# Patient Record
Sex: Male | Born: 1955
Health system: Southern US, Community
[De-identification: ages and names within clinical notes are randomized; demographics above are authoritative.]

## PROBLEM LIST (undated history)

## (undated) DIAGNOSIS — F411 Generalized anxiety disorder: Secondary | ICD-10-CM

## (undated) DIAGNOSIS — E785 Hyperlipidemia, unspecified: Secondary | ICD-10-CM

## (undated) DIAGNOSIS — F17200 Nicotine dependence, unspecified, uncomplicated: Secondary | ICD-10-CM

## (undated) DIAGNOSIS — K219 Gastro-esophageal reflux disease without esophagitis: Secondary | ICD-10-CM

## (undated) DIAGNOSIS — M549 Dorsalgia, unspecified: Secondary | ICD-10-CM

## (undated) DIAGNOSIS — L4050 Arthropathic psoriasis, unspecified: Secondary | ICD-10-CM

## (undated) DIAGNOSIS — D369 Benign neoplasm, unspecified site: Secondary | ICD-10-CM

## (undated) DIAGNOSIS — R739 Hyperglycemia, unspecified: Secondary | ICD-10-CM

## (undated) DIAGNOSIS — Z86718 Personal history of other venous thrombosis and embolism: Secondary | ICD-10-CM

## (undated) DIAGNOSIS — I509 Heart failure, unspecified: Secondary | ICD-10-CM

## (undated) DIAGNOSIS — Z7289 Other problems related to lifestyle: Secondary | ICD-10-CM

## (undated) DIAGNOSIS — J309 Allergic rhinitis, unspecified: Secondary | ICD-10-CM

## (undated) DIAGNOSIS — N2889 Other specified disorders of kidney and ureter: Secondary | ICD-10-CM

## (undated) DIAGNOSIS — Z86010 Personal history of colonic polyps: Secondary | ICD-10-CM

## (undated) DIAGNOSIS — M5137 Other intervertebral disc degeneration, lumbosacral region: Secondary | ICD-10-CM

## (undated) DIAGNOSIS — R21 Rash and other nonspecific skin eruption: Secondary | ICD-10-CM

## (undated) DIAGNOSIS — I10 Essential (primary) hypertension: Secondary | ICD-10-CM

## (undated) DIAGNOSIS — L408 Other psoriasis: Secondary | ICD-10-CM

## (undated) DIAGNOSIS — Z85528 Personal history of other malignant neoplasm of kidney: Secondary | ICD-10-CM

## (undated) HISTORY — DX: Gastro-esophageal reflux disease without esophagitis: K21.9

## (undated) HISTORY — DX: Allergic rhinitis, unspecified: J30.9

## (undated) HISTORY — PX: OTHER SURGICAL HISTORY: SHX169

## (undated) HISTORY — DX: Generalized anxiety disorder: F41.1

## (undated) HISTORY — PX: WISDOM TOOTH EXTRACTION: SHX21

## (undated) HISTORY — DX: Dorsalgia, unspecified: M54.9

## (undated) HISTORY — DX: Rash and other nonspecific skin eruption: R21

## (undated) HISTORY — DX: Hyperlipidemia, unspecified: E78.5

## (undated) HISTORY — DX: Hyperglycemia, unspecified: R73.9

## (undated) HISTORY — DX: Benign neoplasm, unspecified site: D36.9

## (undated) HISTORY — DX: Essential (primary) hypertension: I10

## (undated) HISTORY — DX: Personal history of other venous thrombosis and embolism: Z86.718

## (undated) HISTORY — DX: Other intervertebral disc degeneration, lumbosacral region: M51.37

## (undated) HISTORY — DX: Personal history of colonic polyps: Z86.010

## (undated) HISTORY — DX: Other problems related to lifestyle: Z72.89

## (undated) HISTORY — DX: Other psoriasis: L40.8

## (undated) HISTORY — DX: Nicotine dependence, unspecified, uncomplicated: F17.200

---

## 2005-03-30 ENCOUNTER — Ambulatory Visit: Payer: Self-pay | Admitting: Internal Medicine

## 2005-03-30 ENCOUNTER — Ambulatory Visit (HOSPITAL_COMMUNITY): Admission: RE | Admit: 2005-03-30 | Discharge: 2005-03-30 | Payer: Self-pay | Admitting: Internal Medicine

## 2005-04-12 ENCOUNTER — Ambulatory Visit: Payer: Self-pay

## 2005-04-15 ENCOUNTER — Ambulatory Visit: Payer: Self-pay | Admitting: Internal Medicine

## 2005-06-09 ENCOUNTER — Emergency Department (HOSPITAL_COMMUNITY): Admission: EM | Admit: 2005-06-09 | Discharge: 2005-06-09 | Payer: Self-pay | Admitting: Emergency Medicine

## 2007-06-21 ENCOUNTER — Ambulatory Visit: Payer: Self-pay | Admitting: Internal Medicine

## 2007-06-21 LAB — CONVERTED CEMR LAB
ALT: 37 units/L (ref 0–53)
AST: 30 units/L (ref 0–37)
Albumin: 4 g/dL (ref 3.5–5.2)
Alkaline Phosphatase: 97 units/L (ref 39–117)
BUN: 15 mg/dL (ref 6–23)
Basophils Absolute: 0.1 10*3/uL (ref 0.0–0.1)
Basophils Relative: 1.1 % — ABNORMAL HIGH (ref 0.0–1.0)
Bilirubin Urine: NEGATIVE
Bilirubin, Direct: 0.1 mg/dL (ref 0.0–0.3)
CO2: 29 meq/L (ref 19–32)
Calcium: 9.9 mg/dL (ref 8.4–10.5)
Chloride: 107 meq/L (ref 96–112)
Cholesterol: 188 mg/dL (ref 0–200)
Creatinine, Ser: 0.8 mg/dL (ref 0.4–1.5)
Eosinophils Absolute: 0.1 10*3/uL (ref 0.0–0.6)
Eosinophils Relative: 1.6 % (ref 0.0–5.0)
GFR calc Af Amer: 132 mL/min
GFR calc non Af Amer: 109 mL/min
Glucose, Bld: 100 mg/dL — ABNORMAL HIGH (ref 70–99)
HCT: 42.8 % (ref 39.0–52.0)
HDL: 57.9 mg/dL (ref >39.0)
Hemoglobin, Urine: NEGATIVE
Hemoglobin: 15.3 g/dL (ref 13.0–17.0)
Ketones, ur: NEGATIVE mg/dL
LDL Cholesterol: 115 mg/dL — ABNORMAL HIGH (ref 0–99)
Leukocytes, UA: NEGATIVE
Lymphocytes Relative: 29.3 % (ref 12.0–46.0)
MCHC: 35.8 g/dL (ref 30.0–36.0)
MCV: 92.4 fL (ref 78.0–100.0)
Monocytes Absolute: 0.5 10*3/uL (ref 0.2–0.7)
Monocytes Relative: 8.7 % (ref 3.0–11.0)
Neutro Abs: 3.8 10*3/uL (ref 1.4–7.7)
Neutrophils Relative %: 59.3 % (ref 43.0–77.0)
Nitrite: NEGATIVE
PSA: 0.57 ng/mL
PSA: 0.57 ng/mL (ref 0.10–4.00)
Platelets: 235 10*3/uL (ref 150–400)
Potassium: 5.1 meq/L (ref 3.5–5.1)
RBC: 4.63 M/uL (ref 4.22–5.81)
RDW: 11.6 % (ref 11.5–14.6)
Sodium: 141 meq/L (ref 135–145)
Specific Gravity, Urine: >=1.03 (ref 1.000–1.03)
TSH: 1.76 microintl units/mL (ref 0.35–5.50)
Total Bilirubin: 0.7 mg/dL (ref 0.3–1.2)
Total CHOL/HDL Ratio: 3.2
Total Protein, Urine: NEGATIVE mg/dL
Total Protein: 6.7 g/dL (ref 6.0–8.3)
Triglycerides: 74 mg/dL (ref 0–149)
Urine Glucose: NEGATIVE mg/dL
Urobilinogen, UA: 0.2 (ref 0.0–1.0)
VLDL: 15 mg/dL (ref 0–40)
WBC: 6.3 10*3/uL (ref 4.5–10.5)
pH: 5.5 (ref 5.0–8.0)

## 2007-08-29 ENCOUNTER — Ambulatory Visit: Payer: Self-pay | Admitting: Internal Medicine

## 2007-09-10 ENCOUNTER — Encounter: Payer: Self-pay | Admitting: Internal Medicine

## 2007-09-10 DIAGNOSIS — Z86718 Personal history of other venous thrombosis and embolism: Secondary | ICD-10-CM

## 2007-09-10 DIAGNOSIS — F411 Generalized anxiety disorder: Secondary | ICD-10-CM

## 2007-09-10 DIAGNOSIS — J309 Allergic rhinitis, unspecified: Secondary | ICD-10-CM

## 2007-09-10 DIAGNOSIS — G4700 Insomnia, unspecified: Secondary | ICD-10-CM | POA: Insufficient documentation

## 2007-09-10 HISTORY — DX: Allergic rhinitis, unspecified: J30.9

## 2007-09-10 HISTORY — DX: Personal history of other venous thrombosis and embolism: Z86.718

## 2007-09-10 HISTORY — DX: Generalized anxiety disorder: F41.1

## 2008-01-18 ENCOUNTER — Ambulatory Visit: Payer: Self-pay | Admitting: Internal Medicine

## 2008-01-28 ENCOUNTER — Encounter: Payer: Self-pay | Admitting: Internal Medicine

## 2008-01-29 ENCOUNTER — Ambulatory Visit: Payer: Self-pay | Admitting: Internal Medicine

## 2008-01-29 ENCOUNTER — Encounter: Payer: Self-pay | Admitting: Internal Medicine

## 2008-01-29 DIAGNOSIS — Z860100 Personal history of colon polyps, unspecified: Secondary | ICD-10-CM | POA: Insufficient documentation

## 2008-01-29 HISTORY — DX: Personal history of colonic polyps: Z86.010

## 2008-01-29 HISTORY — DX: Personal history of colon polyps, unspecified: Z86.0100

## 2008-11-22 HISTORY — PX: ROTATOR CUFF REPAIR: SHX139

## 2009-04-23 ENCOUNTER — Ambulatory Visit: Payer: Self-pay | Admitting: Internal Medicine

## 2009-04-23 DIAGNOSIS — R21 Rash and other nonspecific skin eruption: Secondary | ICD-10-CM

## 2009-04-23 HISTORY — DX: Rash and other nonspecific skin eruption: R21

## 2009-04-24 LAB — CONVERTED CEMR LAB
ALT: 26 units/L (ref 0–53)
AST: 24 units/L (ref 0–37)
Albumin: 4 g/dL (ref 3.5–5.2)
Alkaline Phosphatase: 88 units/L (ref 39–117)
BUN: 16 mg/dL (ref 6–23)
Basophils Absolute: 0 10*3/uL (ref 0.0–0.1)
Basophils Relative: 0.7 % (ref 0.0–3.0)
Bilirubin Urine: NEGATIVE
Bilirubin, Direct: 0.2 mg/dL (ref 0.0–0.3)
CO2: 29 meq/L (ref 19–32)
Calcium: 9.2 mg/dL (ref 8.4–10.5)
Chloride: 111 meq/L (ref 96–112)
Cholesterol: 223 mg/dL — ABNORMAL HIGH (ref 0–200)
Creatinine, Ser: 0.7 mg/dL (ref 0.4–1.5)
Direct LDL: 130.5 mg/dL
Eosinophils Absolute: 0.2 10*3/uL (ref 0.0–0.7)
Eosinophils Relative: 2.6 % (ref 0.0–5.0)
GFR calc non Af Amer: 125.9 mL/min (ref >60)
Glucose, Bld: 96 mg/dL (ref 70–99)
HCT: 41.5 % (ref 39.0–52.0)
HDL: 52.5 mg/dL (ref >39.00)
Hemoglobin, Urine: NEGATIVE
Hemoglobin: 14.7 g/dL (ref 13.0–17.0)
Leukocytes, UA: NEGATIVE
Lymphocytes Relative: 41.4 % (ref 12.0–46.0)
Lymphs Abs: 2.8 10*3/uL (ref 0.7–4.0)
MCHC: 35.4 g/dL (ref 30.0–36.0)
MCV: 94.3 fL (ref 78.0–100.0)
Monocytes Absolute: 0.6 10*3/uL (ref 0.1–1.0)
Monocytes Relative: 8.4 % (ref 3.0–12.0)
Neutro Abs: 3.2 10*3/uL (ref 1.4–7.7)
Neutrophils Relative %: 46.9 % (ref 43.0–77.0)
Nitrite: NEGATIVE
PSA: 0.49 ng/mL (ref 0.10–4.00)
Platelets: 202 10*3/uL (ref 150.0–400.0)
Potassium: 4.1 meq/L (ref 3.5–5.1)
RBC: 4.4 M/uL (ref 4.22–5.81)
RDW: 11.8 % (ref 11.5–14.6)
Sodium: 145 meq/L (ref 135–145)
Specific Gravity, Urine: >=1.03 (ref 1.000–1.030)
TSH: 2.6 microintl units/mL (ref 0.35–5.50)
Total Bilirubin: 0.7 mg/dL (ref 0.3–1.2)
Total CHOL/HDL Ratio: 4
Total Protein, Urine: NEGATIVE mg/dL
Total Protein: 6.8 g/dL (ref 6.0–8.3)
Triglycerides: 215 mg/dL — ABNORMAL HIGH (ref 0.0–149.0)
Urine Glucose: NEGATIVE mg/dL
Urobilinogen, UA: 0.2 (ref 0.0–1.0)
VLDL: 43 mg/dL — ABNORMAL HIGH (ref 0.0–40.0)
WBC: 6.8 10*3/uL (ref 4.5–10.5)
pH: 6 (ref 5.0–8.0)

## 2010-03-12 ENCOUNTER — Telehealth: Payer: Self-pay | Admitting: Internal Medicine

## 2010-05-01 ENCOUNTER — Ambulatory Visit: Payer: Self-pay | Admitting: Internal Medicine

## 2010-12-22 NOTE — Progress Notes (Signed)
Summary: Med REfill  Phone Note Refill Request  on March 12, 2010 2:54 PM  Refills Requested: Medication #1:  ALPRAZOLAM 1 MG TABS 1 by mouth once daily as needed   Dosage confirmed as above?Dosage Confirmed   Notes: [facility] FAx#[phone] Initial call taken by: Scharlene Gloss,  March 12, 2010 2:55 PM  Follow-up for Phone Call        done hardcopy to [facility] side B - dahlia  Follow-up by: Corwin Levins MD,  March 12, 2010 3:27 PM  Additional Follow-up for Phone Call Additional follow up Details #1::        RX faxed to pharmacy Additional Follow-up by: Margaret Pyle, CMA,  March 12, 2010 3:33 PM    New/Updated Medications: ALPRAZOLAM 1 MG TABS (ALPRAZOLAM) 1 by mouth once daily as needed - needs return office visit for further refills please Prescriptions: ALPRAZOLAM 1 MG TABS (ALPRAZOLAM) 1 by mouth once daily as needed - needs return office visit for further refills please  #30 x 1   Entered and Authorized by:   Corwin Levins MD   Signed by:   Corwin Levins MD on 03/12/2010   Method used:   Print then Give to Patient   RxID:   [id number]

## 2010-12-22 NOTE — Op Note (Signed)
Summary: COLONOSCOPY    Colonoscopy  Procedure date:  01/29/2008  Findings:      1) THREE (3) RECTAL POLYPS REMOVED 2) INTERNAL HEMORRHOIDS 3) OTHERWISE NORMAL WITH VERY GOOD PREP  Comments:      Further recommendations pending biopsy results.    Colonoscopy  Procedure date:  01/29/2008  Comments:      Path = adenomatous polyps Repeat colonoscopy in 5 years.   Procedures Next Due Date:    Colonoscopy: 01/2013

## 2010-12-22 NOTE — Procedures (Signed)
Summary: Colonoscopy Report/[facility]  Colonoscopy Report/[facility]   Imported By: Esmeralda Links D'jimraou 02/02/2008 13:32:11  _____________________________________________________________________  External Attachment:    Type:   Image     Comment:   External Document

## 2010-12-31 ENCOUNTER — Encounter: Payer: Self-pay | Admitting: Internal Medicine

## 2010-12-31 ENCOUNTER — Ambulatory Visit (INDEPENDENT_AMBULATORY_CARE_PROVIDER_SITE_OTHER): Payer: BC Managed Care – PPO | Admitting: Internal Medicine

## 2010-12-31 DIAGNOSIS — M549 Dorsalgia, unspecified: Secondary | ICD-10-CM

## 2010-12-31 DIAGNOSIS — L408 Other psoriasis: Secondary | ICD-10-CM

## 2010-12-31 DIAGNOSIS — M5137 Other intervertebral disc degeneration, lumbosacral region: Secondary | ICD-10-CM | POA: Insufficient documentation

## 2010-12-31 DIAGNOSIS — E785 Hyperlipidemia, unspecified: Secondary | ICD-10-CM | POA: Insufficient documentation

## 2010-12-31 DIAGNOSIS — F17200 Nicotine dependence, unspecified, uncomplicated: Secondary | ICD-10-CM

## 2010-12-31 DIAGNOSIS — L409 Psoriasis, unspecified: Secondary | ICD-10-CM | POA: Insufficient documentation

## 2010-12-31 DIAGNOSIS — M51379 Other intervertebral disc degeneration, lumbosacral region without mention of lumbar back pain or lower extremity pain: Secondary | ICD-10-CM

## 2010-12-31 DIAGNOSIS — Z0000 Encounter for general adult medical examination without abnormal findings: Secondary | ICD-10-CM

## 2010-12-31 HISTORY — DX: Other intervertebral disc degeneration, lumbosacral region without mention of lumbar back pain or lower extremity pain: M51.379

## 2010-12-31 HISTORY — DX: Dorsalgia, unspecified: M54.9

## 2010-12-31 HISTORY — DX: Other psoriasis: L40.8

## 2010-12-31 HISTORY — DX: Other intervertebral disc degeneration, lumbosacral region: M51.37

## 2010-12-31 HISTORY — DX: Hyperlipidemia, unspecified: E78.5

## 2010-12-31 HISTORY — DX: Nicotine dependence, unspecified, uncomplicated: F17.200

## 2011-01-07 NOTE — Assessment & Plan Note (Signed)
Summary: DISCUSS STOPPING SMOKING/PSORISIS/NWS   Vital Signs:  Patient profile:   55 year old male Height:      74 inches Weight:      200 pounds BMI:     25.77 O2 Sat:      97 % on Room air Temp:     97.8 degrees F oral Pulse rate:   83 / minute BP sitting:   102 / 78  (left arm) Cuff size:   large  Vitals Entered By: Zella Ball Ewing CMA (AAMA) (December 31, 2010 2:48 PM)  O2 Flow:  Room air  CC: Discuss smoking, back pain/RE   CC:  Discuss smoking and back pain/RE.  History of Present Illness: here for wellness and f/u; Pt denies CP, worsening sob, doe, wheezing, orthopnea, pnd, worsening LE edema, palps, dizziness or syncope  Pt denies new neuro symptoms such as headache, facial or extremity weakness  Pt denies polydipsia, polyuria   Overall good compliance with meds, trying to follow low chol diet, wt stable, little excercise however  Overall good compliance with meds, and good tolerability.  No fever, wt loss, night sweats, loss of appetite or other constitutional symptoms  Denies worsening depressive symptoms, suicidal ideation, or panic, though has ongoing anxiety.   Pt states good ability with ADL's, low fall risk, home safety reviewed and adequate, no significant change in hearing or vision, trying to follow lower chol diet, and occasionally active only with regular excercise.  Does have psoriatic plaque more active to knees in the past 3 mo.  Needs med refills.  Incidetnly also with right lower back pain x 2 dyas similar to prior episodes after cutting wood, without LE pain/weak/numb, gait change, fall, injury, fever., bowel or bladder chagne.   Wants to quit smoking as well.    Preventive Screening-Counseling & Management  Alcohol-Tobacco     Smoking Status: current      Drug Use:  no.    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 1 By Mouth Once Daily As Needed - Needs Return Office Visit For Further Refills Please 2)  Prednisone 10 Mg Tabs (Prednisone) .... 4po Qd For 3days, Then 3po Qd For 3days, Then 2po Qd For 3days, Then 1po Qd For 3 Days, Then Stop  Current Medications (verified): 1)  Alprazolam 1 Mg Tabs (Alprazolam) .Marland Kitchen.. 1 By Mouth Once Daily As Needed 2)  Triamcinolone Acetonide 0.1 % Crea (Triamcinolone Acetonide) .... Use Asd Two Times A Day As Needed 3)  Aspir-Low 81 Mg Tbec (Aspirin) .Marland Kitchen.. 1po Once Daily 4)  Hydrocodone-Acetaminophen 5-325 Mg Tabs (Hydrocodone-Acetaminophen) .Marland Kitchen.. 1 By Mouth Once Daily 6 Hrs As Needed 5)  Chantix Continuing Month Pak 1 Mg Tabs (Varenicline Tartrate) .... 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 PPD (  09/10/2007)  Past Medical History: Hx of Pulmonary Embolus Anxiety Allergic rhinitis Insomnia Hyperlipidemia lumbar disc disease - [facility]  Past Surgical History: Spleen Repair Rotator cuff repair - left - jan 2010  - [facility]  Family History: Reviewed history from 04/23/2009 and no changes required. father with melanoma  Social History: Reviewed history from 04/23/2009 and no changes required. 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         all otherwise negative per pt -    Physical Exam  General:  alert and well-developed.   Head:  normocephalic and atraumatic.   Eyes:  vision grossly intact, pupils equal, and pupils round.   Ears:  R ear normal and L ear normal.   Nose:  no external deformity and no nasal discharge.   Mouth:  no gingival abnormalities and pharynx pink and moist.   Neck:  supple and no masses.   Lungs:  normal respiratory effort and normal breath sounds.   Heart:  normal rate and regular rhythm.   Abdomen:  soft, non-tender, and normal bowel sounds.   Msk:  normal ROM, no joint tenderness, and no joint swelling.   spine nontender, has mild ot mod right lumbar paraspinal tender Extremities:  no edema, no ulcers  Neurologic:  cranial nerves II-XII intact and strength normal in all extremities.   Skin:  color normal.  witih psoriatic rash to extensor surfaces - elbows/knees Psych:  not depressed appearing and moderately anxious.     Impression & Recommendations:  Problem # 1:  Preventive Health Care (ICD-V70.0) Overall doing well, age appropriate education and counseling updated, referral for preventive services and immunizations addressed, dietary counseling and smoking status adressed , most recent labs reviewed I have personally reviewed and have noted 1.The patient's medical and social history 2.Their use of alcohol, tobacco or illicit drugs 3.Their current medications and supplements 4. Functional ability including ADL's, fall risk, home safety risk,  hearing & visual impairment  5.Diet and physical activities 6.Evidence for depression or mood disorders The patients weight, height, BMI  have been recorded in the chart I have made referrals, counseling and provided education to the patient based review of the above  Orders: TLB-BMP (Basic Metabolic Panel-BMET) (80048-METABOL) TLB-CBC Platelet - w/Differential (85025-CBCD) TLB-Hepatic/Liver Function Pnl (80076-HEPATIC) TLB-Lipid Panel (80061-LIPID) TLB-PSA (Prostate Specific Antigen) (84153-PSA) TLB-TSH (Thyroid Stimulating Hormone) (84443-TSH) TLB-Udip ONLY (81003-UDIP)  Problem # 2:  BACK PAIN (ICD-724.5)  His updated medication list for this problem includes:    Aspir-low 81 Mg Tbec (Aspirin) .Marland Kitchen... 1po once daily    Hydrocodone-acetaminophen 5-325 Mg Tabs (Hydrocodone-acetaminophen) .Marland Kitchen... 1 by mouth once daily 6 hrs as needed c/w strain - for limited vicodin as needed   Problem # 3:  PSORIASIS (ICD-696.1) for triam cr, to derm if not improve  Problem # 4:  HYPERLIPIDEMIA (ICD-272.4)  Pt to continue diet efforts,  to check labs - goal LDL less than 100  Labs Reviewed: SGOT: 24 (04/23/2009)   SGPT: 26 (04/23/2009)   HDL:52.50 (04/23/2009), 57.9 (06/21/2007)  LDL:115 (06/21/2007)  Chol:223 (04/23/2009), 188 (06/21/2007)  Trig:215.0 (  04/23/2009), 74 (06/21/2007)  Problem # 5:  ANXIETY (ICD-300.00)  His updated medication list for this problem includes:    Alprazolam 1 Mg Tabs (Alprazolam) .Marland Kitchen... 1 by mouth once daily as needed stable overall by hx and exam, ok to continue meds/tx as is   Discussed medication use and relaxation techniques.   Problem # 6:  SMOKER (ICD-305.1)  His updated medication list for this problem includes:    Chantix Continuing Month Pak 1 Mg Tabs (Varenicline tartrate) ..... Use asd 1 by mouth once daily treat as above, f/u any worsening signs or symptoms   Complete Medication List: 1)  Alprazolam 1 Mg Tabs (Alprazolam) .Marland Kitchen.. 1 by mouth once daily  as needed 2)  Triamcinolone Acetonide 0.1 % Crea (Triamcinolone acetonide) .... Use asd two times a day as needed 3)  Aspir-low 81 Mg Tbec (Aspirin) .Marland Kitchen.. 1po once daily 4)  Hydrocodone-acetaminophen 5-325 Mg Tabs (Hydrocodone-acetaminophen) .Marland Kitchen.. 1 by mouth once daily 6 hrs as needed 5)  Chantix Continuing Month Pak 1 Mg Tabs (Varenicline tartrate) .... Use asd 1 by mouth once daily  Patient Instructions: 1)  Please go to the Lab in the basement for your blood and/or urine tests today 2)  Please call the number on the [facility] Card for results of your testing  3)  you had the tetanus shot today 4)  Take an Aspirin every day - 81 mg - 1 per day 5)  Please take all new medications as prescribed  6)  Continue all previous medications as before this visit  7)  Please schedule a follow-up appointment in 1 year or sooner if needed Prescriptions: CHANTIX CONTINUING MONTH PAK 1 MG TABS (VARENICLINE TARTRATE) use asd 1 by mouth once daily  #1pk x 1   Entered and Authorized by:   Corwin Levins MD   Signed by:   Corwin Levins MD on 12/31/2010   Method used:   Print then Give to Patient   RxID:   [id number] CHANTIX STARTING MONTH PAK 0.5 MG X 11 & 1 MG X 42 TABS (VARENICLINE TARTRATE) use asd 1 by mouth once daily  #1pk x 0   Entered and Authorized by:   Corwin Levins MD   Signed by:   Corwin Levins MD on 12/31/2010   Method used:   Print then Give to Patient   RxID:   [id number] HYDROCODONE-ACETAMINOPHEN 5-325 MG TABS (HYDROCODONE-ACETAMINOPHEN) 1 by mouth once daily 6 hrs as needed  #15 x 0   Entered and Authorized by:   Corwin Levins MD   Signed by:   Corwin Levins MD on 12/31/2010   Method used:   Print then Give to Patient   RxID:   [id number] TRIAMCINOLONE ACETONIDE 0.1 % CREA (TRIAMCINOLONE ACETONIDE) use asd two times a day as needed  #1large x 1   Entered and Authorized by:   Corwin Levins MD   Signed by:   Corwin Levins MD on 12/31/2010   Method used:   Print then Give to  Patient   RxID:   [id number] ALPRAZOLAM 1 MG TABS (ALPRAZOLAM) 1 by mouth once daily as needed  #30 x 5   Entered and Authorized by:   Corwin Levins MD   Signed by:   Corwin Levins MD on 12/31/2010   Method used:   Print then Give to Patient   RxID:   [id number]    Orders Added: 1)  TLB-BMP (Basic Metabolic Panel-BMET) [80048-METABOL] 2)  TLB-CBC Platelet - w/Differential [85025-CBCD] 3)  TLB-Hepatic/Liver Function Pnl [80076-HEPATIC] 4)  TLB-Lipid Panel [80061-LIPID] 5)  TLB-PSA (Prostate Specific Antigen) [84153-PSA] 6)  TLB-TSH (Thyroid Stimulating Hormone) [84443-TSH] 7)  TLB-Udip ONLY [81003-UDIP] 8)  Est. Patient 40-64 years [14782]

## 2011-11-17 ENCOUNTER — Ambulatory Visit (INDEPENDENT_AMBULATORY_CARE_PROVIDER_SITE_OTHER): Payer: BC Managed Care – PPO | Admitting: Internal Medicine

## 2011-11-17 ENCOUNTER — Encounter: Payer: Self-pay | Admitting: Internal Medicine

## 2011-11-17 VITALS — BP 102/62 | HR 104 | Temp 99.8°F | Ht 74.0 in | Wt 198.5 lb

## 2011-11-17 DIAGNOSIS — R062 Wheezing: Secondary | ICD-10-CM

## 2011-11-17 DIAGNOSIS — F1090 Alcohol use, unspecified, uncomplicated: Secondary | ICD-10-CM

## 2011-11-17 DIAGNOSIS — F411 Generalized anxiety disorder: Secondary | ICD-10-CM

## 2011-11-17 DIAGNOSIS — J209 Acute bronchitis, unspecified: Secondary | ICD-10-CM

## 2011-11-17 DIAGNOSIS — Z0001 Encounter for general adult medical examination with abnormal findings: Secondary | ICD-10-CM | POA: Insufficient documentation

## 2011-11-17 DIAGNOSIS — Z0000 Encounter for general adult medical examination without abnormal findings: Secondary | ICD-10-CM

## 2011-11-17 DIAGNOSIS — Z7289 Other problems related to lifestyle: Secondary | ICD-10-CM

## 2011-11-17 DIAGNOSIS — D369 Benign neoplasm, unspecified site: Secondary | ICD-10-CM

## 2011-11-17 HISTORY — DX: Other problems related to lifestyle: Z72.89

## 2011-11-17 HISTORY — DX: Alcohol use, unspecified, uncomplicated: F10.90

## 2011-11-17 HISTORY — DX: Benign neoplasm, unspecified site: D36.9

## 2011-11-17 MED ORDER — ALPRAZOLAM 1 MG PO TABS: 1.0000 mg | ORAL_TABLET | Freq: Every evening | ORAL | Status: DC | PRN

## 2011-11-17 MED ORDER — TRIAMCINOLONE ACETONIDE 0.1 % EX CREA: TOPICAL_CREAM | Freq: Two times a day (BID) | CUTANEOUS | Status: DC

## 2011-11-17 MED ORDER — LEVOFLOXACIN 250 MG PO TABS: 250.0000 mg | ORAL_TABLET | Freq: Every day | ORAL | Status: AC

## 2011-11-17 MED ORDER — PREDNISONE 10 MG PO TABS: 10.0000 mg | ORAL_TABLET | Freq: Every day | ORAL | Status: DC

## 2011-11-17 MED ORDER — HYDROCODONE-HOMATROPINE 5-1.5 MG/5ML PO SYRP: 5.0000 mL | ORAL_SOLUTION | Freq: Four times a day (QID) | ORAL | Status: AC | PRN

## 2011-11-17 MED ORDER — HYDROCODONE-ACETAMINOPHEN 5-325 MG PO TABS: 1.0000 | ORAL_TABLET | Freq: Every day | ORAL | Status: DC | PRN

## 2011-11-17 NOTE — Patient Instructions (Addendum)
Take all new medications as prescribed - the antibiotic, cough medicine, and prednisone (the cough med is given hardcopy. The other 2 sent to pharmacy) Continue all other medications as before - the xanax, norco and kenalog (the kenalog sent to the pharmacy) Please go to LAB in the Basement for the blood and/or urine tests to be done today Please call the phone number [phone] (the [employer] System) for results of testing in 2-3 days;  When calling, simply dial the number, and when prompted enter the MRN number above (the Medical Record Number) and the # key, then the message should start. Please moderate your alcohol use Please return in 1 year for your yearly visit, or sooner if needed, with Lab testing done 3-5 days before

## 2011-11-18 ENCOUNTER — Encounter: Payer: Self-pay | Admitting: Internal Medicine

## 2011-11-18 NOTE — Assessment & Plan Note (Signed)
Mild to mod, for predpack  course,  to f/u any worsening symptoms or concerns 

## 2011-11-18 NOTE — Assessment & Plan Note (Signed)
Chronic mild persistent, for med refill, o/w stable overall by hx and exam, and pt to continue medical treatment as before

## 2011-11-18 NOTE — Assessment & Plan Note (Signed)
Mild to mod, for antibx course,  to f/u any worsening symptoms or concerns 

## 2011-11-18 NOTE — Progress Notes (Signed)
Subjective:    Patient ID: Randy Walker, male    DOB: 03-19-1956, 55 y.o.   MRN: [id number]  HPI Here for wellness and f/u;  Overall doing ok;  Pt denies CP, worsening SOB, DOE, wheezing, orthopnea, PND, worsening LE edema, palpitations, dizziness or syncope except Here with acute onset mild to mod 2-3 days ST, HA, general weakness and malaise, with prod cough greenish sputum, and mild wheezing onset today.  Pt denies neurological change such as new Headache, facial or extremity weakness.  Pt denies polydipsia, polyuria, or low sugar symptoms. Pt states overall good compliance with treatment and medications, good tolerability, and trying to follow lower cholesterol diet.  Pt denies worsening depressive symptoms, suicidal ideation or panic. No fever, wt loss, night sweats, loss of appetite, or other constitutional symptoms.  Pt states good ability with ADL's, low fall risk, home safety reviewed and adequate, no significant changes in hearing or vision, and occasionally active with exercise. No other acute complaints Past Medical History  Diagnosis Date  . ALLERGIC RHINITIS 09/10/2007  . ANXIETY 09/10/2007  . BACK PAIN 12/31/2010  . DISC DISEASE, LUMBAR 12/31/2010  . HYPERLIPIDEMIA 12/31/2010  . PSORIASIS 12/31/2010  . PULMONARY EMBOLISM, HX OF 09/10/2007  . RASH-NONVESICULAR 04/23/2009  . SMOKER 12/31/2010  . Adenomatous polyps 11/17/2011  . Alcohol use 11/17/2011   Past Surgical History  Procedure Date  . Spleen repair   . Rotator cuff repair Jan, 2010    [facility], Left    reports that he has been smoking.  He does not have any smokeless tobacco history on file. He reports that he drinks alcohol. He reports that he does not use illicit drugs. family history is not on file. No Known Allergies No current outpatient prescriptions on file prior to visit.   Review of Systems Review of Systems  Constitutional: Negative for diaphoresis, activity change, appetite change and unexpected weight change.    HENT: Negative for hearing loss, ear pain, facial swelling, mouth sores and neck stiffness.   Eyes: Negative for pain, redness and visual disturbance.  Respiratory: Negative for shortness of breath and wheezing.   Cardiovascular: Negative for chest pain and palpitations.  Gastrointestinal: Negative for diarrhea, blood in stool, abdominal distention and rectal pain.  Genitourinary: Negative for hematuria, flank pain and decreased urine volume.  Musculoskeletal: Negative for myalgias and joint swelling.  Skin: Negative for color change and wound.  Neurological: Negative for syncope and numbness.  Hematological: Negative for adenopathy.  Psychiatric/Behavioral: Negative for hallucinations, self-injury, decreased concentration and agitation.      Objective:   Physical Exam BP 102/62  Pulse 104  Temp(Src) 99.8 F (37.7 C) (Oral)  Ht 6\' 2"  (1.88 m)  Wt 198 lb 8 oz (90.039 kg)  BMI 25.49 kg/m2  SpO2 93% Physical Exam  VS noted, mild ill Constitutional: Pt is oriented to person, place, and time. Appears well-developed and well-nourished.  HENT:  Head: Normocephalic and atraumatic.  Right Ear: External ear normal.  Left Ear: External ear normal.  Nose: Nose normal.  Mouth/Throat: Oropharynx is clear and moist. Bilat tm's mild erythema.  Sinus nontender.  Pharynx mild erythema  Eyes: Conjunctivae and EOM are normal. Pupils are equal, round, and reactive to light.  Neck: Normal range of motion. Neck supple. No JVD present. No tracheal deviation present.  Cardiovascular: Normal rate, regular rhythm, normal heart sounds and intact distal pulses.   Pulmonary/Chest: Effort normal but breath sounds mild decreased with mild bilat wheeze  Abdominal: Soft. Bowel sounds  are normal. There is no tenderness.  Musculoskeletal: Normal range of motion. Exhibits no edema.  Lymphadenopathy:  Has no cervical adenopathy.  Neurological: Pt is alert and oriented to person, place, and time. Pt has normal  reflexes. No cranial nerve deficit.  Skin: Skin is warm and dry. No rash noted.  Psychiatric:  Has  normal mood and affect. Behavior is normal. 1+ nervous    Assessment & Plan:

## 2011-11-18 NOTE — Assessment & Plan Note (Signed)
4 drinks per day, suspicious for overuse, advised to moderate use,  to f/u any worsening symptoms or concerns

## 2011-11-18 NOTE — Assessment & Plan Note (Signed)

## 2012-05-24 ENCOUNTER — Other Ambulatory Visit: Payer: Self-pay

## 2012-05-24 MED ORDER — HYDROCODONE-ACETAMINOPHEN 5-325 MG PO TABS: 1.0000 | ORAL_TABLET | Freq: Every day | ORAL | Status: DC | PRN

## 2012-05-24 MED ORDER — ALPRAZOLAM 1 MG PO TABS: 1.0000 mg | ORAL_TABLET | Freq: Every evening | ORAL | Status: DC | PRN

## 2012-05-24 NOTE — Telephone Encounter (Signed)
Faxed hardcopy to pharmacy. 

## 2012-05-24 NOTE — Telephone Encounter (Signed)
Done hardcopy to robin  

## 2012-11-27 ENCOUNTER — Other Ambulatory Visit: Payer: Self-pay

## 2012-11-27 MED ORDER — ALPRAZOLAM 1 MG PO TABS: 1.0000 mg | ORAL_TABLET | Freq: Every evening | ORAL | Status: DC | PRN

## 2012-11-27 MED ORDER — HYDROCODONE-ACETAMINOPHEN 5-325 MG PO TABS: 1.0000 | ORAL_TABLET | Freq: Every day | ORAL | Status: DC | PRN

## 2012-11-27 NOTE — Telephone Encounter (Signed)
Done hardcopy to robin  Due for ROV 

## 2012-11-28 NOTE — Telephone Encounter (Signed)
Faxed hardcopy to pharmacy. 

## 2013-01-02 ENCOUNTER — Other Ambulatory Visit: Payer: Self-pay

## 2013-01-02 NOTE — Telephone Encounter (Signed)
Pain med denied  Needs OV

## 2013-01-03 NOTE — Telephone Encounter (Signed)
Patient informed. 

## 2013-01-08 ENCOUNTER — Other Ambulatory Visit: Payer: Self-pay

## 2013-01-08 MED ORDER — HYDROCODONE-ACETAMINOPHEN 5-325 MG PO TABS: 1.0000 | ORAL_TABLET | Freq: Every day | ORAL | Status: DC | PRN

## 2013-01-08 NOTE — Telephone Encounter (Signed)
Faxed hardcopy to pharmacy and called the patient to inform to schedule appointment.

## 2013-01-08 NOTE — Telephone Encounter (Signed)
Done hardcopy to robin  Due for ROV 

## 2013-02-01 ENCOUNTER — Encounter: Payer: Self-pay | Admitting: Internal Medicine

## 2013-02-02 ENCOUNTER — Encounter: Payer: Self-pay | Admitting: Internal Medicine

## 2013-02-05 ENCOUNTER — Ambulatory Visit: Payer: BC Managed Care – PPO | Admitting: Internal Medicine

## 2013-02-12 ENCOUNTER — Other Ambulatory Visit (INDEPENDENT_AMBULATORY_CARE_PROVIDER_SITE_OTHER): Payer: 59

## 2013-02-12 ENCOUNTER — Ambulatory Visit (INDEPENDENT_AMBULATORY_CARE_PROVIDER_SITE_OTHER): Payer: 59 | Admitting: Internal Medicine

## 2013-02-12 ENCOUNTER — Encounter: Payer: Self-pay | Admitting: Internal Medicine

## 2013-02-12 VITALS — BP 130/90 | HR 95 | Temp 98.3°F | Ht 73.0 in | Wt 192.0 lb

## 2013-02-12 DIAGNOSIS — Z0000 Encounter for general adult medical examination without abnormal findings: Secondary | ICD-10-CM

## 2013-02-12 LAB — URINALYSIS, ROUTINE W REFLEX MICROSCOPIC
Bilirubin Urine: NEGATIVE
Ketones, ur: NEGATIVE
Leukocytes, UA: NEGATIVE
Nitrite: NEGATIVE
Specific Gravity, Urine: 1.025 (ref 1.000–1.030)
Total Protein, Urine: NEGATIVE
Urine Glucose: NEGATIVE
Urobilinogen, UA: 0.2 (ref 0.0–1.0)
pH: 5.5 (ref 5.0–8.0)

## 2013-02-12 LAB — LIPID PANEL
Cholesterol: 200 mg/dL (ref 0–200)
HDL: 72.5 mg/dL (ref >39.00)
LDL Cholesterol: 99 mg/dL (ref 0–99)
Total CHOL/HDL Ratio: 3
Triglycerides: 143 mg/dL (ref 0.0–149.0)
VLDL: 28.6 mg/dL (ref 0.0–40.0)

## 2013-02-12 LAB — CBC WITH DIFFERENTIAL/PLATELET
Basophils Absolute: 0 10*3/uL (ref 0.0–0.1)
Basophils Relative: 0.4 % (ref 0.0–3.0)
Eosinophils Absolute: 0 10*3/uL (ref 0.0–0.7)
Eosinophils Relative: 0.4 % (ref 0.0–5.0)
HCT: 46.6 % (ref 39.0–52.0)
Hemoglobin: 16.1 g/dL (ref 13.0–17.0)
Lymphocytes Relative: 26.2 % (ref 12.0–46.0)
Lymphs Abs: 2.1 10*3/uL (ref 0.7–4.0)
MCHC: 34.6 g/dL (ref 30.0–36.0)
MCV: 95 fl (ref 78.0–100.0)
Monocytes Absolute: 0.7 10*3/uL (ref 0.1–1.0)
Monocytes Relative: 8.2 % (ref 3.0–12.0)
Neutro Abs: 5.3 10*3/uL (ref 1.4–7.7)
Neutrophils Relative %: 64.8 % (ref 43.0–77.0)
Platelets: 243 10*3/uL (ref 150.0–400.0)
RBC: 4.9 Mil/uL (ref 4.22–5.81)
RDW: 12.9 % (ref 11.5–14.6)
WBC: 8.2 10*3/uL (ref 4.5–10.5)

## 2013-02-12 LAB — HEPATIC FUNCTION PANEL
ALT: 26 U/L (ref 0–53)
AST: 26 U/L (ref 0–37)
Albumin: 4.2 g/dL (ref 3.5–5.2)
Alkaline Phosphatase: 93 U/L (ref 39–117)
Bilirubin, Direct: 0.1 mg/dL (ref 0.0–0.3)
Total Bilirubin: 0.5 mg/dL (ref 0.3–1.2)
Total Protein: 7.1 g/dL (ref 6.0–8.3)

## 2013-02-12 LAB — BASIC METABOLIC PANEL
BUN: 16 mg/dL (ref 6–23)
CO2: 29 mEq/L (ref 19–32)
Calcium: 9.6 mg/dL (ref 8.4–10.5)
Chloride: 102 mEq/L (ref 96–112)
Creatinine, Ser: 0.8 mg/dL (ref 0.4–1.5)
GFR: 101.95 mL/min (ref >60.00)
Glucose, Bld: 105 mg/dL — ABNORMAL HIGH (ref 70–99)
Potassium: 4.5 mEq/L (ref 3.5–5.1)
Sodium: 140 mEq/L (ref 135–145)

## 2013-02-12 LAB — TSH: TSH: 1.87 u[IU]/mL (ref 0.35–5.50)

## 2013-02-12 LAB — PSA: PSA: 0.73 ng/mL (ref 0.10–4.00)

## 2013-02-12 MED ORDER — ALPRAZOLAM 1 MG PO TABS: 1.0000 mg | ORAL_TABLET | Freq: Every evening | ORAL | Status: DC | PRN

## 2013-02-12 MED ORDER — HYDROCODONE-ACETAMINOPHEN 5-325 MG PO TABS: 1.0000 | ORAL_TABLET | Freq: Every day | ORAL | Status: DC | PRN

## 2013-02-12 NOTE — Assessment & Plan Note (Signed)

## 2013-02-12 NOTE — Patient Instructions (Addendum)
Please continue all other medications as before, and refills have been done if requested. You are otherwise up to date with prevention measures today. Please go to the LAB in the Basement (turn left off the elevator) for the tests to be done today You will be contacted by phone if any changes need to be made immediately.  Otherwise, you will receive a letter about your results with an explanation, but please check with MyChart first. Thank you for enrolling in MyChart. Please follow the instructions below to securely access your online medical record. MyChart allows you to send messages to your doctor, view your test results, renew your prescriptions, schedule appointments, and more. /To Log into My Chart online, please go by [facility] or [employer] to [email or web address], or download the MyChart App from the [employer] .  Your Username is: [email or web address] (pass [phone]) Please send a practice Message on Mychart later today. Please return in 1 year for your yearly visit, or sooner if needed, with Lab testing done 3-5 days before

## 2013-02-12 NOTE — Progress Notes (Signed)
Subjective:    Patient ID: Randy Walker, male    DOB: [birth year], 57 y.o.   MRN: [id number]  HPI  Here for wellness and f/u;  Overall doing ok;  Pt denies CP, worsening SOB, DOE, wheezing, orthopnea, PND, worsening LE edema, palpitations, dizziness or syncope.  Pt denies neurological change such as new headache, facial or extremity weakness.  Pt denies polydipsia, polyuria, or low sugar symptoms. Pt states overall good compliance with treatment and medications, good tolerability, and has been trying to follow lower cholesterol diet.  Pt denies worsening depressive symptoms, suicidal ideation or panic. No fever, night sweats, wt loss, loss of appetite, or other constitutional symptoms.  Pt states good ability with ADL's, has low fall risk, home safety reviewed and adequate, no other significant changes in hearing or vision, and only occasionally active with exercise.  Pt continues to have recurring LBP without change in severity, bowel or bladder change, fever, wt loss,  worsening LE pain/numbness/weakness, gait change or falls. Denies worsening depressive symptoms, suicidal ideation, or panic. Xanax working well at night Past Medical History  Diagnosis Date  . ALLERGIC RHINITIS 09/10/2007  . ANXIETY 09/10/2007  . BACK PAIN 12/31/2010  . DISC DISEASE, LUMBAR 12/31/2010  . HYPERLIPIDEMIA 12/31/2010  . PSORIASIS 12/31/2010  . PULMONARY EMBOLISM, HX OF 09/10/2007  . RASH-NONVESICULAR 04/23/2009  . SMOKER 12/31/2010  . Adenomatous polyps 11/17/2011  . Alcohol use 11/17/2011  . 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 Spleen repair    . Rotator cuff repair  Jan, 2010    [facility], Left    reports that he has been smoking.  He does not have any smokeless tobacco history on file. He reports that  drinks alcohol. He reports that he does not use illicit drugs. family history is not on file. No Known Allergies Current Outpatient Prescriptions on File  Prior to Visit  Medication Sig Dispense Refill  . aspirin 81 MG tablet Take 81 mg by mouth daily.        . predniSONE (DELTASONE) 10 MG tablet Take 1 tablet (10 mg total) by mouth daily.  7 tablet  0  . triamcinolone cream (KENALOG) 0.1 % Apply topically 2 (two) times daily.  30 g  2   No current facility-administered medications on file prior to visit.   Review of Systems Constitutional: Negative for diaphoresis, activity change, appetite change or unexpected weight change.  HENT: Negative for hearing loss, ear pain, facial swelling, mouth sores and neck stiffness.   Eyes: Negative for pain, redness and visual disturbance.  Respiratory: Negative for shortness of breath and wheezing.   Cardiovascular: Negative for chest pain and palpitations.  Gastrointestinal: Negative for diarrhea, blood in stool, abdominal distention or other pain Genitourinary: Negative for hematuria, flank pain or change in urine volume.  Musculoskeletal: Negative for myalgias and joint swelling.  Skin: Negative for color change and wound.  Neurological: Negative for syncope and numbness. other than noted Hematological: Negative for adenopathy.  Psychiatric/Behavioral: Negative for hallucinations, self-injury, decreased concentration and agitation.      Objective:   Physical Exam BP 130/90  Pulse 95  Temp(Src) 98.3 F (36.8 C) (Oral)  Ht 6\' 1"  (1.854 m)  Wt 192 lb (87.091 kg)  BMI 25.34 kg/m2  SpO2 98% VS noted,  Constitutional: Pt is oriented to person, place, and time. Appears well-developed and well-nourished.  Head: Normocephalic and atraumatic.  Right Ear: External ear normal.  Left Ear:  External ear normal.  Nose: Nose normal.  Mouth/Throat: Oropharynx is clear and moist.  Eyes: Conjunctivae and EOM are normal. Pupils are equal, round, and reactive to light.  Neck: Normal range of motion. Neck supple. No JVD present. No tracheal deviation present.  Cardiovascular: Normal rate, regular rhythm,  normal heart sounds and intact distal pulses.   Pulmonary/Chest: Effort normal and breath sounds normal.  Abdominal: Soft. Bowel sounds are normal. There is no tenderness. No HSM  Musculoskeletal: Normal range of motion. Exhibits no edema.  Lymphadenopathy:  Has no cervical adenopathy.  Neurological: Pt is alert and oriented to person, place, and time. Pt has normal reflexes. No cranial nerve deficit.  Skin: Skin is warm and dry. No rash noted.  Psychiatric:  Has  normal mood and affect. Behavior is normal.     Assessment & Plan:

## 2013-02-13 ENCOUNTER — Encounter: Payer: Self-pay | Admitting: Internal Medicine

## 2013-08-23 ENCOUNTER — Other Ambulatory Visit: Payer: Self-pay | Admitting: Internal Medicine

## 2013-08-23 NOTE — Telephone Encounter (Signed)
Faxed hardcopy to [facility] [place] 

## 2013-08-23 NOTE — Telephone Encounter (Signed)
Done hardcopy to robin  

## 2013-09-20 ENCOUNTER — Encounter: Payer: Self-pay | Admitting: Internal Medicine

## 2013-09-27 ENCOUNTER — Other Ambulatory Visit: Payer: Self-pay

## 2014-02-19 ENCOUNTER — Other Ambulatory Visit: Payer: Self-pay

## 2014-02-19 MED ORDER — ALPRAZOLAM 1 MG PO TABS: ORAL_TABLET | ORAL | Status: DC

## 2014-02-19 NOTE — Telephone Encounter (Signed)
Done hardcopy to robin  

## 2014-02-19 NOTE — Telephone Encounter (Signed)
Faxed hardcopy to [facility].

## 2014-06-29 ENCOUNTER — Emergency Department: Payer: Self-pay | Admitting: Emergency Medicine

## 2014-09-03 ENCOUNTER — Encounter: Payer: Self-pay | Admitting: Internal Medicine

## 2014-09-26 ENCOUNTER — Other Ambulatory Visit (INDEPENDENT_AMBULATORY_CARE_PROVIDER_SITE_OTHER): Payer: No Typology Code available for payment source

## 2014-09-26 ENCOUNTER — Ambulatory Visit (INDEPENDENT_AMBULATORY_CARE_PROVIDER_SITE_OTHER): Payer: No Typology Code available for payment source | Admitting: Internal Medicine

## 2014-09-26 ENCOUNTER — Encounter: Payer: Self-pay | Admitting: Internal Medicine

## 2014-09-26 VITALS — BP 130/82 | HR 105 | Temp 98.5°F | Ht 74.0 in | Wt 194.0 lb

## 2014-09-26 DIAGNOSIS — F411 Generalized anxiety disorder: Secondary | ICD-10-CM

## 2014-09-26 DIAGNOSIS — M545 Low back pain, unspecified: Secondary | ICD-10-CM

## 2014-09-26 DIAGNOSIS — Z0000 Encounter for general adult medical examination without abnormal findings: Secondary | ICD-10-CM

## 2014-09-26 DIAGNOSIS — L409 Psoriasis, unspecified: Secondary | ICD-10-CM

## 2014-09-26 DIAGNOSIS — G8929 Other chronic pain: Secondary | ICD-10-CM | POA: Insufficient documentation

## 2014-09-26 LAB — HEPATIC FUNCTION PANEL
ALT: 26 U/L (ref 0–53)
AST: 26 U/L (ref 0–37)
Albumin: 3.6 g/dL (ref 3.5–5.2)
Alkaline Phosphatase: 74 U/L (ref 39–117)
Bilirubin, Direct: 0.1 mg/dL (ref 0.0–0.3)
Total Bilirubin: 0.5 mg/dL (ref 0.2–1.2)
Total Protein: 6.7 g/dL (ref 6.0–8.3)

## 2014-09-26 LAB — LIPID PANEL
Cholesterol: 231 mg/dL — ABNORMAL HIGH (ref 0–200)
HDL: 78.1 mg/dL (ref >39.00)
LDL Cholesterol: 139 mg/dL — ABNORMAL HIGH (ref 0–99)
NonHDL: 152.9
Total CHOL/HDL Ratio: 3
Triglycerides: 69 mg/dL (ref 0.0–149.0)
VLDL: 13.8 mg/dL (ref 0.0–40.0)

## 2014-09-26 LAB — URINALYSIS, ROUTINE W REFLEX MICROSCOPIC
Bilirubin Urine: NEGATIVE
Hgb urine dipstick: NEGATIVE
Ketones, ur: NEGATIVE
Leukocytes, UA: NEGATIVE
Nitrite: NEGATIVE
RBC / HPF: NONE SEEN (ref 0–?)
Specific Gravity, Urine: >=1.03 — AB (ref 1.000–1.030)
Total Protein, Urine: NEGATIVE
Urine Glucose: NEGATIVE
Urobilinogen, UA: 0.2 (ref 0.0–1.0)
pH: 5.5 (ref 5.0–8.0)

## 2014-09-26 LAB — CBC WITH DIFFERENTIAL/PLATELET
Basophils Absolute: 0.1 10*3/uL (ref 0.0–0.1)
Basophils Relative: 1 % (ref 0.0–3.0)
Eosinophils Absolute: 0.1 10*3/uL (ref 0.0–0.7)
Eosinophils Relative: 2.1 % (ref 0.0–5.0)
HCT: 45 % (ref 39.0–52.0)
Hemoglobin: 15.3 g/dL (ref 13.0–17.0)
Lymphocytes Relative: 31.2 % (ref 12.0–46.0)
Lymphs Abs: 2.1 10*3/uL (ref 0.7–4.0)
MCHC: 33.9 g/dL (ref 30.0–36.0)
MCV: 95.3 fl (ref 78.0–100.0)
Monocytes Absolute: 0.6 10*3/uL (ref 0.1–1.0)
Monocytes Relative: 8.8 % (ref 3.0–12.0)
Neutro Abs: 3.9 10*3/uL (ref 1.4–7.7)
Neutrophils Relative %: 56.9 % (ref 43.0–77.0)
Platelets: 231 10*3/uL (ref 150.0–400.0)
RBC: 4.72 Mil/uL (ref 4.22–5.81)
RDW: 13.3 % (ref 11.5–15.5)
WBC: 6.9 10*3/uL (ref 4.0–10.5)

## 2014-09-26 LAB — BASIC METABOLIC PANEL
BUN: 14 mg/dL (ref 6–23)
CO2: 25 mEq/L (ref 19–32)
Calcium: 9.6 mg/dL (ref 8.4–10.5)
Chloride: 107 mEq/L (ref 96–112)
Creatinine, Ser: 0.8 mg/dL (ref 0.4–1.5)
GFR: 102.79 mL/min (ref >60.00)
Glucose, Bld: 89 mg/dL (ref 70–99)
Potassium: 4.3 mEq/L (ref 3.5–5.1)
Sodium: 143 mEq/L (ref 135–145)

## 2014-09-26 LAB — TSH: TSH: 1.32 u[IU]/mL (ref 0.35–4.50)

## 2014-09-26 LAB — PSA: PSA: 0.79 ng/mL (ref 0.10–4.00)

## 2014-09-26 MED ORDER — METHYLPREDNISOLONE ACETATE 80 MG/ML IJ SUSP
120.0000 mg | Freq: Once | INTRAMUSCULAR | Status: AC
  Administered 2014-09-26: 120 mg via INTRAMUSCULAR

## 2014-09-26 MED ORDER — ALPRAZOLAM 1 MG PO TABS: ORAL_TABLET | ORAL | Status: DC

## 2014-09-26 MED ORDER — HYDROCODONE-ACETAMINOPHEN 5-325 MG PO TABS: ORAL_TABLET | ORAL | Status: DC

## 2014-09-26 NOTE — Progress Notes (Signed)
Subjective:    Patient ID: Randy Walker, male    DOB: 08/03/1956, 58 y.o.   MRN: [id number]  HPI  Here for wellness and f/u;  Overall doing ok;  Pt denies CP, worsening SOB, DOE, wheezing, orthopnea, PND, worsening LE edema, palpitations, dizziness or syncope.  Pt denies neurological change such as new headache, facial or extremity weakness.  Pt denies polydipsia, polyuria, or low sugar symptoms. Pt states overall good compliance with treatment and medications, good tolerability, and has been trying to follow lower cholesterol diet.  Pt denies worsening depressive symptoms, suicidal ideation or panic. No fever, night sweats, wt loss, loss of appetite, or other constitutional symptoms.  Pt states good ability with ADL's, has low fall risk, home safety reviewed and adequate, no other significant changes in hearing or vision, and only occasionally active with exercise.  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 Declines flu shot.  Pt continues to have recurring LBP without change in severity, bowel or bladder change, fever, wt loss,  worsening LE pain/numbness/weakness, gait change or falls. Now back on his feet working now instead of sitting, did well in past with once per day hydrocodone in the AM.  Psoriasis is very active recently now involving arms, legs, neck and face. Denies worsening depressive symptoms, suicidal ideation, or panic; has ongoing anxiety. Past Medical History  Diagnosis Date  . ALLERGIC RHINITIS 09/10/2007  . ANXIETY 09/10/2007  . BACK PAIN 12/31/2010  . [place] DISEASE, LUMBAR 12/31/2010  . HYPERLIPIDEMIA 12/31/2010  . PSORIASIS 12/31/2010  . PULMONARY EMBOLISM, HX OF 09/10/2007  . RASH-NONVESICULAR 04/23/2009  . SMOKER 12/31/2010  . Adenomatous polyps 11/17/2011  . Alcohol use 11/17/2011  . 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 Spleen repair    . Rotator cuff repair  Jan, 2010    [facility], Left    reports that he has been smoking.  He does not have any smokeless tobacco history on file. He reports that he drinks alcohol. He reports that he does not use illicit drugs. family history is not on file. No Known Allergies Current Outpatient Prescriptions on File Prior to Visit  Medication Sig Dispense Refill  . aspirin 81 MG tablet Take 81 mg by mouth daily.      . predniSONE (DELTASONE) 10 MG tablet Take 1 tablet (10 mg total) by mouth daily. 7 tablet 0  . triamcinolone cream (KENALOG) 0.1 % Apply topically 2 (two) times daily. 30 g 2   No current facility-administered medications on file prior to visit.      Review of Systems Constitutional: Negative for increased diaphoresis, other activity, appetite or other siginficant weight change  HENT: Negative for worsening hearing loss, ear pain, facial swelling, mouth sores and neck stiffness.   Eyes: Negative for other worsening pain, redness or visual disturbance.  Respiratory: Negative for shortness of breath and wheezing.   Cardiovascular: Negative for chest pain and palpitations.  Gastrointestinal: Negative for diarrhea, blood in stool, abdominal distention or other pain Genitourinary: Negative for hematuria, flank pain or change in urine volume.  Musculoskeletal: Negative for myalgias or other joint complaints.  Skin: Negative for color change and wound.  Neurological: Negative for syncope and numbness. other than noted Hematological: Negative for adenopathy. or other swelling Psychiatric/Behavioral: Negative for hallucinations, self-injury, decreased concentration or other worsening agitation.      Objective:   Physical Exam BP 130/82 mmHg  Pulse  105  Temp(Src) 98.5 F (36.9 C) (Oral)  Ht 6\' 2"  (1.88 m)  Wt 194 lb (87.998 kg)  BMI 24.90 kg/m2  SpO2 96% VS noted,  Constitutional: Pt is oriented to person, place, and time. Appears well-developed and well-nourished.  Head: Normocephalic and atraumatic.  Right Ear: External ear  normal.  Left Ear: External ear normal.  Nose: Nose normal.  Mouth/Throat: Oropharynx is clear and moist.  Eyes: Conjunctivae and EOM are normal. Pupils are equal, round, and reactive to light.  Neck: Normal range of motion. Neck supple. No JVD present. No tracheal deviation present.  Cardiovascular: Normal rate, regular rhythm, normal heart sounds and intact distal pulses.   Pulmonary/Chest: Effort normal and breath sounds without rales or wheezing  Abdominal: Soft. Bowel sounds are normal. NT. No HSM  Musculoskeletal: Normal range of motion. Exhibits no edema.  Lymphadenopathy:  Has no cervical adenopathy.  Neurological: Pt is alert and oriented to person, place, and time. Pt has normal reflexes. No cranial nerve deficit. Motor grossly intact Skin: Skin is warm and dry.severe psoriatic plaques to elbows, legs, ant neck and face Psychiatric:  Has nervous mood and affect. Behavior is normal.     Assessment & Plan:

## 2014-09-26 NOTE — Assessment & Plan Note (Signed)
[facility] for refill pain med,  to f/u any worsening symptoms or concerns

## 2014-09-26 NOTE — Patient Instructions (Addendum)
You had the steroid shot today  Please continue all other medications as before, and refills have been done if requested.  Please have the pharmacy call with any other refills you may need.  Please continue your efforts at being more active, low cholesterol diet, and weight control.  You are otherwise up to date with prevention measures today.  Please keep your appointments with your specialists as you may have planned  You will be contacted regarding the referral for: colonoscopy, and dermatology  Please go to the LAB in the Basement (turn left off the elevator) for the tests to be done today  You will be contacted by phone if any changes need to be made immediately.  Otherwise, you will receive a letter about your results with an explanation, but please check with MyChart first.  /To Log into My Chart online, please go by [employer] or [employer] to [email or web address], or download the MyChart App from the [employer]. Your Username is: [email or web address] (pass [phone])  Please return in 1 year for your yearly visit, or sooner if needed, with Lab testing done 3-5 days before

## 2014-09-26 NOTE — Assessment & Plan Note (Signed)
Quite active recently, for depomedrol IM , refer dermatology

## 2014-09-26 NOTE — Assessment & Plan Note (Signed)
stable overall by history and exam, recent data reviewed with pt, and pt to continue medical treatment as before,  to f/u any worsening symptoms or concerns Lab Results  Component Value Date   WBC 8.2 02/12/2013   HGB 16.1 02/12/2013   HCT 46.6 02/12/2013   PLT 243.0 02/12/2013   GLUCOSE 105* 02/12/2013   CHOL 200 02/12/2013   TRIG 143.0 02/12/2013   HDL 72.50 02/12/2013   LDLDIRECT 130.5 04/23/2009   LDLCALC 99 02/12/2013   ALT 26 02/12/2013   AST 26 02/12/2013   NA 140 02/12/2013   K 4.5 02/12/2013   CL 102 02/12/2013   CREATININE 0.8 02/12/2013   BUN 16 02/12/2013   CO2 29 02/12/2013   TSH 1.87 02/12/2013   PSA 0.73 02/12/2013

## 2014-09-26 NOTE — Progress Notes (Signed)
Pre visit review using our clinic review tool, if applicable. No additional management support is needed unless otherwise documented below in the visit note. 

## 2014-09-27 ENCOUNTER — Telehealth: Payer: Self-pay | Admitting: Internal Medicine

## 2014-09-27 NOTE — Telephone Encounter (Signed)
emmi emailed °

## 2014-10-01 ENCOUNTER — Encounter: Payer: Self-pay | Admitting: Internal Medicine

## 2014-12-09 ENCOUNTER — Encounter: Payer: Self-pay | Admitting: Internal Medicine

## 2014-12-30 ENCOUNTER — Other Ambulatory Visit: Payer: Self-pay | Admitting: Internal Medicine

## 2014-12-30 ENCOUNTER — Encounter: Payer: Self-pay | Admitting: Internal Medicine

## 2014-12-31 ENCOUNTER — Other Ambulatory Visit: Payer: Self-pay

## 2014-12-31 MED ORDER — HYDROCODONE-ACETAMINOPHEN 5-325 MG PO TABS: 1.0000 | ORAL_TABLET | Freq: Every day | ORAL | Status: DC | PRN

## 2015-01-02 ENCOUNTER — Other Ambulatory Visit: Payer: Self-pay | Admitting: Internal Medicine

## 2015-01-02 MED ORDER — HYDROCODONE-ACETAMINOPHEN 5-325 MG PO TABS: 1.0000 | ORAL_TABLET | Freq: Every day | ORAL | Status: DC | PRN

## 2015-01-02 NOTE — Telephone Encounter (Signed)
Done hardcopy as first could not be located, pt here for pickup

## 2015-03-03 ENCOUNTER — Encounter: Payer: No Typology Code available for payment source | Admitting: Internal Medicine

## 2015-04-03 ENCOUNTER — Telehealth: Payer: Self-pay | Admitting: Internal Medicine

## 2015-04-03 ENCOUNTER — Other Ambulatory Visit: Payer: Self-pay | Admitting: Internal Medicine

## 2015-04-03 MED ORDER — HYDROCODONE-ACETAMINOPHEN 5-325 MG PO TABS
1.0000 | ORAL_TABLET | Freq: Every day | ORAL | Status: DC | PRN
Start: 2015-04-03 — End: 2015-07-02

## 2015-04-03 NOTE — Telephone Encounter (Signed)
Done hardcopy to steph 

## 2015-04-03 NOTE — Addendum Note (Signed)
Addended by: Biagio Borg on: 04/03/2015 01:21 PM   Modules accepted: Orders

## 2015-05-21 ENCOUNTER — Other Ambulatory Visit: Payer: Self-pay | Admitting: Internal Medicine

## 2015-05-21 NOTE — Telephone Encounter (Signed)
Done hardcopy to dahlia 

## 2015-05-21 NOTE — Telephone Encounter (Signed)
Rx faxed to pharmacy  

## 2015-07-02 ENCOUNTER — Encounter: Payer: Self-pay | Admitting: Internal Medicine

## 2015-07-02 ENCOUNTER — Telehealth: Payer: Self-pay | Admitting: Internal Medicine

## 2015-07-02 MED ORDER — HYDROCODONE-ACETAMINOPHEN 5-325 MG PO TABS: 1.0000 | ORAL_TABLET | Freq: Every day | ORAL | Status: DC | PRN

## 2015-07-02 NOTE — Telephone Encounter (Signed)
Done hardcopy to Dahlia  

## 2015-09-30 ENCOUNTER — Encounter: Payer: Self-pay | Admitting: Internal Medicine

## 2015-09-30 ENCOUNTER — Ambulatory Visit (INDEPENDENT_AMBULATORY_CARE_PROVIDER_SITE_OTHER): Payer: No Typology Code available for payment source | Admitting: Internal Medicine

## 2015-09-30 VITALS — BP 136/86 | HR 74 | Temp 98.0°F | Ht 74.0 in | Wt 194.0 lb

## 2015-09-30 DIAGNOSIS — L4050 Arthropathic psoriasis, unspecified: Secondary | ICD-10-CM

## 2015-09-30 DIAGNOSIS — Z0000 Encounter for general adult medical examination without abnormal findings: Secondary | ICD-10-CM | POA: Diagnosis not present

## 2015-09-30 MED ORDER — HYDROCODONE-ACETAMINOPHEN 5-325 MG PO TABS: 1.0000 | ORAL_TABLET | Freq: Every day | ORAL | Status: DC | PRN

## 2015-09-30 MED ORDER — ALPRAZOLAM 1 MG PO TABS
ORAL_TABLET | ORAL | Status: DC
Start: 2015-09-30 — End: 2016-05-19

## 2015-09-30 NOTE — Progress Notes (Signed)
Subjective:    Patient ID: Randy Walker, male    DOB: [birth year], 60 y.o.   MRN: [id number]  HPI  Here for wellness and f/u;  Overall doing ok;  Pt denies Chest pain, worsening SOB, DOE, wheezing, orthopnea, PND, worsening LE edema, palpitations, dizziness or syncope.  Pt denies neurological change such as new headache, facial or extremity weakness.  Pt denies polydipsia, polyuria, or low sugar symptoms. Pt states overall good compliance with treatment and medications, good tolerability, and has been trying to follow appropriate diet.  Pt denies worsening depressive symptoms, suicidal ideation or panic. No fever, night sweats, wt loss, loss of appetite, or other constitutional symptoms.  Pt states good ability with ADL's, has low fall risk, home safety reviewed and adequate, no other significant changes in hearing or vision, and only occasionally active with exercise.  Does have some left hip pain more recently, has been out of humira x 6 wks, but plans to get back with Derm to restart, has worked well overall for his psoriasis and psoriatic arthritis Past Medical History  Diagnosis Date  . ALLERGIC RHINITIS 09/10/2007  . ANXIETY 09/10/2007  . BACK PAIN 12/31/2010  . [place] DISEASE, LUMBAR 12/31/2010  . HYPERLIPIDEMIA 12/31/2010  . PSORIASIS 12/31/2010  . PULMONARY EMBOLISM, HX OF 09/10/2007  . RASH-NONVESICULAR 04/23/2009  . SMOKER 12/31/2010  . Adenomatous polyps 11/17/2011  . Alcohol use ([place]) 11/17/2011  . 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 Spleen repair    . Rotator cuff repair  Jan, 2010    [facility], Left    reports that he has been smoking.  He does not have any smokeless tobacco history on file. He reports that he drinks alcohol. He reports that he does not use illicit drugs. family history is not on file. No Known Allergies Current Outpatient Prescriptions on File Prior to Visit  Medication Sig Dispense Refill  .  ALPRAZolam (XANAX) 1 MG tablet TALE 1 TABLET BY MOUTH AT BEDTIME AS NEEDED 30 tablet 5  . HYDROcodone-acetaminophen (NORCO/VICODIN) 5-325 MG per tablet Take 1 tablet by mouth daily as needed for moderate pain. 90 tablet 0  . aspirin 81 MG tablet Take 81 mg by mouth daily.      Marland Kitchen HYDROcodone-acetaminophen (NORCO/VICODIN) 5-325 MG per tablet Take 1 tablet by mouth daily as needed for moderate pain. (Patient not taking: Reported on 09/30/2015) 90 tablet 0   No current facility-administered medications on file prior to visit.   Review of Systems Constitutional: Negative for increased diaphoresis, other activity, appetite or siginficant weight change other than noted HENT: Negative for worsening hearing loss, ear pain, facial swelling, mouth sores and neck stiffness.   Eyes: Negative for other worsening pain, redness or visual disturbance.  Respiratory: Negative for shortness of breath and wheezing  Cardiovascular: Negative for chest pain and palpitations.  Gastrointestinal: Negative for diarrhea, blood in stool, abdominal distention or other pain Genitourinary: Negative for hematuria, flank pain or change in urine volume.  Musculoskeletal: Negative for myalgias or other joint complaints.  Skin: Negative for color change and wound or drainage.  Neurological: Negative for syncope and numbness. other than noted Hematological: Negative for adenopathy. or other swelling Psychiatric/Behavioral: Negative for hallucinations, SI, self-injury, decreased concentration or other worsening agitation.      Objective:   Physical Exam BP 136/86 mmHg  Pulse 74  Temp(Src) 98 F (36.7 C) (Oral)  Ht 6\' 2"  (1.88 m)  Wt  194 lb (87.998 kg)  BMI 24.90 kg/m2  SpO2 97% VS noted,  Constitutional: Pt is oriented to person, place, and time. Appears well-developed and well-nourished, in no significant distress Head: Normocephalic and atraumatic.  Right Ear: External ear normal.  Left Ear: External ear normal.    Nose: Nose normal.  Mouth/Throat: Oropharynx is clear and moist.  Eyes: Conjunctivae and EOM are normal. Pupils are equal, round, and reactive to light.  Neck: Normal range of motion. Neck supple. No JVD present. No tracheal deviation present or significant neck LA or mass Cardiovascular: Normal rate, regular rhythm, normal heart sounds and intact distal pulses.   Pulmonary/Chest: Effort normal and breath sounds without rales or wheezing  Abdominal: Soft. Bowel sounds are normal. NT. No HSM  Musculoskeletal: Normal range of motion. Exhibits no edema.  Lymphadenopathy:  Has no cervical adenopathy.  Neurological: Pt is alert and oriented to person, place, and time. Pt has normal reflexes. No cranial nerve deficit. Motor grossly intact Skin: Skin is warm and dry. No rash noted. except slight psoiratic plaques to elbows Psychiatric:  Has normal mood and affect. Behavior is normal.      Assessment & Plan:

## 2015-09-30 NOTE — Patient Instructions (Signed)
Please continue all other medications as before, and refills have been done if requested.  Please have the pharmacy call with any other refills you may need.  Please continue your efforts at being more active, low cholesterol diet, and weight control.  You are otherwise up to date with prevention measures today.  Please keep your appointments with your specialists as you may have planned  You will be contacted regarding the referral for: colonoscopy  Please go to the LAB in the Basement (turn left off the elevator) for the tests to be done today  You will be contacted by phone if any changes need to be made immediately.  Otherwise, you will receive a letter about your results with an explanation, but please check with MyChart first.  Please return in 1 year for your yearly visit, or sooner if needed, with Lab testing done 3-5 days before

## 2015-09-30 NOTE — Assessment & Plan Note (Signed)

## 2015-09-30 NOTE — Progress Notes (Signed)
Pre visit review using our clinic review tool, if applicable. No additional management support is needed unless otherwise documented below in the visit note. 

## 2015-12-18 ENCOUNTER — Telehealth: Payer: Self-pay | Admitting: Internal Medicine

## 2015-12-18 MED ORDER — HYDROCODONE-ACETAMINOPHEN 5-325 MG PO TABS: 1.0000 | ORAL_TABLET | Freq: Every day | ORAL | Status: DC | PRN

## 2015-12-18 NOTE — Telephone Encounter (Signed)
Medication printed signed and faxed

## 2015-12-18 NOTE — Addendum Note (Signed)
Addended by: Biagio Borg on: 12/18/2015 01:07 PM   Modules accepted: Orders

## 2015-12-18 NOTE — Telephone Encounter (Signed)
Done hardcopy to Corinne  

## 2016-03-20 ENCOUNTER — Other Ambulatory Visit: Payer: Self-pay | Admitting: Internal Medicine

## 2016-03-22 ENCOUNTER — Telehealth: Payer: Self-pay | Admitting: Internal Medicine

## 2016-03-23 MED ORDER — HYDROCODONE-ACETAMINOPHEN 5-325 MG PO TABS: 1.0000 | ORAL_TABLET | Freq: Every day | ORAL | Status: DC | PRN

## 2016-03-23 NOTE — Telephone Encounter (Signed)
Patient called, medication placed up front for pick up

## 2016-03-23 NOTE — Addendum Note (Signed)
Addended by: Biagio Borg on: 03/23/2016 01:18 PM   Modules accepted: Orders

## 2016-03-23 NOTE — Telephone Encounter (Signed)
Done hardcopy to Corinne  

## 2016-05-19 ENCOUNTER — Telehealth: Payer: Self-pay

## 2016-05-19 MED ORDER — ALPRAZOLAM 1 MG PO TABS
ORAL_TABLET | ORAL | Status: DC
Start: 2016-05-19 — End: 2016-11-25

## 2016-05-19 NOTE — Telephone Encounter (Signed)
Please advise patient is requesting refill of xanax  

## 2016-05-19 NOTE — Telephone Encounter (Signed)
Done hardcopy to Corinne  

## 2016-05-19 NOTE — Addendum Note (Signed)
Addended by: Biagio Borg on: 05/19/2016 06:43 PM   Modules accepted: Orders

## 2016-05-20 NOTE — Telephone Encounter (Signed)
Medication refill sent to pharmacy  

## 2016-06-18 ENCOUNTER — Telehealth: Payer: Self-pay | Admitting: Internal Medicine

## 2016-06-18 ENCOUNTER — Other Ambulatory Visit: Payer: Self-pay | Admitting: Internal Medicine

## 2016-06-18 MED ORDER — HYDROCODONE-ACETAMINOPHEN 5-325 MG PO TABS: 1.0000 | ORAL_TABLET | Freq: Every day | ORAL | 0 refills | Status: DC | PRN

## 2016-06-18 NOTE — Telephone Encounter (Signed)
Patient advised medication ready for pick up

## 2016-06-18 NOTE — Telephone Encounter (Signed)
30 day supply in PCP absence. [place] Controlled Substance Database reviewed with no irregularities.

## 2016-06-21 NOTE — Telephone Encounter (Signed)
vicodin refill too soon  Just done per Terri Piedra July 28

## 2016-07-19 ENCOUNTER — Other Ambulatory Visit: Payer: Self-pay | Admitting: Family

## 2016-07-20 ENCOUNTER — Encounter: Payer: Self-pay | Admitting: Internal Medicine

## 2016-07-20 ENCOUNTER — Other Ambulatory Visit: Payer: No Typology Code available for payment source

## 2016-07-20 MED ORDER — HYDROCODONE-ACETAMINOPHEN 5-325 MG PO TABS: 1.0000 | ORAL_TABLET | Freq: Every day | ORAL | 0 refills | Status: DC | PRN

## 2016-07-20 MED ORDER — HYDROCODONE-ACETAMINOPHEN 5-325 MG PO TABS
1.0000 | ORAL_TABLET | Freq: Every day | ORAL | 0 refills | Status: DC | PRN
Start: 2016-07-20 — End: 2016-10-18

## 2016-07-20 NOTE — Telephone Encounter (Signed)
Total 3 mo rx for vicodin  - Done hardcopy to [employer]

## 2016-07-20 NOTE — Telephone Encounter (Signed)
Done hardcopy to Corinne  

## 2016-07-20 NOTE — Telephone Encounter (Signed)
Called pt no answer LMOM rx's ready for pick-up.../lmb 

## 2016-10-18 ENCOUNTER — Encounter: Payer: Self-pay | Admitting: Internal Medicine

## 2016-10-18 ENCOUNTER — Other Ambulatory Visit: Payer: Self-pay | Admitting: Internal Medicine

## 2016-10-18 DIAGNOSIS — Z0001 Encounter for general adult medical examination with abnormal findings: Secondary | ICD-10-CM

## 2016-10-19 MED ORDER — HYDROCODONE-ACETAMINOPHEN 5-325 MG PO TABS: 1.0000 | ORAL_TABLET | Freq: Every day | ORAL | 0 refills | Status: DC | PRN

## 2016-10-19 NOTE — Telephone Encounter (Signed)
Pain med is done already to Randy Walker  Office to contact pt to make yearly OV appt please

## 2016-10-19 NOTE — Telephone Encounter (Signed)
Done hardcopy to Corinne  Please ask pt to make ROV for further refills

## 2016-10-20 NOTE — Telephone Encounter (Signed)
Tried calling pt can't leave msg due to vm not set-up. Set mychart msg informing pt rx's are ready for pick-up...Randy Walker

## 2016-11-18 ENCOUNTER — Other Ambulatory Visit (INDEPENDENT_AMBULATORY_CARE_PROVIDER_SITE_OTHER): Payer: 59

## 2016-11-18 DIAGNOSIS — Z0001 Encounter for general adult medical examination with abnormal findings: Secondary | ICD-10-CM

## 2016-11-18 DIAGNOSIS — R7989 Other specified abnormal findings of blood chemistry: Secondary | ICD-10-CM

## 2016-11-18 LAB — HEPATIC FUNCTION PANEL
ALT: 38 U/L (ref 0–53)
AST: 31 U/L (ref 0–37)
Albumin: 4 g/dL (ref 3.5–5.2)
Alkaline Phosphatase: 91 U/L (ref 39–117)
Bilirubin, Direct: 0.1 mg/dL (ref 0.0–0.3)
Total Bilirubin: 0.4 mg/dL (ref 0.2–1.2)
Total Protein: 6.6 g/dL (ref 6.0–8.3)

## 2016-11-18 LAB — BASIC METABOLIC PANEL
BUN: 14 mg/dL (ref 6–23)
CO2: 31 mEq/L (ref 19–32)
Calcium: 9.2 mg/dL (ref 8.4–10.5)
Chloride: 103 mEq/L (ref 96–112)
Creatinine, Ser: 0.91 mg/dL (ref 0.40–1.50)
GFR: 90.47 mL/min (ref >60.00)
Glucose, Bld: 146 mg/dL — ABNORMAL HIGH (ref 70–99)
Potassium: 4.2 mEq/L (ref 3.5–5.1)
Sodium: 140 mEq/L (ref 135–145)

## 2016-11-18 LAB — LIPID PANEL
Cholesterol: 212 mg/dL — ABNORMAL HIGH (ref 0–200)
HDL: 64.3 mg/dL (ref >39.00)
NonHDL: 147.64
Total CHOL/HDL Ratio: 3
Triglycerides: 399 mg/dL — ABNORMAL HIGH (ref 0.0–149.0)
VLDL: 79.8 mg/dL — ABNORMAL HIGH (ref 0.0–40.0)

## 2016-11-18 LAB — CBC WITH DIFFERENTIAL/PLATELET
Basophils Absolute: 0 10*3/uL (ref 0.0–0.1)
Basophils Relative: 0.4 % (ref 0.0–3.0)
Eosinophils Absolute: 0.1 10*3/uL (ref 0.0–0.7)
Eosinophils Relative: 1.7 % (ref 0.0–5.0)
HCT: 44.8 % (ref 39.0–52.0)
Hemoglobin: 15.7 g/dL (ref 13.0–17.0)
Lymphocytes Relative: 34.7 % (ref 12.0–46.0)
Lymphs Abs: 2.2 10*3/uL (ref 0.7–4.0)
MCHC: 35.1 g/dL (ref 30.0–36.0)
MCV: 95.3 fl (ref 78.0–100.0)
Monocytes Absolute: 0.5 10*3/uL (ref 0.1–1.0)
Monocytes Relative: 8.3 % (ref 3.0–12.0)
Neutro Abs: 3.4 10*3/uL (ref 1.4–7.7)
Neutrophils Relative %: 54.9 % (ref 43.0–77.0)
Platelets: 212 10*3/uL (ref 150.0–400.0)
RBC: 4.7 Mil/uL (ref 4.22–5.81)
RDW: 13 % (ref 11.5–15.5)
WBC: 6.3 10*3/uL (ref 4.0–10.5)

## 2016-11-18 LAB — URINALYSIS, ROUTINE W REFLEX MICROSCOPIC
Bilirubin Urine: NEGATIVE
Hgb urine dipstick: NEGATIVE
Ketones, ur: NEGATIVE
Leukocytes, UA: NEGATIVE
Nitrite: NEGATIVE
Specific Gravity, Urine: 1.02 (ref 1.000–1.030)
Total Protein, Urine: NEGATIVE
Urine Glucose: NEGATIVE
Urobilinogen, UA: 0.2 (ref 0.0–1.0)
pH: 6 (ref 5.0–8.0)

## 2016-11-18 LAB — PSA: PSA: 0.46 ng/mL (ref 0.10–4.00)

## 2016-11-18 LAB — TSH: TSH: 2.45 u[IU]/mL (ref 0.35–4.50)

## 2016-11-18 LAB — LDL CHOLESTEROL, DIRECT: Direct LDL: 90 mg/dL

## 2016-11-23 ENCOUNTER — Encounter: Payer: Self-pay | Admitting: Internal Medicine

## 2016-11-23 ENCOUNTER — Ambulatory Visit (INDEPENDENT_AMBULATORY_CARE_PROVIDER_SITE_OTHER): Payer: 59 | Admitting: Internal Medicine

## 2016-11-23 ENCOUNTER — Ambulatory Visit (INDEPENDENT_AMBULATORY_CARE_PROVIDER_SITE_OTHER)
Admission: RE | Admit: 2016-11-23 | Discharge: 2016-11-23 | Disposition: A | Discharge status: Home / Self Care | Payer: 59 | Source: Ambulatory Visit | Attending: Internal Medicine | Admitting: Internal Medicine

## 2016-11-23 VITALS — BP 140/80 | HR 69 | Temp 98.8°F | Resp 20 | Wt 193.0 lb

## 2016-11-23 DIAGNOSIS — M1612 Unilateral primary osteoarthritis, left hip: Secondary | ICD-10-CM | POA: Diagnosis not present

## 2016-11-23 DIAGNOSIS — Z86010 Personal history of colonic polyps: Secondary | ICD-10-CM | POA: Diagnosis not present

## 2016-11-23 DIAGNOSIS — R079 Chest pain, unspecified: Secondary | ICD-10-CM | POA: Diagnosis not present

## 2016-11-23 DIAGNOSIS — K219 Gastro-esophageal reflux disease without esophagitis: Secondary | ICD-10-CM | POA: Diagnosis not present

## 2016-11-23 DIAGNOSIS — Z1159 Encounter for screening for other viral diseases: Secondary | ICD-10-CM | POA: Diagnosis not present

## 2016-11-23 DIAGNOSIS — R1032 Left lower quadrant pain: Secondary | ICD-10-CM | POA: Diagnosis not present

## 2016-11-23 DIAGNOSIS — Z0001 Encounter for general adult medical examination with abnormal findings: Secondary | ICD-10-CM | POA: Diagnosis not present

## 2016-11-23 DIAGNOSIS — R739 Hyperglycemia, unspecified: Secondary | ICD-10-CM | POA: Diagnosis not present

## 2016-11-23 DIAGNOSIS — Z7289 Other problems related to lifestyle: Secondary | ICD-10-CM

## 2016-11-23 DIAGNOSIS — Z789 Other specified health status: Secondary | ICD-10-CM

## 2016-11-23 HISTORY — DX: Hyperglycemia, unspecified: R73.9

## 2016-11-23 HISTORY — DX: Gastro-esophageal reflux disease without esophagitis: K21.9

## 2016-11-23 MED ORDER — HUMIRA PEN 40 MG/0.8ML ~~LOC~~ PNKT: 40.0000 mg | PEN_INJECTOR | SUBCUTANEOUS | 0 refills | Status: DC

## 2016-11-23 MED ORDER — PANTOPRAZOLE SODIUM 40 MG PO TBEC: 40.0000 mg | DELAYED_RELEASE_TABLET | Freq: Every day | ORAL | 3 refills | Status: DC

## 2016-11-23 MED ORDER — HYDROCODONE-ACETAMINOPHEN 5-325 MG PO TABS: 1.0000 | ORAL_TABLET | Freq: Every day | ORAL | 0 refills | Status: DC | PRN

## 2016-11-23 NOTE — Progress Notes (Signed)
Subjective:    Patient ID: Randy Walker, male    DOB: [birth year], 61 y.o.   MRN: [id number]  HPI  Here for wellness and f/u;  Overall doing ok;  Pt denies wheezing, orthopnea, PND, worsening LE edema, palpitations, dizziness or syncope.  Pt denies neurological change such as new headache, facial or extremity weakness.  Pt denies polydipsia, polyuria, or low sugar symptoms. Pt states overall good compliance with treatment and medications, good tolerability, and has been trying to follow appropriate diet.  Pt denies worsening depressive symptoms, suicidal ideation or panic. No fever, night sweats, wt loss, loss of appetite, or other constitutional symptoms.  Pt states good ability with ADL's, has low fall risk, home safety reviewed and adequate, no other significant changes in hearing or vision, and only occasionally active with exercise.  Due for colonoscopy, willing to consider now that has new insurance. Has been working 16 hr days recently for the holidays.  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  Does c/o 5 days CP with ? Radiation to  left arm pain, + pleuritic, nonexertional, nonpositional but has sob, fatigue, but no n/v, palps, diaphoresis, or dizziness.  Did try an antacid otc prilosec and seemed to help   Also c/o mod to severe worsening 2 mo left groin pain , sharp, intermittent, worse to walk and stand > 30 min to 1 hr, better to sit.   Past Medical History:  Diagnosis Date  . Adenomatous polyps 11/17/2011  . Alcohol use 11/17/2011  . ALLERGIC RHINITIS 09/10/2007  . ANXIETY 09/10/2007  . BACK PAIN 12/31/2010  . [place] DISEASE, LUMBAR 12/31/2010  . GERD (gastroesophageal reflux disease) 11/23/2016  . Hyperglycemia 11/23/2016  . HYPERLIPIDEMIA 12/31/2010  . Personal history of rectal adenomas 01/29/2008  . PSORIASIS 12/31/2010  . PULMONARY EMBOLISM, HX OF 09/10/2007  . RASH-NONVESICULAR 04/23/2009  . SMOKER 12/31/2010   Past Surgical History:  Procedure  Laterality Date  . ROTATOR CUFF REPAIR  Jan, 2010   [facility], Left  . spleen repair      reports that he has been smoking.  He does not have any smokeless tobacco history on file. He reports that he drinks alcohol. He reports that he does not use drugs. family history is not on file. No Known Allergies Current Outpatient Prescriptions on File Prior to Visit  Medication Sig Dispense Refill  . aspirin 81 MG tablet Take 81 mg by mouth daily.       No current facility-administered medications on file prior to visit.    Review of Systems Constitutional: Negative for increased diaphoresis, or other activity, appetite or siginficant weight change other than noted HENT: Negative for worsening hearing loss, ear pain, facial swelling, mouth sores and neck stiffness.   Eyes: Negative for other worsening pain, redness or visual disturbance.  Respiratory: Negative for choking or stridor Cardiovascular: Negative for other chest pain and palpitations.  Gastrointestinal: Negative for worsening diarrhea, blood in stool, or abdominal distention Genitourinary: Negative for hematuria, flank pain or change in urine volume.  Musculoskeletal: Negative for myalgias or other joint complaints.  Skin: Negative for other color change and wound or drainage.  Neurological: Negative for syncope and numbness. other than noted Hematological: Negative for adenopathy. or other swelling Psychiatric/Behavioral: Negative for hallucinations, SI, self-injury, decreased concentration or other worsening agitation.  All other system neg per pt    Objective:   Physical Exam BP 140/80   Pulse 69  Temp 98.8 F (37.1 C) (Oral)   Resp 20   Wt 193 lb (87.5 kg)   SpO2 98%   BMI 24.78 kg/m  VS noted,  Constitutional: Pt is oriented to person, place, and time. Appears well-developed and well-nourished, in no significant distress Head: Normocephalic and atraumatic  Eyes: Conjunctivae and EOM are normal. Pupils are equal,  round, and reactive to light Right Ear: External ear normal.  Left Ear: External ear normal Nose: Nose normal.  Mouth/Throat: Oropharynx is clear and moist  Neck: Normal range of motion. Neck supple. No JVD present. No tracheal deviation present or significant neck LA or mass Cardiovascular: Normal rate, regular rhythm, normal heart sounds and intact distal pulses.   Pulmonary/Chest: Effort normal and breath sounds without rales or wheezing  Abdominal: Soft. Bowel sounds are normal. NT. No HSM  Musculoskeletal: Normal range of motion except for left hip decreased ROM to flexion and rotation.. Exhibits no edema Lymphadenopathy: Has no cervical adenopathy.  Neurological: Pt is alert and oriented to person, place, and time. Pt has normal reflexes. No cranial nerve deficit. Motor grossly intact Skin: Skin is warm and dry. No rash noted or new ulcers Psychiatric:  Has normal mood and affect. Behavior is normal.  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   -Old anteroseptal infarct.   POCT A1c - 5.0    Assessment & Plan:

## 2016-11-23 NOTE — Progress Notes (Signed)
Pre visit review using our clinic review tool, if applicable. No additional management support is needed unless otherwise documented below in the visit note. 

## 2016-11-23 NOTE — Patient Instructions (Addendum)
Your A1c (sugar test) was:  5.0  (normal)  Please take all new medication as prescribed - the protonix for acid  Please continue all other medications as before, and refills have been done if requested.  Please have the pharmacy call with any other refills you may need.  Please continue your efforts at being more active, low cholesterol diet, and weight control.  You are otherwise up to date with prevention measures today.  Please keep your appointments with your specialists as you may have planned  Please go to the XRAY Department in the Basement (go straight as you get off the elevator) for the x-ray testing - the left hip film  You will be contacted regarding the referral for: Dr Tamala Julian for the left groin pain (although you can make an appt with him at the scheduling desk as you leave as well)  You will be contacted regarding the referral for: stress test, and colonoscopy  Please stop drinking alcohol  Please return in 6 months, or sooner if needed

## 2016-11-24 DIAGNOSIS — L400 Psoriasis vulgaris: Secondary | ICD-10-CM | POA: Diagnosis not present

## 2016-11-25 ENCOUNTER — Encounter: Payer: Self-pay | Admitting: Internal Medicine

## 2016-11-25 MED ORDER — ALPRAZOLAM 1 MG PO TABS: ORAL_TABLET | ORAL | 5 refills | Status: DC

## 2016-11-25 NOTE — Telephone Encounter (Signed)
Done hardcopy to Corinne  

## 2016-11-28 DIAGNOSIS — R079 Chest pain, unspecified: Secondary | ICD-10-CM | POA: Insufficient documentation

## 2016-11-28 NOTE — Assessment & Plan Note (Signed)
Asympt, a1c normal,  to f/u any worsening symptoms or concerns, cont diet and wt control efforts

## 2016-11-28 NOTE — Assessment & Plan Note (Signed)
Urged to minimize use

## 2016-11-28 NOTE — Assessment & Plan Note (Signed)
High suspicion for significant hip arthritis, for film, also refer sports medicine,  to f/u any worsening symptoms or concerns

## 2016-11-28 NOTE — Assessment & Plan Note (Addendum)
Atypical, for stress test, cont same tx  In addition to the time spent performing CPE, I spent an additional 25 minutes face to face,in which greater than 50% of this time was spent in counseling and coordination of care for patient's acute illness as documented.

## 2016-11-28 NOTE — Assessment & Plan Note (Signed)

## 2016-11-28 NOTE — Assessment & Plan Note (Signed)
Mild uncontrolled, for PPI

## 2016-12-10 ENCOUNTER — Ambulatory Visit: Payer: Self-pay

## 2016-12-10 ENCOUNTER — Ambulatory Visit (INDEPENDENT_AMBULATORY_CARE_PROVIDER_SITE_OTHER): Payer: 59 | Admitting: Family Medicine

## 2016-12-10 VITALS — BP 132/106 | HR 98 | Ht 74.0 in | Wt 193.6 lb

## 2016-12-10 DIAGNOSIS — M1612 Unilateral primary osteoarthritis, left hip: Secondary | ICD-10-CM | POA: Diagnosis not present

## 2016-12-10 DIAGNOSIS — M25552 Pain in left hip: Secondary | ICD-10-CM | POA: Diagnosis not present

## 2016-12-10 MED ORDER — TRAMADOL HCL 50 MG PO TABS: 50.0000 mg | ORAL_TABLET | Freq: Three times a day (TID) | ORAL | 0 refills | Status: DC | PRN

## 2016-12-10 NOTE — Patient Instructions (Addendum)
Good to meet you Sorry for the bad news.  Ice 20 minutes 2 times daily. Usually after activity and before bed. Tylenol 500mg  3 times a day.  If you take the vicodin then skip the dose of tylenol.  Tuemrice 500mg  daily  Tart cherry extract any dose at night Vitamin D 2000 IU daily  We injected the hip today  See me again in 10 weeks if you need another injection.

## 2016-12-10 NOTE — Assessment & Plan Note (Addendum)
Consent significant arthritis of the hip. We discussed icing regimen and home exercises. Given an injection today. Patient given up her scheduled for tramadol but will stop his Vicodin isn't. Patient states that he is taking more Vicodin and he was prescribed. Discussed that he would needed talk to primary care provider if he'll like to go back to the Vicodin. Any increase in tramadol with also need a referral to pain management. We discussed feeling curative measure will be joint replacement. Patient wants to hold off until October potentially. We will do injections every 10 weeks if helpful

## 2016-12-10 NOTE — Progress Notes (Signed)
[facility], [place] [address] Phone: [phone] Subjective:    I'm seeing this patient by the request  of:  Randy Cower, MD   CC: Left groin pain.  [id number]  Randy Walker is a 61 y.o. male coming in with complaint of left groin pain. Past medical history significant for psoriatic arthritis. Patient states it hasn't been given some trouble for quite some time. Seems to be worsening over the course last several months and even weeks. Patient states affecting daily activities. Difficulty though up-and-down stairs. Unable to walk distances secondary to pain and sometimes having pain at night. Dull throbbing aching pain near any type of weightbearing with some sharp pain with certain movements. Denies any significant radiation down the legs or any numbness or tingling. Rates severity pain is 8 out of 10.   patient did have x-rays 11/24/2016 that were independently visualized by me. 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  Past Medical History:  Diagnosis Date  . Adenomatous polyps 11/17/2011  . Alcohol use 11/17/2011  . ALLERGIC RHINITIS 09/10/2007  . ANXIETY 09/10/2007  . BACK PAIN 12/31/2010  . Reader DISEASE, LUMBAR 12/31/2010  . GERD (gastroesophageal reflux disease) 11/23/2016  . Hyperglycemia 11/23/2016  . HYPERLIPIDEMIA 12/31/2010  . Personal history of rectal adenomas 01/29/2008  . PSORIASIS 12/31/2010  . PULMONARY EMBOLISM, HX OF 09/10/2007  . RASH-NONVESICULAR 04/23/2009  . SMOKER 12/31/2010   Past Surgical History:  Procedure Laterality Date  . ROTATOR CUFF REPAIR  Jan, 2010   [facility], Left  . spleen repair     Social History   Social History  . Marital status: Married    Spouse name: N/A  . Number of children: N/A  . Years of education: N/A   Occupational History  . restaurant owner    Social History Main Topics  . Smoking status: Current Every Day Smoker  .  Smokeless tobacco: Not on file  . Alcohol use Yes     Comment: 3-4 cocktail per day  . Drug use: No  . Sexual activity: Not on file   Other Topics Concern  . Not on file   Social History Narrative  . No narrative on file   No Known Allergies No family history on file. Family history rheumatological diseases. Patient's father did have a hip replacement at 73 years of age.  Past medical history, social, surgical and family history all reviewed in electronic medical record.  No pertanent information unless stated regarding to the chief complaint.   Review of Systems:Review of systems updated and as accurate as of 12/10/16  No headache, visual changes, nausea, vomiting, diarrhea, constipation, dizziness, abdominal pain, skin rash, fevers, chills, night sweats, weight loss, swollen lymph nodes, body achesshortness of breath, mood changes.   Objective  Blood pressure (!) 132/106, pulse 98, height 6\' 2"  (1.88 m), weight 193 lb 9.6 oz (87.8 kg). Systems examined below as of 12/10/16   General: No apparent distress alert and oriented x3 mood and affect normal, dressed appropriately.  HEENT: Pupils equal, extraocular movements intact  Respiratory: Patient's speak in full sentences and does not appear short of breath  Cardiovascular: No lower extremity edema, non tender, no erythema  Skin: Warm dry intact with no signs of infection or rash on extremities or on axial skeleton.  Abdomen: Soft nontender  Neuro: Cranial nerves II through XII are intact, neurovascularly intact in all extremities  with 2+ DTRs and 2+ pulses.  Lymph: No lymphadenopathy of posterior or anterior cervical chain or axillae bilaterally.  Gait antalgic MSK:  Non tender with full range of motion and good stability and symmetric strength and tone of shoulders, elbows, wrist,  knee and ankles bilaterally.   Hip: Left ROM IR: 5 Deg with worsening pain severe crepitus.  ER: 35 Deg, Flexion: 100 Deg, Extension: 80 Deg,  Abduction: 45 Deg, Adduction: 25 Deg Strength IR: 4/5, ER: 5/5, Flexion: 5/5, Extension: 5/5, Abduction: 5/5, Adduction: 3/5 Pelvic alignment unremarkable to inspection and palpation. Standing hip rotation and gait without trendelenburg sign / unsteadiness. Greater trochanter without tenderness to palpation. No tenderness over piriformis and greater trochanter. Pain with FADIR and mild with FABER. No SI joint tenderness and normal minimal SI movement. Contralateral hip unremarkable. Pain and appears paraspinal musculature of the lumbar spine.  Procedure: Real-time Ultrasound Guided Injection of left intra-articular hip Device: GE Logiq Q7  Ultrasound guided injection is preferred based studies that show increased duration, increased effect, greater accuracy, decreased procedural pain, increased response rate with ultrasound guided versus blind injection.  Verbal informed consent obtained.  Time-out conducted.  Noted no overlying erythema, induration, or other signs of local infection.  Skin prepped in a sterile fashion.  Local anesthesia: Topical Ethyl chloride.  With sterile technique and under real time ultrasound guidance:  Anterior capsule visualized, needle visualized going to the head neck junction at the anterior capsule. Pictures taken. Patient did have injection of 3 cc of 0.5% Marcaine, and 1 cc of Kenalog 40 mg/dL. Completed without difficulty  Pain immediately resolved suggesting accurate placement of the medication.  Advised to call if fevers/chills, erythema, induration, drainage, or persistent bleeding.  Images permanently stored and available for review in the ultrasound unit.  Impression: Technically successful ultrasound guided injection.   Impression and Recommendations:     This case required medical decision making of moderate complexity.      Note: This dictation was prepared with Dragon dictation along with smaller phrase technology. Any transcriptional  errors that result from this process are unintentional.

## 2016-12-16 ENCOUNTER — Telehealth (HOSPITAL_COMMUNITY): Payer: Self-pay | Admitting: Internal Medicine

## 2016-12-16 NOTE — Telephone Encounter (Signed)
I spoke with patient and he voiced that he had spoken with Dr. Charlann Boxer and he will be having hip surgery in July or August and wanted to get the stress test closer to that date. I will be removing him from the workqueue so a new order for pre surgical clearance can be placed by the proper doctor.

## 2017-01-21 ENCOUNTER — Encounter: Payer: Self-pay | Admitting: Internal Medicine

## 2017-01-21 MED ORDER — HYDROCODONE-ACETAMINOPHEN 7.5-325 MG PO TABS: 1.0000 | ORAL_TABLET | Freq: Every day | ORAL | 0 refills | Status: DC | PRN

## 2017-01-21 NOTE — Telephone Encounter (Signed)
Done hardcopy to Vicente Males - x 3 rx

## 2017-01-21 NOTE — Telephone Encounter (Signed)
Place scripts up front for pick=up...Randy Walker

## 2017-01-24 ENCOUNTER — Ambulatory Visit (AMBULATORY_SURGERY_CENTER): Payer: Self-pay

## 2017-01-24 VITALS — Ht 74.0 in | Wt 192.8 lb

## 2017-01-24 DIAGNOSIS — Z86010 Personal history of colonic polyps: Secondary | ICD-10-CM

## 2017-01-24 NOTE — Progress Notes (Signed)
Denies allergies to eggs or soy products. Denies complication of anesthesia or sedation. Denies use of weight loss medication. Denies use of O2.   Emmi instructions declined.   Patient noted that he told Dr. Jenny Reichmann that was having chest pain. Dr. Jenny Reichmann wanted the patient to have a stress test but patient declined. Dr. Hilarie Fredrickson reviewed in Dr. Celesta Aver absence and recommended that the patient have the stress test prior to having a colonoscopy. Patient left before colonoscopy instructions were completed.   Riki Sheer, LPN

## 2017-01-26 ENCOUNTER — Encounter: Payer: Self-pay | Admitting: Internal Medicine

## 2017-02-09 ENCOUNTER — Encounter: Payer: 59 | Admitting: Internal Medicine

## 2017-02-09 ENCOUNTER — Telehealth: Payer: Self-pay | Admitting: Internal Medicine

## 2017-02-22 NOTE — Progress Notes (Signed)
[name] [place], [place] [address] Phone: [phone] Subjective:    I'm seeing this patient by the request  of:  Cathlean Cower, MD   CC: Left groin pain f/u  [id number]  Randy Walker is a 61 y.o. male coming in with complaint of left groin pain. Past medical history significant for psoriatic arthritis. Patient did have severe arthritic changes of the left hip. Patient was given an injection. States that he did not have any significant improvement. Patient states that this is not a good time to have a replacement. Is taking ibuprofen as well as Tylenol on a regular basis. Patient states that this without causing a limiting that seems to help him. Patient is still having to work and stand 12-14 hours daily which seems to aggravate it. Denies any increasing weakness.   patient did have x-rays 11/24/2016 that were independently visualized by me. 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  Past Medical History:  Diagnosis Date  . Adenomatous polyps 11/17/2011  . Alcohol use 11/17/2011  . ALLERGIC RHINITIS 09/10/2007  . ANXIETY 09/10/2007  . BACK PAIN 12/31/2010  . [place]ran DISEASE, LUMBAR 12/31/2010  . GERD (gastroesophageal reflux disease) 11/23/2016  . Hyperglycemia 11/23/2016  . HYPERLIPIDEMIA 12/31/2010  . Personal history of rectal adenomas 01/29/2008  . PSORIASIS 12/31/2010  . PULMONARY EMBOLISM, HX OF 09/10/2007  . RASH-NONVESICULAR 04/23/2009  . SMOKER 12/31/2010   Past Surgical History:  Procedure Laterality Date  . ROTATOR CUFF REPAIR  Jan, 2010   [facility], Left  . spleen repair     Social History   Social History  . Marital status: Married    Spouse name: N/A  . Number of children: N/A  . Years of education: N/A   Occupational History  . restaurant owner    Social History Main Topics  . Smoking status: Current Every Day Smoker  . Smokeless tobacco: Never Used  . Alcohol  use Yes     Comment: 3 or 4 red wine.  . Drug use: No  . Sexual activity: Not Asked   Other Topics Concern  . None   Social History Narrative  . None   No Known Allergies Family History  Problem Relation Age of Onset  . Colon cancer Neg Hx   . Esophageal cancer Neg Hx   . Rectal cancer Neg Hx   . Stomach cancer Neg Hx    Family history rheumatological diseases. Patient's father did have a hip replacement at 72 years of age.  Past medical history, social, surgical and family history all reviewed in electronic medical record.  No pertanent information unless stated regarding to the chief complaint.   Review of Systems: No headache, visual changes, nausea, vomiting, diarrhea, constipation, dizziness, abdominal pain, skin rash, fevers, chills, night sweats, weight loss, swollen lymph nodes,chest pain, shortness of breath, mood changes.  Positive muscle and body aches  Objective  Blood pressure (!) 146/100, pulse 95, resp. rate 16, weight 191 lb (86.6 kg), SpO2 98 %.    Systems examined below as of 02/23/17 General: NAD A&O x3 mood, affect normal  HEENT: Pupils equal, extraocular movements intact no nystagmus Respiratory: not short of breath at rest or with speaking Cardiovascular: No lower extremity edema, non tender Skin:Mild psoriatic plaques noted Abdomen: Soft nontender, no masses Neuro: Cranial nerves  intact, neurovascularly intact in all extremities with 2+ DTRs and 2+ pulses. Lymph: No  lymphadenopathy appreciated today  GaitAntalgic gait MSK: Non tender with full range of motion and good stability and symmetric strength and tone of shoulders, elbows, wrist,  knee and ankles bilaterally.  .   Hip: Left ROM IR: 5 Deg with worsening pain severe crepitus. Worsening from previous exam  ER: 35 Deg, Flexion: 100 Deg, Extension: 80 Deg, Abduction: 45 Deg, Adduction: 25 Deg Strength IR: 4/5, ER: 5/5, Flexion: 5/5, Extension: 5/5, Abduction: 5/5, Adduction: 3/5 Pelvic alignment  unremarkable to inspection and palpation. Standing hip rotation and gait without trendelenburg sign / unsteadiness. Greater trochanter without tenderness to palpation. No tenderness over piriformis and greater trochanter. Severe pain with internal rotation No SI joint tenderness and normal minimal SI movement. Contralateral hip unremarkable. Severe pain in the back as well as especially the per spinal musculature.     Impression and Recommendations:     This case required medical decision making of moderate complexity.      Note: This dictation was prepared with Dragon dictation along with smaller phrase technology. Any transcriptional errors that result from this process are unintentional.

## 2017-02-23 ENCOUNTER — Ambulatory Visit (INDEPENDENT_AMBULATORY_CARE_PROVIDER_SITE_OTHER): Payer: 59 | Admitting: Family Medicine

## 2017-02-23 ENCOUNTER — Encounter: Payer: Self-pay | Admitting: Family Medicine

## 2017-02-23 DIAGNOSIS — M1612 Unilateral primary osteoarthritis, left hip: Secondary | ICD-10-CM | POA: Diagnosis not present

## 2017-02-23 MED ORDER — GABAPENTIN 100 MG PO CAPS: 200.0000 mg | ORAL_CAPSULE | Freq: Every day | ORAL | 3 refills | Status: DC

## 2017-02-23 NOTE — Progress Notes (Signed)
Pre-visit discussion using our clinic review tool. No additional management support is needed unless otherwise documented below in the visit note.  

## 2017-02-23 NOTE — Assessment & Plan Note (Signed)
Severe bone-on-bone. Patient does have chronic narcotics from primary care provider. Discussed with him I do not think anything more aggressive at be beneficial. Patient given some gabapentin to see if this would help with any of the nighttime symptoms. In addition of this we gave him a trial of a oral anti-inflammatory that may be safer for him using a chronically. Patient is to right back in one week and we'll see how he does. We discussed icing regimen, proper shoes, proper orthotics. Patient was to hold on any type of hip replacement at this time. Discussed with him about potentially waiting too long. Patient understands this. Patient wants to try any type of injection and then he can come back.

## 2017-02-23 NOTE — Patient Instructions (Signed)
Good to see you  I am sorry I do not have the answer Try the duexis 3 times a day for 6 days with 1 extra strength tylenol  Send message in 6 weeks and if not better then lets consider meloxicam  Gabapentin if you need some help at night  If you want to try an injection I am here Dr. Frederik Pear would be my pick See me again when you need me.

## 2017-04-11 NOTE — Telephone Encounter (Signed)
Appt should of been cancelled at previsit appointment.

## 2017-04-21 ENCOUNTER — Other Ambulatory Visit: Payer: Self-pay | Admitting: Internal Medicine

## 2017-04-21 MED ORDER — HYDROCODONE-ACETAMINOPHEN 7.5-325 MG PO TABS: 1.0000 | ORAL_TABLET | Freq: Every day | ORAL | 0 refills | Status: DC | PRN

## 2017-04-21 NOTE — Telephone Encounter (Signed)
Pt has been informed.

## 2017-04-21 NOTE — Telephone Encounter (Signed)
Done hardcopy to [facility]  

## 2017-05-13 ENCOUNTER — Other Ambulatory Visit: Payer: Self-pay | Admitting: Internal Medicine

## 2017-05-17 NOTE — Telephone Encounter (Signed)
Faxed

## 2017-05-17 NOTE — Telephone Encounter (Signed)
Done hardcopy to [facility]  

## 2017-05-23 ENCOUNTER — Other Ambulatory Visit: Payer: Self-pay | Admitting: Internal Medicine

## 2017-05-24 MED ORDER — HYDROCODONE-ACETAMINOPHEN 7.5-325 MG PO TABS: 1.0000 | ORAL_TABLET | Freq: Every day | ORAL | 0 refills | Status: DC | PRN

## 2017-05-24 NOTE — Telephone Encounter (Signed)
Done hardcopy to [facility]  

## 2017-05-24 NOTE — Telephone Encounter (Signed)
Informed pt Script at front desk  

## 2017-06-20 ENCOUNTER — Other Ambulatory Visit: Payer: Self-pay | Admitting: Internal Medicine

## 2017-06-20 NOTE — Telephone Encounter (Signed)
MD is out of office will forward to his desktop for his approval for tomorrow once he return. Check [place] registry last filled 04/21/2017 . Pls advise on msg below...Randy Walker

## 2017-06-21 ENCOUNTER — Telehealth: Payer: Self-pay

## 2017-06-21 MED ORDER — HYDROCODONE-ACETAMINOPHEN 7.5-325 MG PO TABS: 1.0000 | ORAL_TABLET | Freq: Every day | ORAL | 0 refills | Status: DC | PRN

## 2017-06-21 NOTE — Telephone Encounter (Signed)
Called pt, LVM Script at front desk

## 2017-06-21 NOTE — Telephone Encounter (Signed)
Done hardcopy to [facility]  

## 2017-06-21 NOTE — Telephone Encounter (Signed)
   Pt should have rx ready with dispense date of Jun 23, 2017 ready to pick up already

## 2017-06-21 NOTE — Telephone Encounter (Signed)
Patient called requesting to change hydrocodone rx fill date from 06/23/17 to today (06/21/17)---patient states he is going out of town and needs to fill rx today---per dr john---ok for patient to fill today, tammy/sched is advising pharmacy ok to fill rx today

## 2017-07-14 ENCOUNTER — Telehealth: Payer: Self-pay | Admitting: Internal Medicine

## 2017-07-14 DIAGNOSIS — M1612 Unilateral primary osteoarthritis, left hip: Secondary | ICD-10-CM

## 2017-07-14 NOTE — Telephone Encounter (Signed)
Patients wife states they need a referral sent over for patients hip surgery.   [facility] -- Dr. Berenice Primas  Thank you.

## 2017-07-14 NOTE — Telephone Encounter (Signed)
Referral faxed to [facility].

## 2017-07-21 ENCOUNTER — Telehealth: Payer: Self-pay | Admitting: Internal Medicine

## 2017-07-21 DIAGNOSIS — M25552 Pain in left hip: Secondary | ICD-10-CM | POA: Diagnosis not present

## 2017-07-21 MED ORDER — HYDROCODONE-ACETAMINOPHEN 7.5-325 MG PO TABS: 1.0000 | ORAL_TABLET | Freq: Every day | ORAL | 0 refills | Status: DC | PRN

## 2017-07-21 NOTE — Telephone Encounter (Signed)
HYDROcodone-acetaminophen (NORCO) 7.5-325 MG tablet  Patient is flighting out of the [place] on 9[place]2, he would like to know if he can get his RX by tomorrow. He is aware it is early. Please advise. Thank you.

## 2017-07-21 NOTE — Telephone Encounter (Signed)
Done hardcopy to [facility]  

## 2017-07-21 NOTE — Telephone Encounter (Signed)
Pt informed Script at front desk 

## 2017-07-22 ENCOUNTER — Other Ambulatory Visit: Payer: Self-pay | Admitting: Orthopedic Surgery

## 2017-08-22 ENCOUNTER — Other Ambulatory Visit: Payer: Self-pay | Admitting: Internal Medicine

## 2017-08-22 MED ORDER — HYDROCODONE-ACETAMINOPHEN 7.5-325 MG PO TABS: 1.0000 | ORAL_TABLET | Freq: Every day | ORAL | 0 refills | Status: DC | PRN

## 2017-08-22 NOTE — Telephone Encounter (Signed)
Done hardcopy to [facility]  

## 2017-08-23 NOTE — Telephone Encounter (Signed)
Script ready for pick up at the front desk

## 2017-09-02 NOTE — Patient Instructions (Signed)
Randy Walker  09/02/2017   Your procedure is scheduled on: 09/09/17  Report to [facility]  Entrance Take [place]  elevators to 3rd floor to  [place] at    [place] AM.       Call this number if you have problems the morning of surgery [phone]     Remember: ONLY 1 PERSON MAY GO WITH YOU TO SHORT STAY TO GET  READY MORNING OF [place].  Do not eat food or drink liquids :After Midnight.     Take these medicines the morning of surgery with A SIP OF WATER: protonix if needed                                 You may not have any metal on your body including hair pins and              piercings  Do not wear jewelry,  lotions, powders or perfumes, deodorant                          Men may shave face and neck.   Do not bring valuables to the hospital. [place].  Contacts, dentures or bridgework may not be worn into surgery.  Leave suitcase in the car. After surgery it may be brought to your room.                     Please read over the following fact sheets you were given: _____________________________________________________________________             [facility] - Preparing for Surgery Before surgery, you can play an important role.  Because skin is not sterile, your skin needs to be as free of germs as possible.  You can reduce the number of germs on your skin by washing with CHG (chlorahexidine gluconate) soap before surgery.  CHG is an antiseptic cleaner which kills germs and bonds with the skin to continue killing germs even after washing. Please DO NOT use if you have an allergy to CHG or antibacterial soaps.  If your skin becomes reddened/irritated stop using the CHG and inform your nurse when you arrive at Short Stay. Do not shave (including legs and underarms) for at least 48 hours prior to the first CHG shower.  You may shave your face/neck. Please follow these instructions  carefully:  1.  Shower with CHG Soap the night before surgery and the  morning of Surgery.  2.  If you choose to wash your hair, wash your hair first as usual with your  normal  shampoo.  3.  After you shampoo, rinse your hair and body thoroughly to remove the  shampoo.                           4.  Use CHG as you would any other liquid soap.  You can apply chg directly  to the skin and wash                       Gently with a scrungie or clean washcloth.  5.  Apply the CHG  Soap to your body ONLY FROM THE NECK DOWN.   Do not use on face/ open                           Wound or open sores. Avoid contact with eyes, ears mouth and genitals (private parts).                       Wash face,  Genitals (private parts) with your normal soap.             6.  Wash thoroughly, paying special attention to the area where your surgery  will be performed.  7.  Thoroughly rinse your body with warm water from the neck down.  8.  DO NOT shower/wash with your normal soap after using and rinsing off  the CHG Soap.                9.  Pat yourself dry with a clean towel.            10.  Wear clean pajamas.            11.  Place clean sheets on your bed the night of your first shower and do not  sleep with pets. Day of Surgery : Do not apply any lotions/deodorants the morning of surgery.  Please wear clean clothes to the hospital/surgery center.  FAILURE TO FOLLOW THESE INSTRUCTIONS MAY RESULT IN THE CANCELLATION OF YOUR SURGERY PATIENT SIGNATURE_________________________________  NURSE SIGNATURE__________________________________  ________________________________________________________________________  WHAT IS A BLOOD TRANSFUSION? Blood Transfusion Information  A transfusion is the replacement of blood or some of its parts. Blood is made up of multiple cells which provide different functions.  Red blood cells carry oxygen and are used for blood loss replacement.  White blood cells fight against  infection.  Platelets control bleeding.  Plasma helps clot blood.  Other blood products are available for specialized needs, such as hemophilia or other clotting disorders. BEFORE THE TRANSFUSION  Who gives blood for transfusions?   Healthy volunteers who are fully evaluated to make sure their blood is safe. This is blood bank blood. Transfusion therapy is the safest it has ever been in the practice of medicine. Before blood is taken from a donor, a complete history is taken to make sure that person has no history of diseases nor engages in risky social behavior (examples are intravenous drug use or sexual activity with multiple partners). The donor's travel history is screened to minimize risk of transmitting infections, such as malaria. The donated blood is tested for signs of infectious diseases, such as HIV and hepatitis. The blood is then tested to be sure it is compatible with you in order to minimize the chance of a transfusion reaction. If you or a relative donates blood, this is often done in anticipation of surgery and is not appropriate for emergency situations. It takes many days to process the donated blood. RISKS AND COMPLICATIONS Although transfusion therapy is very safe and saves many lives, the main dangers of transfusion include:   Getting an infectious disease.  Developing a transfusion reaction. This is an allergic reaction to something in the blood you were given. Every precaution is taken to prevent this. The decision to have a blood transfusion has been considered carefully by your caregiver before blood is given. Blood is not given unless the benefits outweigh the risks. AFTER THE TRANSFUSION  Right after receiving a blood transfusion, you  will usually feel much better and more energetic. This is especially true if your red blood cells have gotten low (anemic). The transfusion raises the level of the red blood cells which carry oxygen, and this usually causes an energy  increase.  The nurse administering the transfusion will monitor you carefully for complications. HOME CARE INSTRUCTIONS  No special instructions are needed after a transfusion. You may find your energy is better. Speak with your caregiver about any limitations on activity for underlying diseases you may have. SEEK MEDICAL CARE IF:   Your condition is not improving after your transfusion.  You develop redness or irritation at the intravenous (IV) site. SEEK IMMEDIATE MEDICAL CARE IF:  Any of the following symptoms occur over the next 12 hours:  Shaking chills.  You have a temperature by mouth above 102 F (38.9 C), not controlled by medicine.  Chest, back, or muscle pain.  People around you feel you are not acting correctly or are confused.  Shortness of breath or difficulty breathing.  Dizziness and fainting.  You get a rash or develop hives.  You have a decrease in urine output.  Your urine turns a dark color or changes to pink, red, or brown. Any of the following symptoms occur over the next 10 days:  You have a temperature by mouth above 102 F (38.9 C), not controlled by medicine.  Shortness of breath.  Weakness after normal activity.  The white part of the eye turns yellow (jaundice).  You have a decrease in the amount of urine or are urinating less often.  Your urine turns a dark color or changes to pink, red, or brown. Document Released: 11/05/2000 Document Revised: 01/31/2012 Document Reviewed: 06/24/2008 ExitCare Patient Information 2014 [place].  _______________________________________________________________________  Incentive Spirometer  An incentive spirometer is a tool that can help keep your lungs clear and active. This tool measures how well you are filling your lungs with each breath. Taking long deep breaths may help reverse or decrease the chance of developing breathing (pulmonary) problems (especially infection) following:  A long  period of time when you are unable to move or be active. BEFORE THE PROCEDURE   If the spirometer includes an indicator to show your best effort, your nurse or respiratory therapist will set it to a desired goal.  If possible, sit up straight or lean slightly forward. Try not to slouch.  Hold the incentive spirometer in an upright position. INSTRUCTIONS FOR USE  1. Sit on the edge of your bed if possible, or sit up as far as you can in bed or on a chair. 2. Hold the incentive spirometer in an upright position. 3. Breathe out normally. 4. Place the mouthpiece in your mouth and seal your lips tightly around it. 5. Breathe in slowly and as deeply as possible, raising the piston or the ball toward the top of the column. 6. Hold your breath for 3-5 seconds or for as long as possible. Allow the piston or ball to fall to the bottom of the column. 7. Remove the mouthpiece from your mouth and breathe out normally. 8. Rest for a few seconds and repeat Steps 1 through 7 at least 10 times every 1-2 hours when you are awake. Take your time and take a few normal breaths between deep breaths. 9. The spirometer may include an indicator to show your best effort. Use the indicator as a goal to work toward during each repetition. 10. After each set of 10 deep breaths, practice  coughing to be sure your lungs are clear. If you have an incision (the cut made at the time of surgery), support your incision when coughing by placing a pillow or rolled up towels firmly against it. Once you are able to get out of bed, walk around indoors and cough well. You may stop using the incentive spirometer when instructed by your caregiver.  RISKS AND COMPLICATIONS  Take your time so you do not get dizzy or light-headed.  If you are in pain, you may need to take or ask for pain medication before doing incentive spirometry. It is harder to take a deep breath if you are having pain. AFTER USE  Rest and breathe slowly and  easily.  It can be helpful to keep track of a log of your progress. Your caregiver can provide you with a simple table to help with this. If you are using the spirometer at home, follow these instructions: [place] IF:   You are having difficultly using the spirometer.  You have trouble using the spirometer as often as instructed.  Your pain medication is not giving enough relief while using the spirometer.  You develop fever of 100.5 F (38.1 C) or higher. SEEK IMMEDIATE MEDICAL CARE IF:   You cough up bloody sputum that had not been present before.  You develop fever of 102 F (38.9 C) or greater.  You develop worsening pain at or near the incision site. MAKE SURE YOU:   Understand these instructions.  Will watch your condition.  Will get help right away if you are not doing well or get worse. Document Released: 03/21/2007 Document Revised: 01/31/2012 Document Reviewed: 05/22/2007 [facility] Patient Information 2014 [place], [place].   ________________________________________________________________________

## 2017-09-05 ENCOUNTER — Other Ambulatory Visit: Payer: Self-pay | Admitting: Internal Medicine

## 2017-09-05 ENCOUNTER — Ambulatory Visit (HOSPITAL_COMMUNITY)
Admission: RE | Admit: 2017-09-05 | Discharge: 2017-09-05 | Disposition: A | Discharge status: Home / Self Care | Payer: 59 | Source: Ambulatory Visit | Attending: Orthopedic Surgery | Admitting: Orthopedic Surgery

## 2017-09-05 ENCOUNTER — Encounter (HOSPITAL_COMMUNITY): Payer: Self-pay

## 2017-09-05 ENCOUNTER — Encounter: Payer: Self-pay | Admitting: Internal Medicine

## 2017-09-05 ENCOUNTER — Encounter (HOSPITAL_COMMUNITY)
Admission: RE | Admit: 2017-09-05 | Discharge: 2017-09-05 | Disposition: A | Discharge status: Home / Self Care | Payer: 59 | Source: Ambulatory Visit | Attending: Orthopedic Surgery | Admitting: Orthopedic Surgery

## 2017-09-05 DIAGNOSIS — Z01810 Encounter for preprocedural cardiovascular examination: Secondary | ICD-10-CM | POA: Diagnosis not present

## 2017-09-05 DIAGNOSIS — Z01818 Encounter for other preprocedural examination: Secondary | ICD-10-CM | POA: Diagnosis not present

## 2017-09-05 DIAGNOSIS — M1612 Unilateral primary osteoarthritis, left hip: Secondary | ICD-10-CM | POA: Diagnosis not present

## 2017-09-05 DIAGNOSIS — R918 Other nonspecific abnormal finding of lung field: Secondary | ICD-10-CM | POA: Diagnosis not present

## 2017-09-05 DIAGNOSIS — R9431 Abnormal electrocardiogram [ECG] [EKG]: Secondary | ICD-10-CM | POA: Insufficient documentation

## 2017-09-05 DIAGNOSIS — I10 Essential (primary) hypertension: Secondary | ICD-10-CM | POA: Insufficient documentation

## 2017-09-05 LAB — CBC WITH DIFFERENTIAL/PLATELET
Basophils Absolute: 0 10*3/uL (ref 0.0–0.1)
Basophils Relative: 1 %
Eosinophils Absolute: 0.1 10*3/uL (ref 0.0–0.7)
Eosinophils Relative: 2 %
HCT: 41.6 % (ref 39.0–52.0)
Hemoglobin: 14.4 g/dL (ref 13.0–17.0)
Lymphocytes Relative: 33 %
Lymphs Abs: 1.8 10*3/uL (ref 0.7–4.0)
MCH: 33.3 pg (ref 26.0–34.0)
MCHC: 34.6 g/dL (ref 30.0–36.0)
MCV: 96.1 fL (ref 78.0–100.0)
Monocytes Absolute: 0.5 10*3/uL (ref 0.1–1.0)
Monocytes Relative: 8 %
Neutro Abs: 3.1 10*3/uL (ref 1.7–7.7)
Neutrophils Relative %: 56 %
Platelets: 188 10*3/uL (ref 150–400)
RBC: 4.33 MIL/uL (ref 4.22–5.81)
RDW: 12.8 % (ref 11.5–15.5)
WBC: 5.5 10*3/uL (ref 4.0–10.5)

## 2017-09-05 LAB — COMPREHENSIVE METABOLIC PANEL
ALT: 24 U/L (ref 17–63)
AST: 27 U/L (ref 15–41)
Albumin: 4.1 g/dL (ref 3.5–5.0)
Alkaline Phosphatase: 89 U/L (ref 38–126)
Anion gap: 11 (ref 5–15)
BUN: 13 mg/dL (ref 6–20)
CO2: 25 mmol/L (ref 22–32)
Calcium: 8.9 mg/dL (ref 8.9–10.3)
Chloride: 102 mmol/L (ref 101–111)
Creatinine, Ser: 0.73 mg/dL (ref 0.61–1.24)
GFR calc Af Amer: >60 mL/min (ref >60)
GFR calc non Af Amer: >60 mL/min (ref >60)
Glucose, Bld: 91 mg/dL (ref 65–99)
Potassium: 4 mmol/L (ref 3.5–5.1)
Sodium: 138 mmol/L (ref 135–145)
Total Bilirubin: 0.8 mg/dL (ref 0.3–1.2)
Total Protein: 7.1 g/dL (ref 6.5–8.1)

## 2017-09-05 LAB — ABO/RH: ABO/RH(D): O POS

## 2017-09-05 LAB — URINALYSIS, ROUTINE W REFLEX MICROSCOPIC
Bilirubin Urine: NEGATIVE
Glucose, UA: NEGATIVE mg/dL
Hgb urine dipstick: NEGATIVE
Ketones, ur: 5 mg/dL — AB
Leukocytes, UA: NEGATIVE
Nitrite: NEGATIVE
Protein, ur: NEGATIVE mg/dL
Specific Gravity, Urine: 1.023 (ref 1.005–1.030)
pH: 5 (ref 5.0–8.0)

## 2017-09-05 LAB — PROTIME-INR
INR: 0.94
Prothrombin Time: 12.5 seconds (ref 11.4–15.2)

## 2017-09-05 LAB — SURGICAL PCR SCREEN
MRSA, PCR: NEGATIVE
Staphylococcus aureus: NEGATIVE

## 2017-09-05 LAB — APTT: aPTT: 31 seconds (ref 24–36)

## 2017-09-05 MED ORDER — AMLODIPINE BESYLATE 5 MG PO TABS: 5.0000 mg | ORAL_TABLET | Freq: Every day | ORAL | 3 refills | Status: DC

## 2017-09-05 NOTE — Telephone Encounter (Signed)
We received information regarding mild HTN, apparently new onset today  BP Readings from Last 3 Encounters:  09/05/17 (!) 139/104  02/23/17 (!) 146/100  12/10/16 (!) 132/106   Chart review indicates mild elevated BP since jan 2018 on several occasions  If OK with pt - start amlodipine 5 mg per day (done erx);  This should not mean that he would put off surgury.  If reasonable after surgury, please ask pt to make ROV in 3 weeks after starting med

## 2017-09-05 NOTE — Progress Notes (Signed)
Blood pressure at preop appt was initially 150/102.  At end of preop blood pressure was 139/104.  Patient voices no complaints.  FYI.  Patient scheduled for surgery on 09/09/17 with Dr Berenice Primas.

## 2017-09-06 NOTE — Progress Notes (Signed)
Final EKG done 09/05/17-epic

## 2017-09-06 NOTE — Telephone Encounter (Signed)
Called pt, LVM.   

## 2017-09-08 ENCOUNTER — Encounter (HOSPITAL_COMMUNITY): Payer: Self-pay | Admitting: Anesthesiology

## 2017-09-08 NOTE — H&P (Addendum)
TOTAL HIP ADMISSION H&P  Patient is admitted for left total hip arthroplasty.  Subjective:  Chief Complaint: left hip pain  HPI: Randy Walker, 61 y.o. male, has a history of pain and functional disability in the left hip(s) due to arthritis and patient has failed non-surgical conservative treatments for greater than 12 weeks to include corticosteriod injections, weight reduction as appropriate and activity modification.  Onset of symptoms was gradual starting 2 years ago with gradually worsening course since that time.The patient noted no past surgery on the left hip(s).  Patient currently rates pain in the left hip at 8 out of 10 with activity. Patient has night pain, worsening of pain with activity and weight bearing, trendelenberg gait, pain that interfers with activities of daily living, pain with passive range of motion, crepitus and joint swelling. Patient has evidence of subchondral cysts, subchondral sclerosis and joint space narrowing by imaging studies. This condition presents safety issues increasing the risk of falls. This patient has had failure of all reasonable conservative care.  There is no current active infection.  Patient Active Problem List   Diagnosis Date Noted  . Hypertension   . Arthritis of left hip 12/10/2016  . Chest pain 11/28/2016  . GERD (gastroesophageal reflux disease) 11/23/2016  . Hyperglycemia 11/23/2016  . Left groin pain 11/23/2016  . Psoriatic arthritis ([place]) 09/30/2015  . Chronic low back pain 09/26/2014  . Encounter for well adult exam with abnormal findings 11/17/2011  . Alcohol use 11/17/2011  . HYPERLIPIDEMIA 12/31/2010  . SMOKER 12/31/2010  . Psoriasis 12/31/2010  . [place] DISEASE, LUMBAR 12/31/2010  . BACK PAIN 12/31/2010  . Personal history of rectal adenomas 01/29/2008  . Anxiety state 09/10/2007  . ALLERGIC RHINITIS 09/10/2007  . INSOMNIA 09/10/2007  . PULMONARY EMBOLISM, HX OF 09/10/2007   Past Medical History:  Diagnosis Date  .  Adenomatous polyps 11/17/2011  . Alcohol use 11/17/2011  . ALLERGIC RHINITIS 09/10/2007  . ANXIETY 09/10/2007  . BACK PAIN 12/31/2010  . [place] DISEASE, LUMBAR 12/31/2010  . GERD (gastroesophageal reflux disease) 11/23/2016  . Hyperglycemia 11/23/2016  . HYPERLIPIDEMIA 12/31/2010  . Hypertension   . Personal history of rectal adenomas 01/29/2008  . PSORIASIS 12/31/2010  . PULMONARY EMBOLISM, HX OF 09/10/2007  . RASH-NONVESICULAR 04/23/2009  . SMOKER 12/31/2010    Past Surgical History:  Procedure Laterality Date  . ROTATOR CUFF REPAIR  Jan, 2010   [facility], Left  . spleen repair      No current facility-administered medications for this encounter.    Current Outpatient Prescriptions  Medication Sig Dispense Refill Last Dose  . acetaminophen (TYLENOL) 325 MG tablet Take 650 mg by mouth every 6 (six) hours as needed for mild pain or moderate pain.     Marland Kitchen ALPRAZolam (XANAX) 1 MG tablet TAKE 1 TABLET BY MOUTH AT BEDTIME AS NEEDED (Patient taking differently: TAKE 1 TABLET BY MOUTH AT BEDTIME) 30 tablet 5   . HYDROcodone-acetaminophen (NORCO) 7.5-325 MG tablet Take 1 tablet by mouth daily as needed for moderate pain. (Patient taking differently: Take 1 tablet by mouth 2 (two) times daily as needed for moderate pain (pain). ) 30 tablet 0   . ibuprofen (ADVIL,MOTRIN) 200 MG tablet Take 400-600 mg by mouth every 8 (eight) hours as needed for moderate pain.     . pantoprazole (PROTONIX) 40 MG tablet Take 1 tablet (40 mg total) by mouth daily. (Patient taking differently: Take 40 mg by mouth daily as needed (indigestion). ) 90 tablet 3 Taking  .  amLODipine (NORVASC) 5 MG tablet Take 1 tablet (5 mg total) by mouth daily. 90 tablet 3   . gabapentin (NEURONTIN) 100 MG capsule Take 2 capsules (200 mg total) by mouth at bedtime. (Patient not taking: Reported on 08/31/2017) 60 capsule 3 Not Taking at Unknown time   No Known Allergies  Social History  Substance Use Topics  . Smoking status: Current Every Day Smoker     Packs/day: 0.50    Types: Cigarettes  . Smokeless tobacco: Never Used  . Alcohol use Yes     Comment: 3 beers daily     Family History  Problem Relation Age of Onset  . Colon cancer Neg Hx   . Esophageal cancer Neg Hx   . Rectal cancer Neg Hx   . Stomach cancer Neg Hx      ROS ROS: I have reviewed the patient's review of systems thoroughly and there are no positive responses as relates to the HPI. Objective:  Physical Exam  Vital signs in last 24 hours:    Vitals:   09/09/17 0556 09/09/17 0624  BP: (!) 154/107 (!) 149/102  Pulse: 80 68  Resp: 18 18  Temp: 98.4 F (36.9 C) 98.5 F (36.9 C)  SpO2: 100% 99%   Well-developed well-nourished patient in no acute distress. Alert and oriented x3 HEENT:within normal limits Cardiac: Regular rate and rhythm Pulmonary: Lungs clear to auscultation Abdomen: Soft and nontender.  Normal active bowel sounds  Musculoskeletal: (Left hip: Painful range of motion.  Limited range of motion.  Limited internal rotation.  Neurovascularly intact distally. Labs: Recent Results (from the past 2160 hour(s))  Urinalysis, Routine w reflex microscopic     Status: Abnormal   Collection Time: 09/05/17  1:30 PM  Result Value Ref Range   Color, Urine YELLOW YELLOW   APPearance CLEAR CLEAR   Specific Gravity, Urine 1.023 1.005 - 1.030   pH 5.0 5.0 - 8.0   Glucose, UA NEGATIVE NEGATIVE mg/dL   Hgb urine dipstick NEGATIVE NEGATIVE   Bilirubin Urine NEGATIVE NEGATIVE   Ketones, ur 5 (A) NEGATIVE mg/dL   Protein, ur NEGATIVE NEGATIVE mg/dL   Nitrite NEGATIVE NEGATIVE   Leukocytes, UA NEGATIVE NEGATIVE  Surgical pcr screen     Status: None   Collection Time: 09/05/17  1:30 PM  Result Value Ref Range   MRSA, PCR NEGATIVE NEGATIVE   Staphylococcus aureus NEGATIVE NEGATIVE    Comment: (NOTE) The Xpert SA Assay (FDA approved for NASAL specimens in patients 67 years of age and older), is one component of a comprehensive surveillance program. It  is not intended to diagnose infection nor to guide or monitor treatment.   Type and screen Order type and screen if day of surgery is less than 15 days from draw of preadmission visit or order morning of surgery if day of surgery is greater than 6 days from preadmission visit.     Status: None   Collection Time: 09/05/17  1:55 PM  Result Value Ref Range   ABO/RH(D) O POS    Antibody Screen NEG    Sample Expiration 09/19/2017    Extend sample reason NO TRANSFUSIONS OR PREGNANCY IN THE PAST 3 MONTHS   ABO/Rh     Status: None   Collection Time: 09/05/17  1:55 PM  Result Value Ref Range   ABO/RH(D) O POS   APTT     Status: None   Collection Time: 09/05/17  2:04 PM  Result Value Ref Range   aPTT 31 24 -  36 seconds  CBC WITH DIFFERENTIAL     Status: None   Collection Time: 09/05/17  2:04 PM  Result Value Ref Range   WBC 5.5 4.0 - 10.5 K/uL   RBC 4.33 4.22 - 5.81 MIL/uL   Hemoglobin 14.4 13.0 - 17.0 g/dL   HCT 41.6 39.0 - 52.0 %   MCV 96.1 78.0 - 100.0 fL   MCH 33.3 26.0 - 34.0 pg   MCHC 34.6 30.0 - 36.0 g/dL   RDW 12.8 11.5 - 15.5 %   Platelets 188 150 - 400 K/uL   Neutrophils Relative % 56 %   Neutro Abs 3.1 1.7 - 7.7 K/uL   Lymphocytes Relative 33 %   Lymphs Abs 1.8 0.7 - 4.0 K/uL   Monocytes Relative 8 %   Monocytes Absolute 0.5 0.1 - 1.0 K/uL   Eosinophils Relative 2 %   Eosinophils Absolute 0.1 0.0 - 0.7 K/uL   Basophils Relative 1 %   Basophils Absolute 0.0 0.0 - 0.1 K/uL  Comprehensive metabolic panel     Status: None   Collection Time: 09/05/17  2:04 PM  Result Value Ref Range   Sodium 138 135 - 145 mmol/L   Potassium 4.0 3.5 - 5.1 mmol/L   Chloride 102 101 - 111 mmol/L   CO2 25 22 - 32 mmol/L   Glucose, Bld 91 65 - 99 mg/dL   BUN 13 6 - 20 mg/dL   Creatinine, Ser 0.73 0.61 - 1.24 mg/dL   Calcium 8.9 8.9 - 10.3 mg/dL   Total Protein 7.1 6.5 - 8.1 g/dL   Albumin 4.1 3.5 - 5.0 g/dL   AST 27 15 - 41 U/L   ALT 24 17 - 63 U/L   Alkaline Phosphatase 89 38 - 126 U/L    Total Bilirubin 0.8 0.3 - 1.2 mg/dL   GFR calc non Af Amer >60 >60 mL/min   GFR calc Af Amer >60 >60 mL/min    Comment: (NOTE) The eGFR has been calculated using the CKD EPI equation. This calculation has not been validated in all clinical situations. eGFR's persistently <60 mL/min signify possible Chronic Kidney Disease.    Anion gap 11 5 - 15  Protime-INR     Status: None   Collection Time: 09/05/17  2:04 PM  Result Value Ref Range   Prothrombin Time 12.5 11.4 - 15.2 seconds   INR 0.94     Estimated body mass index is 24.39 kg/m as calculated from the following:   Height as of 09/05/17: '6\' 2"'  (1.88 m).   Weight as of 09/05/17: 86.2 kg (190 lb).   Imaging Review Plain radiographs demonstrate severe degenerative joint disease of the left hip(s). The bone quality appears to be fair for age and reported activity level.  Assessment/Plan:  End stage arthritis, left hip(s)  The patient history, physical examination, clinical judgement of the provider and imaging studies are consistent with end stage degenerative joint disease of the left hip(s) and total hip arthroplasty is deemed medically necessary. The treatment options including medical management, injection therapy, arthroscopy and arthroplasty were discussed at length. The risks and benefits of total hip arthroplasty were presented and reviewed. The risks due to aseptic loosening, infection, stiffness, dislocation/subluxation,  thromboembolic complications and other imponderables were discussed.  The patient acknowledged the explanation, agreed to proceed with the plan and consent was signed. Patient is being admitted for inpatient treatment for surgery, pain control, PT, OT, prophylactic antibiotics, VTE prophylaxis, progressive ambulation and ADL's and discharge planning.The  patient is planning to be discharged home with home health services

## 2017-09-08 NOTE — Anesthesia Preprocedure Evaluation (Addendum)
Anesthesia Evaluation  Patient identified by MRN, date of birth, ID band Patient awake    Reviewed: Allergy & Precautions, NPO status , Patient's Chart, lab work & pertinent test results, reviewed documented beta blocker date and time   Airway Mallampati: II  TM Distance: >3 FB Neck ROM: Full    Dental no notable dental hx. (+) Teeth Intact   Pulmonary Current Smoker,    Pulmonary exam normal breath sounds clear to auscultation       Cardiovascular hypertension, Pt. on medications and Pt. on home beta blockers Normal cardiovascular exam Rhythm:Regular Rate:Normal     Neuro/Psych Anxiety negative neurological ROS     GI/Hepatic Neg liver ROS, GERD  Medicated and Controlled,  Endo/Other  Hyperlipidemia   Renal/GU negative Renal ROS  negative genitourinary   Musculoskeletal  (+) Arthritis , Osteoarthritis,  OA left hip Psoriatic arthritis   Abdominal   Peds  Hematology negative hematology ROS (+)   Anesthesia Other Findings   Reproductive/Obstetrics                            Anesthesia Physical Anesthesia Plan  ASA: II  Anesthesia Plan: Spinal   Post-op Pain Management:    Induction:   PONV Risk Score and Plan: 2 and Ondansetron, Dexamethasone and Treatment may vary due to age or medical condition  Airway Management Planned: Natural Airway, Nasal Cannula and Simple Face Mask  Additional Equipment:   Intra-op Plan:   Post-operative Plan:   Informed Consent: I have reviewed the patients History and Physical, chart, labs and discussed the procedure including the risks, benefits and alternatives for the proposed anesthesia with the patient or authorized representative who has indicated his/her understanding and acceptance.   Dental advisory given  Plan Discussed with: CRNA, Anesthesiologist and Surgeon  Anesthesia Plan Comments:        Anesthesia Quick Evaluation

## 2017-09-09 ENCOUNTER — Inpatient Hospital Stay (HOSPITAL_COMMUNITY): Payer: 59 | Admitting: Anesthesiology

## 2017-09-09 ENCOUNTER — Encounter (HOSPITAL_COMMUNITY): Payer: Self-pay | Admitting: Anesthesiology

## 2017-09-09 ENCOUNTER — Inpatient Hospital Stay (HOSPITAL_COMMUNITY)
Admission: RE | Admit: 2017-09-09 | Discharge: 2017-09-11 | DRG: 470 | Disposition: A | Discharge status: Home / Self Care | Payer: 59 | Source: Ambulatory Visit | Attending: Orthopedic Surgery | Admitting: Orthopedic Surgery

## 2017-09-09 ENCOUNTER — Encounter (HOSPITAL_COMMUNITY)
Admission: RE | Disposition: A | Discharge status: Home / Self Care | Payer: Self-pay | Source: Ambulatory Visit | Attending: Orthopedic Surgery

## 2017-09-09 ENCOUNTER — Inpatient Hospital Stay (HOSPITAL_COMMUNITY): Payer: 59

## 2017-09-09 DIAGNOSIS — E785 Hyperlipidemia, unspecified: Secondary | ICD-10-CM | POA: Diagnosis not present

## 2017-09-09 DIAGNOSIS — I10 Essential (primary) hypertension: Secondary | ICD-10-CM | POA: Diagnosis not present

## 2017-09-09 DIAGNOSIS — K219 Gastro-esophageal reflux disease without esophagitis: Secondary | ICD-10-CM | POA: Diagnosis not present

## 2017-09-09 DIAGNOSIS — M1612 Unilateral primary osteoarthritis, left hip: Secondary | ICD-10-CM | POA: Diagnosis not present

## 2017-09-09 DIAGNOSIS — D62 Acute posthemorrhagic anemia: Secondary | ICD-10-CM | POA: Diagnosis not present

## 2017-09-09 DIAGNOSIS — Z79899 Other long term (current) drug therapy: Secondary | ICD-10-CM | POA: Diagnosis not present

## 2017-09-09 DIAGNOSIS — F17210 Nicotine dependence, cigarettes, uncomplicated: Secondary | ICD-10-CM | POA: Diagnosis present

## 2017-09-09 DIAGNOSIS — M25552 Pain in left hip: Secondary | ICD-10-CM

## 2017-09-09 DIAGNOSIS — M5136 Other intervertebral disc degeneration, lumbar region: Secondary | ICD-10-CM | POA: Diagnosis present

## 2017-09-09 DIAGNOSIS — Z86711 Personal history of pulmonary embolism: Secondary | ICD-10-CM | POA: Diagnosis not present

## 2017-09-09 DIAGNOSIS — F411 Generalized anxiety disorder: Secondary | ICD-10-CM | POA: Diagnosis present

## 2017-09-09 HISTORY — PX: TOTAL HIP ARTHROPLASTY: SHX124

## 2017-09-09 LAB — TYPE AND SCREEN
ABO/RH(D): O POS
Antibody Screen: NEGATIVE

## 2017-09-09 SURGERY — ARTHROPLASTY, HIP, TOTAL, ANTERIOR APPROACH
Anesthesia: Spinal | Site: Hip | Laterality: Left

## 2017-09-09 MED ORDER — MAGNESIUM CITRATE PO SOLN: 1.0000 | Freq: Once | ORAL | Status: DC | PRN

## 2017-09-09 MED ORDER — SODIUM CHLORIDE 0.9 % IV SOLN
INTRAVENOUS | Status: DC
  Administered 2017-09-09 – 2017-09-10 (×2): via INTRAVENOUS

## 2017-09-09 MED ORDER — OXYCODONE HCL 5 MG PO TABS
5.0000 mg | ORAL_TABLET | ORAL | Status: DC | PRN
  Administered 2017-09-09 (×2): 10 mg via ORAL
  Administered 2017-09-09: 13:00:00 5 mg via ORAL
  Administered 2017-09-09 – 2017-09-11 (×9): 10 mg via ORAL
  Filled 2017-09-09 (×4): qty 2
  Filled 2017-09-09: qty 1
  Filled 2017-09-09 (×7): qty 2

## 2017-09-09 MED ORDER — ALUM & MAG HYDROXIDE-SIMETH 200-200-20 MG/5ML PO SUSP: 30.0000 mL | ORAL | Status: DC | PRN

## 2017-09-09 MED ORDER — PHENYLEPHRINE HCL 10 MG/ML IJ SOLN
INTRAVENOUS | Status: DC | PRN
  Administered 2017-09-09: 50 ug/min via INTRAVENOUS

## 2017-09-09 MED ORDER — FENTANYL CITRATE (PF) 100 MCG/2ML IJ SOLN
INTRAMUSCULAR | Status: AC
  Filled 2017-09-09: qty 2

## 2017-09-09 MED ORDER — PROPOFOL 10 MG/ML IV BOLUS
INTRAVENOUS | Status: AC
  Filled 2017-09-09: qty 20

## 2017-09-09 MED ORDER — BISACODYL 5 MG PO TBEC: 5.0000 mg | DELAYED_RELEASE_TABLET | Freq: Every day | ORAL | Status: DC | PRN

## 2017-09-09 MED ORDER — BUPIVACAINE LIPOSOME 1.3 % IJ SUSP
20.0000 mL | Freq: Once | INTRAMUSCULAR | Status: DC
  Filled 2017-09-09: qty 20

## 2017-09-09 MED ORDER — CELECOXIB 200 MG PO CAPS
200.0000 mg | ORAL_CAPSULE | Freq: Two times a day (BID) | ORAL | Status: DC
  Administered 2017-09-09 – 2017-09-11 (×4): 200 mg via ORAL
  Filled 2017-09-09 (×4): qty 1

## 2017-09-09 MED ORDER — PROPOFOL 10 MG/ML IV BOLUS
INTRAVENOUS | Status: DC | PRN
  Administered 2017-09-09: 100 mg via INTRAVENOUS
  Administered 2017-09-09: 20 mg via INTRAVENOUS

## 2017-09-09 MED ORDER — PROPOFOL 10 MG/ML IV BOLUS
INTRAVENOUS | Status: AC
  Filled 2017-09-09: qty 60

## 2017-09-09 MED ORDER — TRANEXAMIC ACID 1000 MG/10ML IV SOLN
1000.0000 mg | Freq: Once | INTRAVENOUS | Status: AC
  Administered 2017-09-09: 1000 mg via INTRAVENOUS
  Filled 2017-09-09: qty 1100

## 2017-09-09 MED ORDER — ACETAMINOPHEN 325 MG PO TABS: 650.0000 mg | ORAL_TABLET | ORAL | Status: DC | PRN

## 2017-09-09 MED ORDER — METHOCARBAMOL 1000 MG/10ML IJ SOLN
500.0000 mg | Freq: Four times a day (QID) | INTRAVENOUS | Status: DC | PRN
  Administered 2017-09-09: 500 mg via INTRAVENOUS
  Filled 2017-09-09: qty 550

## 2017-09-09 MED ORDER — ASPIRIN EC 325 MG PO TBEC
325.0000 mg | DELAYED_RELEASE_TABLET | Freq: Two times a day (BID) | ORAL | Status: DC
  Administered 2017-09-09 – 2017-09-11 (×4): 325 mg via ORAL
  Filled 2017-09-09 (×4): qty 1

## 2017-09-09 MED ORDER — HYDROMORPHONE HCL 1 MG/ML IJ SOLN
0.5000 mg | INTRAMUSCULAR | Status: DC | PRN
  Administered 2017-09-09: 0.5 mg via INTRAVENOUS
  Administered 2017-09-09 – 2017-09-10 (×3): 1 mg via INTRAVENOUS
  Filled 2017-09-09 (×5): qty 1

## 2017-09-09 MED ORDER — ONDANSETRON HCL 4 MG/2ML IJ SOLN
INTRAMUSCULAR | Status: AC
  Filled 2017-09-09: qty 2

## 2017-09-09 MED ORDER — CEFAZOLIN SODIUM-DEXTROSE 2-4 GM/100ML-% IV SOLN
INTRAVENOUS | Status: AC
  Filled 2017-09-09: qty 100

## 2017-09-09 MED ORDER — BUPIVACAINE HCL (PF) 0.25 % IJ SOLN
INTRAMUSCULAR | Status: AC
  Filled 2017-09-09: qty 30

## 2017-09-09 MED ORDER — BUPIVACAINE IN DEXTROSE 0.75-8.25 % IT SOLN
INTRATHECAL | Status: DC | PRN
  Administered 2017-09-09: 2 mL via INTRATHECAL

## 2017-09-09 MED ORDER — HYDROMORPHONE HCL-NACL 0.5-0.9 MG/ML-% IV SOSY
PREFILLED_SYRINGE | INTRAVENOUS | Status: AC
  Filled 2017-09-09: qty 2

## 2017-09-09 MED ORDER — ONDANSETRON HCL 4 MG/2ML IJ SOLN
INTRAMUSCULAR | Status: DC | PRN
  Administered 2017-09-09: 4 mg via INTRAVENOUS

## 2017-09-09 MED ORDER — DOCUSATE SODIUM 100 MG PO CAPS: 100.0000 mg | ORAL_CAPSULE | Freq: Two times a day (BID) | ORAL | 0 refills | Status: DC

## 2017-09-09 MED ORDER — SODIUM CHLORIDE 0.9 % IV BOLUS (SEPSIS)
500.0000 mL | Freq: Once | INTRAVENOUS | Status: AC
  Administered 2017-09-09: 500 mL via INTRAVENOUS

## 2017-09-09 MED ORDER — PHENYLEPHRINE 40 MCG/ML (10ML) SYRINGE FOR IV PUSH (FOR BLOOD PRESSURE SUPPORT)
PREFILLED_SYRINGE | INTRAVENOUS | Status: DC | PRN
  Administered 2017-09-09 (×3): 80 ug via INTRAVENOUS

## 2017-09-09 MED ORDER — METHOCARBAMOL 500 MG PO TABS
500.0000 mg | ORAL_TABLET | Freq: Four times a day (QID) | ORAL | Status: DC | PRN
  Administered 2017-09-09 – 2017-09-10 (×3): 500 mg via ORAL
  Filled 2017-09-09 (×3): qty 1

## 2017-09-09 MED ORDER — FENTANYL CITRATE (PF) 100 MCG/2ML IJ SOLN
INTRAMUSCULAR | Status: DC | PRN
  Administered 2017-09-09: 100 ug via INTRAVENOUS

## 2017-09-09 MED ORDER — DIPHENHYDRAMINE HCL 12.5 MG/5ML PO ELIX: 12.5000 mg | ORAL_SOLUTION | ORAL | Status: DC | PRN

## 2017-09-09 MED ORDER — LACTATED RINGERS IV SOLN
INTRAVENOUS | Status: DC | PRN
  Administered 2017-09-09 (×2): via INTRAVENOUS

## 2017-09-09 MED ORDER — STERILE WATER FOR IRRIGATION IR SOLN
Status: DC | PRN
  Administered 2017-09-09: 2000 mL

## 2017-09-09 MED ORDER — DOCUSATE SODIUM 100 MG PO CAPS
100.0000 mg | ORAL_CAPSULE | Freq: Two times a day (BID) | ORAL | Status: DC
  Administered 2017-09-09 – 2017-09-10 (×3): 100 mg via ORAL
  Filled 2017-09-09 (×3): qty 1

## 2017-09-09 MED ORDER — CHLORHEXIDINE GLUCONATE 4 % EX LIQD: 60.0000 mL | Freq: Once | CUTANEOUS | Status: DC

## 2017-09-09 MED ORDER — TIZANIDINE HCL 2 MG PO TABS: 2.0000 mg | ORAL_TABLET | Freq: Three times a day (TID) | ORAL | 0 refills | Status: DC | PRN

## 2017-09-09 MED ORDER — PROPOFOL 500 MG/50ML IV EMUL
INTRAVENOUS | Status: DC | PRN
  Administered 2017-09-09: 125 ug/kg/min via INTRAVENOUS

## 2017-09-09 MED ORDER — CEFAZOLIN SODIUM-DEXTROSE 2-4 GM/100ML-% IV SOLN
2.0000 g | Freq: Four times a day (QID) | INTRAVENOUS | Status: AC
  Administered 2017-09-09 (×2): 2 g via INTRAVENOUS
  Filled 2017-09-09 (×2): qty 100

## 2017-09-09 MED ORDER — DEXAMETHASONE SODIUM PHOSPHATE 10 MG/ML IJ SOLN
INTRAMUSCULAR | Status: DC | PRN
  Administered 2017-09-09: 10 mg via INTRAVENOUS

## 2017-09-09 MED ORDER — ONDANSETRON HCL 4 MG/2ML IJ SOLN: 4.0000 mg | Freq: Four times a day (QID) | INTRAMUSCULAR | Status: DC | PRN

## 2017-09-09 MED ORDER — POLYETHYLENE GLYCOL 3350 17 G PO PACK: 17.0000 g | PACK | Freq: Every day | ORAL | Status: DC | PRN

## 2017-09-09 MED ORDER — CEFAZOLIN SODIUM-DEXTROSE 2-4 GM/100ML-% IV SOLN
2.0000 g | INTRAVENOUS | Status: AC
  Administered 2017-09-09: 2 g via INTRAVENOUS

## 2017-09-09 MED ORDER — MEPERIDINE HCL 50 MG/ML IJ SOLN: 6.2500 mg | INTRAMUSCULAR | Status: DC | PRN

## 2017-09-09 MED ORDER — SODIUM CHLORIDE 0.9 % IR SOLN
Status: DC | PRN
  Administered 2017-09-09: 1000 mL

## 2017-09-09 MED ORDER — BUPIVACAINE LIPOSOME 1.3 % IJ SUSP
INTRAMUSCULAR | Status: DC | PRN
  Administered 2017-09-09: 20 mL

## 2017-09-09 MED ORDER — ACETAMINOPHEN 650 MG RE SUPP: 650.0000 mg | RECTAL | Status: DC | PRN

## 2017-09-09 MED ORDER — PHENYLEPHRINE 40 MCG/ML (10ML) SYRINGE FOR IV PUSH (FOR BLOOD PRESSURE SUPPORT)
PREFILLED_SYRINGE | INTRAVENOUS | Status: AC
  Filled 2017-09-09: qty 10

## 2017-09-09 MED ORDER — MIDAZOLAM HCL 5 MG/5ML IJ SOLN
INTRAMUSCULAR | Status: DC | PRN
  Administered 2017-09-09: 2 mg via INTRAVENOUS

## 2017-09-09 MED ORDER — TRANEXAMIC ACID 1000 MG/10ML IV SOLN
1000.0000 mg | INTRAVENOUS | Status: AC
  Administered 2017-09-09: 1000 mg via INTRAVENOUS
  Filled 2017-09-09: qty 1100
  Filled 2017-09-09: qty 10

## 2017-09-09 MED ORDER — ASPIRIN EC 325 MG PO TBEC: 325.0000 mg | DELAYED_RELEASE_TABLET | Freq: Two times a day (BID) | ORAL | 0 refills | Status: DC

## 2017-09-09 MED ORDER — DEXAMETHASONE SODIUM PHOSPHATE 10 MG/ML IJ SOLN
INTRAMUSCULAR | Status: AC
  Filled 2017-09-09: qty 1

## 2017-09-09 MED ORDER — ALPRAZOLAM 1 MG PO TABS
1.0000 mg | ORAL_TABLET | Freq: Every day | ORAL | Status: DC
  Administered 2017-09-09 – 2017-09-10 (×2): 1 mg via ORAL
  Filled 2017-09-09 (×2): qty 1

## 2017-09-09 MED ORDER — HYDROMORPHONE HCL-NACL 0.5-0.9 MG/ML-% IV SOSY
0.2500 mg | PREFILLED_SYRINGE | INTRAVENOUS | Status: DC | PRN
  Administered 2017-09-09 (×4): 0.5 mg via INTRAVENOUS

## 2017-09-09 MED ORDER — PROMETHAZINE HCL 25 MG/ML IJ SOLN: 6.2500 mg | INTRAMUSCULAR | Status: DC | PRN

## 2017-09-09 MED ORDER — MIDAZOLAM HCL 2 MG/2ML IJ SOLN
INTRAMUSCULAR | Status: AC
  Filled 2017-09-09: qty 2

## 2017-09-09 MED ORDER — DEXAMETHASONE SODIUM PHOSPHATE 10 MG/ML IJ SOLN
10.0000 mg | Freq: Two times a day (BID) | INTRAMUSCULAR | Status: AC
  Administered 2017-09-10 (×2): 10 mg via INTRAVENOUS
  Filled 2017-09-09 (×3): qty 1

## 2017-09-09 MED ORDER — BUPIVACAINE HCL (PF) 0.25 % IJ SOLN
INTRAMUSCULAR | Status: DC | PRN
Start: 2017-09-09 — End: 2017-09-09
  Administered 2017-09-09: 30 mL

## 2017-09-09 MED ORDER — ONDANSETRON HCL 4 MG PO TABS: 4.0000 mg | ORAL_TABLET | Freq: Four times a day (QID) | ORAL | Status: DC | PRN

## 2017-09-09 MED ORDER — PHENYLEPHRINE HCL 10 MG/ML IJ SOLN
INTRAMUSCULAR | Status: AC
  Filled 2017-09-09: qty 1

## 2017-09-09 MED ORDER — OXYCODONE-ACETAMINOPHEN 5-325 MG PO TABS: 1.0000 | ORAL_TABLET | Freq: Four times a day (QID) | ORAL | 0 refills | Status: DC | PRN

## 2017-09-09 MED ORDER — GABAPENTIN 300 MG PO CAPS
300.0000 mg | ORAL_CAPSULE | Freq: Two times a day (BID) | ORAL | Status: DC
  Administered 2017-09-09 – 2017-09-11 (×5): 300 mg via ORAL
  Filled 2017-09-09 (×5): qty 1

## 2017-09-09 SURGICAL SUPPLY — 40 items
APL SKNCLS STERI-STRIP NONHPOA (GAUZE/BANDAGES/DRESSINGS) ×1
BAG SPEC THK2 15X12 ZIP CLS (MISCELLANEOUS)
BAG ZIPLOCK 12X15 (MISCELLANEOUS) IMPLANT
BENZOIN TINCTURE PRP APPL 2/3 (GAUZE/BANDAGES/DRESSINGS) ×1 IMPLANT
BLADE SAW SGTL 18X1.27X75 (BLADE) ×2 IMPLANT
CAPT HIP TOTAL 2 ×2 IMPLANT
CELLS DAT CNTRL 66122 CELL SVR (MISCELLANEOUS) ×1 IMPLANT
COVER PERINEAL POST (MISCELLANEOUS) ×2 IMPLANT
COVER SURGICAL LIGHT HANDLE (MISCELLANEOUS) ×2 IMPLANT
DRAPE STERI IOBAN 125X83 (DRAPES) ×2 IMPLANT
DRAPE U-SHAPE 47X51 STRL (DRAPES) ×4 IMPLANT
DRSG AQUACEL AG ADV 3.5X 6 (GAUZE/BANDAGES/DRESSINGS) ×2 IMPLANT
DURAPREP 26ML APPLICATOR (WOUND CARE) ×2 IMPLANT
ELECT REM PT RETURN 15FT ADLT (MISCELLANEOUS) ×2 IMPLANT
GLOVE BIO SURGEON STRL SZ 6.5 (GLOVE) ×1 IMPLANT
GLOVE BIOGEL PI IND STRL 6 (GLOVE) ×1 IMPLANT
GLOVE BIOGEL PI IND STRL 7.0 (GLOVE) ×2 IMPLANT
GLOVE BIOGEL PI IND STRL 7.5 (GLOVE) ×2 IMPLANT
GLOVE BIOGEL PI IND STRL 8 (GLOVE) ×2 IMPLANT
GLOVE BIOGEL PI INDICATOR 6 (GLOVE) ×1
GLOVE BIOGEL PI INDICATOR 7.0 (GLOVE) ×2
GLOVE BIOGEL PI INDICATOR 7.5 (GLOVE) ×2
GLOVE BIOGEL PI INDICATOR 8 (GLOVE) ×2
GLOVE ECLIPSE 7.5 STRL STRAW (GLOVE) ×4 IMPLANT
GOWN STRL REUS W/TWL LRG LVL3 (GOWN DISPOSABLE) ×1 IMPLANT
GOWN STRL REUS W/TWL XL LVL3 (GOWN DISPOSABLE) ×5 IMPLANT
HOLDER FOLEY CATH W/STRAP (MISCELLANEOUS) ×2 IMPLANT
HOOD PEEL AWAY FLYTE STAYCOOL (MISCELLANEOUS) ×4 IMPLANT
PACK ANTERIOR HIP CUSTOM (KITS) ×2 IMPLANT
RETRACTOR WND ALEXIS 18 MED (MISCELLANEOUS) ×1 IMPLANT
RTRCTR WOUND ALEXIS 18CM MED (MISCELLANEOUS) ×2
STRIP CLOSURE SKIN 1/2X4 (GAUZE/BANDAGES/DRESSINGS) ×2 IMPLANT
SUT ETHIBOND NAB CT1 #1 30IN (SUTURE) ×4 IMPLANT
SUT MNCRL AB 3-0 PS2 18 (SUTURE) ×1 IMPLANT
SUT VIC AB 0 CT1 36 (SUTURE) ×2 IMPLANT
SUT VIC AB 1 CT1 36 (SUTURE) ×4 IMPLANT
SUT VIC AB 2-0 CT1 27 (SUTURE) ×4
SUT VIC AB 2-0 CT1 TAPERPNT 27 (SUTURE) ×2 IMPLANT
TRAY FOLEY W/METER SILVER 16FR (SET/KITS/TRAYS/PACK) ×2 IMPLANT
YANKAUER SUCT BULB TIP NO VENT (SUCTIONS) ×2 IMPLANT

## 2017-09-09 NOTE — Anesthesia Postprocedure Evaluation (Signed)
Anesthesia Post Note  Patient: Randy Walker  Procedure(s) Performed: LEFT TOTAL HIP ARTHROPLASTY ANTERIOR APPROACH (Left Hip)     Patient location during evaluation: PACU Anesthesia Type: Spinal Level of consciousness: oriented and awake and alert Pain management: pain level controlled Vital Signs Assessment: post-procedure vital signs reviewed and stable Respiratory status: spontaneous breathing, respiratory function stable and nonlabored ventilation Cardiovascular status: blood pressure returned to baseline and stable Postop Assessment: no headache, no backache, no apparent nausea or vomiting, spinal receding and patient able to bend at knees Anesthetic complications: no    Last Vitals:  Vitals:   09/09/17 1030 09/09/17 1045  BP: (!) 136/98 (!) 111/94  Pulse: 69 72  Resp: 16 19  Temp:    SpO2: 99% 100%    Last Pain:  Vitals:   09/09/17 1027  TempSrc:   PainSc: 3                  Javone Ybanez A.

## 2017-09-09 NOTE — Anesthesia Procedure Notes (Signed)
Spinal  Patient location during procedure: OR Start time: 09/09/2017 7:24 AM End time: 09/09/2017 7:28 AM Staffing Resident/CRNA: Danley Danker L Performed: resident/CRNA  Preanesthetic Checklist Completed: patient identified, site marked, surgical consent, pre-op evaluation, timeout performed, IV checked, risks and benefits discussed and monitors and equipment checked Spinal Block Patient position: sitting Prep: Betadine Patient monitoring: heart rate, continuous pulse ox and blood pressure Approach: midline Location: L3-4 Injection technique: single-shot Needle Needle type: Sprotte  Needle gauge: 24 G Needle length: 9 cm Additional Notes Kit expiration date 12/23/2019 and lot # [id number] Clear CSF, negative heme, negative paresthesia Tolerated well and returned to supine position

## 2017-09-09 NOTE — Discharge Instructions (Signed)

## 2017-09-09 NOTE — Transfer of Care (Signed)
Immediate Anesthesia Transfer of Care Note  Patient: Randy Walker  Procedure(s) Performed: LEFT TOTAL HIP ARTHROPLASTY ANTERIOR APPROACH (Left Hip)  Patient Location: PACU  Anesthesia Type:Spinal  Level of Consciousness: awake, alert  and oriented  Airway & Oxygen Therapy: Patient Spontanous Breathing and Patient connected to face mask oxygen  Post-op Assessment: Report given to RN and Post -op Vital signs reviewed and stable  Post vital signs: Reviewed and stable  Last Vitals:  Vitals:   09/09/17 0624 09/09/17 0945  BP: (!) 149/102 122/84  Pulse: 68 71  Resp: 18   Temp: 36.9 C   SpO2: 99% 99%    Last Pain:  Vitals:   09/09/17 0624  TempSrc: Oral  PainSc:       Patients Stated Pain Goal: 4 (16/24/46 9507)  Complications: No apparent anesthesia complications

## 2017-09-09 NOTE — Anesthesia Procedure Notes (Signed)
Date/Time: 09/09/2017 7:24 AM Performed by: Danley Danker L Oxygen Delivery Method: Simple face mask

## 2017-09-09 NOTE — Evaluation (Signed)
Physical Therapy Evaluation Patient Details Name: Randy Walker MRN: [id number] DOB: [birth year] Today's Date: 09/09/2017   History of Present Illness  Pt s/p L THR  Clinical Impression  Pt s/p L THR and presents with decreased L LE strength and post op pain limiting functional mobility.  Pt should progress to dc home with family assist.  On eval, pt with c/o dizziness with ambulation - returned to chair with BP 87/62 - RN aware and assessing pt.    Follow Up Recommendations Home health PT;DC plan and follow up therapy as arranged by surgeon    Equipment Recommendations  None recommended by PT    Recommendations for Other Services OT consult     Precautions / Restrictions Precautions Precautions: Fall Restrictions Weight Bearing Restrictions: No Other Position/Activity Restrictions: WBAT      Mobility  Bed Mobility Overal bed mobility: Needs Assistance Bed Mobility: Supine to Sit     Supine to sit: Min assist;Mod assist     General bed mobility comments: cues for sequence and use of R LE to self assist  Transfers Overall transfer level: Needs assistance Equipment used: Rolling walker (2 wheeled) Transfers: Sit to/from Stand Sit to Stand: Min assist         General transfer comment: cues for LE management and use of UEs to self assist  Ambulation/Gait Ambulation/Gait assistance: Min assist;+2 physical assistance;+2 safety/equipment Ambulation Distance (Feet): 30 Feet Assistive device: Rolling walker (2 wheeled) Gait Pattern/deviations: Step-to pattern;Decreased step length - right;Decreased step length - left;Shuffle;Trunk flexed Gait velocity: decr Gait velocity interpretation: Below normal speed for age/gender General Gait Details: cues for sequence, posture and position from RW - distance ltd by onset dizziness - BP 87/62 - RN aware  Stairs            Wheelchair Mobility    Modified Rankin (Stroke Patients Only)       Balance Overall  balance assessment: No apparent balance deficits (not formally assessed)                                           Pertinent Vitals/Pain Pain Assessment: 0-10 Pain Score: 4  Pain Location: L hip Pain Descriptors / Indicators: Aching;Burning;Sore Pain Intervention(s): Limited activity within patient's tolerance;Monitored during session;Premedicated before session;Ice applied    Home Living Family/patient expects to be discharged to:: Private residence Living Arrangements: Spouse/significant other Available Help at Discharge: Family Type of Home: House Home Access: Stairs to enter Entrance Stairs-Rails: None Technical brewer of Steps: 2 Home Layout: One level Home Equipment: Environmental consultant - 2 wheels;Crutches;Cane - single point      Prior Function Level of Independence: Independent               Hand Dominance        Extremity/Trunk Assessment   Upper Extremity Assessment Upper Extremity Assessment: Overall WFL for tasks assessed    Lower Extremity Assessment Lower Extremity Assessment: LLE deficits/detail    Cervical / Trunk Assessment Cervical / Trunk Assessment: Normal  Communication   Communication: No difficulties  Cognition Arousal/Alertness: Awake/alert Behavior During Therapy: WFL for tasks assessed/performed Overall Cognitive Status: Within Functional Limits for tasks assessed                                        General  Comments      Exercises Total Joint Exercises Ankle Circles/Pumps: AROM;Both;15 reps;Supine   Assessment/Plan    PT Assessment Patient needs continued PT services  PT Problem List Decreased strength;Decreased range of motion;Decreased activity tolerance;Decreased mobility;Decreased knowledge of use of DME;Pain       PT Treatment Interventions DME instruction;Gait training;Stair training;Therapeutic activities;Functional mobility training;Therapeutic exercise;Patient/family education     PT Goals (Current goals can be found in the Care Plan section)  Acute Rehab PT Goals Patient Stated Goal: Regain IND  PT Goal Formulation: With patient Time For Goal Achievement: 09/14/17 Potential to Achieve Goals: Good    Frequency 7X/week   Barriers to discharge        Co-evaluation               AM-PAC PT "6 Clicks" Daily Activity  Outcome Measure Difficulty turning over in bed (including adjusting bedclothes, sheets and blankets)?: Unable Difficulty moving from lying on back to sitting on the side of the bed? : Unable Difficulty sitting down on and standing up from a chair with arms (e.g., wheelchair, bedside commode, etc,.)?: Unable Help needed moving to and from a bed to chair (including a wheelchair)?: A Little Help needed walking in hospital room?: A Little Help needed climbing 3-5 steps with a railing? : A Little 6 Click Score: 12    End of Session Equipment Utilized During Treatment: Gait belt Activity Tolerance: Patient tolerated treatment well;Other (comment) (orthostatic) Patient left: in chair;with call bell/phone within reach;with family/visitor present;with nursing/sitter in room Nurse Communication: Mobility status PT Visit Diagnosis: Difficulty in walking, not elsewhere classified (R26.2)    Time: 9983-3825 PT Time Calculation (min) (ACUTE ONLY): 30 min   Charges:   PT Evaluation $PT Eval Low Complexity: 1 Low PT Treatments $Gait Training: 8-22 mins   PT G Codes:        Pg [phone]   Caisley Baxendale 09/09/2017, 6:02 PM

## 2017-09-09 NOTE — Brief Op Note (Signed)
09/09/2017  9:43 AM  PATIENT:  Randy Walker  61 y.o. male  PRE-OPERATIVE DIAGNOSIS:  OSTEOARTHRTIS LEFT HIP  POST-OPERATIVE DIAGNOSIS:  OSTEOARTHRTIS LEFT HIP  PROCEDURE:  Procedure(s): LEFT TOTAL HIP ARTHROPLASTY ANTERIOR APPROACH (Left)  SURGEON:  Surgeon(s) and Role:    Dorna Leitz, MD - Primary  PHYSICIAN ASSISTANT:   ASSISTANTS: bethune   ANESTHESIA:   spinal  EBL:  450 mL   BLOOD ADMINISTERED:none  DRAINS: none   LOCAL MEDICATIONS USED:  MARCAINE    and OTHER experel  SPECIMEN:  No Specimen  DISPOSITION OF SPECIMEN:  N/A  COUNTS:  YES  TOURNIQUET:  * No tourniquets in log *  DICTATION: .Other Dictation: Dictation Number [id number]  PLAN OF CARE: Admit to inpatient   PATIENT DISPOSITION:  PACU - hemodynamically stable.   Delay start of Pharmacological VTE agent (>24hrs) due to surgical blood loss or risk of bleeding: no

## 2017-09-10 ENCOUNTER — Inpatient Hospital Stay (HOSPITAL_COMMUNITY): Payer: 59

## 2017-09-10 LAB — CBC
HCT: 29.5 % — ABNORMAL LOW (ref 39.0–52.0)
Hemoglobin: 10.3 g/dL — ABNORMAL LOW (ref 13.0–17.0)
MCH: 33.7 pg (ref 26.0–34.0)
MCHC: 34.9 g/dL (ref 30.0–36.0)
MCV: 96.4 fL (ref 78.0–100.0)
Platelets: 171 10*3/uL (ref 150–400)
RBC: 3.06 MIL/uL — ABNORMAL LOW (ref 4.22–5.81)
RDW: 13 % (ref 11.5–15.5)
WBC: 11.7 10*3/uL — ABNORMAL HIGH (ref 4.0–10.5)

## 2017-09-10 LAB — BASIC METABOLIC PANEL
Anion gap: 7 (ref 5–15)
BUN: 11 mg/dL (ref 6–20)
CO2: 27 mmol/L (ref 22–32)
Calcium: 8.4 mg/dL — ABNORMAL LOW (ref 8.9–10.3)
Chloride: 104 mmol/L (ref 101–111)
Creatinine, Ser: 0.8 mg/dL (ref 0.61–1.24)
GFR calc Af Amer: >60 mL/min (ref >60)
GFR calc non Af Amer: >60 mL/min (ref >60)
Glucose, Bld: 166 mg/dL — ABNORMAL HIGH (ref 65–99)
Potassium: 4.7 mmol/L (ref 3.5–5.1)
Sodium: 138 mmol/L (ref 135–145)

## 2017-09-10 NOTE — Progress Notes (Signed)
Physical Therapy Treatment Patient Details Name: Randy Walker MRN: [id number] DOB: [birth year] Today's Date: 09/10/2017    History of Present Illness Pt s/p L THR    PT Comments    Pt mobilizing well this pm with no c/o dizziness.   Follow Up Recommendations  Home health PT;DC plan and follow up therapy as arranged by surgeon     Equipment Recommendations  None recommended by PT    Recommendations for Other Services OT consult     Precautions / Restrictions Precautions Precautions: Fall Restrictions Weight Bearing Restrictions: No Other Position/Activity Restrictions: WBAT    Mobility  Bed Mobility Overal bed mobility: Needs Assistance Bed Mobility: Supine to Sit     Supine to sit: Min assist     General bed mobility comments: cues for sequence and use of R LE to self assist  Transfers Overall transfer level: Needs assistance Equipment used: Rolling walker (2 wheeled) Transfers: Sit to/from Stand Sit to Stand: Min assist         General transfer comment: cues for LE management and use of UEs to self assist  Ambulation/Gait Ambulation/Gait assistance: Min assist;+2 safety/equipment Ambulation Distance (Feet): 150 Feet Assistive device: Rolling walker (2 wheeled) Gait Pattern/deviations: Step-to pattern;Decreased step length - right;Decreased step length - left;Shuffle;Trunk flexed Gait velocity: decr Gait velocity interpretation: Below normal speed for age/gender General Gait Details: cues for sequence, posture and position from RW - distance ltd by onset dizziness - BP 87/62 - RN aware   Stairs            Wheelchair Mobility    Modified Rankin (Stroke Patients Only)       Balance Overall balance assessment: No apparent balance deficits (not formally assessed)                                          Cognition Arousal/Alertness: Awake/alert Behavior During Therapy: WFL for tasks assessed/performed Overall Cognitive  Status: Within Functional Limits for tasks assessed                                        Exercises Total Joint Exercises Ankle Circles/Pumps: AROM;Both;15 reps;Supine Quad Sets: AROM;Both;10 reps;Supine Heel Slides: AAROM;20 reps;Supine;Left Hip ABduction/ADduction: AAROM;Left;15 reps;Supine    General Comments        Pertinent Vitals/Pain Pain Assessment: 0-10 Pain Score: 4  Pain Location: L hip Pain Descriptors / Indicators: Aching;Burning;Sore Pain Intervention(s): Limited activity within patient's tolerance;Monitored during session;Premedicated before session;Ice applied    Home Living                      Prior Function            PT Goals (current goals can now be found in the care plan section) Acute Rehab PT Goals Patient Stated Goal: Regain IND  PT Goal Formulation: With patient Time For Goal Achievement: 09/14/17 Potential to Achieve Goals: Good Progress towards PT goals: Progressing toward goals    Frequency    7X/week      PT Plan Current plan remains appropriate    Co-evaluation              AM-PAC PT "6 Clicks" Daily Activity  Outcome Measure  Difficulty turning over in bed (including adjusting bedclothes, sheets and blankets)?: Unable  Difficulty moving from lying on back to sitting on the side of the bed? : Unable Difficulty sitting down on and standing up from a chair with arms (e.g., wheelchair, bedside commode, etc,.)?: Unable Help needed moving to and from a bed to chair (including a wheelchair)?: A Little Help needed walking in hospital room?: A Little Help needed climbing 3-5 steps with a railing? : A Little 6 Click Score: 12    End of Session Equipment Utilized During Treatment: Gait belt Activity Tolerance: Patient tolerated treatment well Patient left: in chair;with call bell/phone within reach;with family/visitor present Nurse Communication: Mobility status PT Visit Diagnosis: Difficulty in  walking, not elsewhere classified (R26.2)     Time: 4696-2952 PT Time Calculation (min) (ACUTE ONLY): 26 min  Charges:  $Gait Training: 8-22 mins $Therapeutic Exercise: 8-22 mins                    G Codes:       Pg [phone]    Annaliz Aven 09/10/2017, 4:38 PM

## 2017-09-10 NOTE — Progress Notes (Signed)
PT Cancellation Note  Patient Details Name: Randy Walker MRN: [id number] DOB: 06/28/1956   Cancelled Treatment:     PT deferred this am - Dr Berenice Primas requesting X-ray before activity.  Will follow.   Jered Heiny 09/10/2017, 1:22 PM

## 2017-09-10 NOTE — Progress Notes (Addendum)
Subjective: 1 Day Post-Op Procedure(s) (LRB): LEFT TOTAL HIP ARTHROPLASTY ANTERIOR APPROACH (Left) Patient reports pain as moderate.    Objective: Vital signs in last 24 hours: Temp:  [97.5 F (36.4 C)-98.6 F (37 C)] 97.6 F (36.4 C) (10/20 0600) Pulse Rate:  [59-114] 60 (10/20 0600) Resp:  [8-19] 16 (10/20 0600) BP: (91-148)/(62-108) 111/76 (10/20 0600) SpO2:  [93 %-100 %] 96 % (10/20 0600)  Intake/Output from previous day: 10/19 0701 - 10/20 0700 In: 5213.3 [P.O.:1320; I.V.:3683.3; IV Piggyback:210] Out: 3025 [Urine:2575; Blood:450] Intake/Output this shift: Total I/O In: 360 [P.O.:360] Out: -    Recent Labs  09/10/17 0607  HGB 10.3*    Recent Labs  09/10/17 0607  WBC 11.7*  RBC 3.06*  HCT 29.5*  PLT 171    Recent Labs  09/10/17 0607  NA 138  K 4.7  CL 104  CO2 27  BUN 11  CREATININE 0.80  GLUCOSE 166*  CALCIUM 8.4*   No results for input(s): LABPT, INR in the last 72 hours.  Neurologically intact ABD soft Neurovascular intact Dorsiflexion/Plantar flexion intact Compartment soft Leg significantly swollen over the anterior aspect.  Minimal pain with logrolling. Assessment/Plan: 1 Day Post-Op Procedure(s) (LRB): LEFT TOTAL HIP ARTHROPLASTY ANTERIOR APPROACH (Left) Advance diet Up with therapy Plan for discharge tomorrow  Acute blood loss anemia status post anterior total hip with 4 unit drop in hemoglobin as opposed to an anticipated 2 unit drop in hemoglobin. Given the amount of swelling and drop in hemoglobin I want to get a stat x-ray of the hip to make sure there is nothing significant going on there. Jailen Lung L 09/10/2017, 8:35 AM

## 2017-09-10 NOTE — Progress Notes (Signed)
Physical Therapy Treatment Patient Details Name: Randy Walker MRN: [id number] DOB: 08-Nov-1956 Today's Date: 09/10/2017    History of Present Illness Pt s/p L THR    PT Comments    Pt progressing well with mobility and hopeful for dc home tomorrow.   Follow Up Recommendations  Home health PT;DC plan and follow up therapy as arranged by surgeon     Equipment Recommendations  None recommended by PT    Recommendations for Other Services OT consult     Precautions / Restrictions Precautions Precautions: Fall Restrictions Weight Bearing Restrictions: No Other Position/Activity Restrictions: WBAT    Mobility  Bed Mobility Overal bed mobility: Needs Assistance Bed Mobility: Sit to Supine     Supine to sit: Min assist Sit to supine: Min guard   General bed mobility comments: cues for sequence and use of R LE to self assist  Transfers Overall transfer level: Needs assistance Equipment used: Rolling walker (2 wheeled) Transfers: Sit to/from Stand Sit to Stand: Min guard         General transfer comment: cues for LE management and use of UEs to self assist  Ambulation/Gait Ambulation/Gait assistance: Min assist;Min guard Ambulation Distance (Feet): 200 Feet Assistive device: Rolling walker (2 wheeled) Gait Pattern/deviations: Step-to pattern;Decreased step length - right;Decreased step length - left;Shuffle;Trunk flexed Gait velocity: decr Gait velocity interpretation: Below normal speed for age/gender General Gait Details: min cues for sequence, posture and position from RW - distance ltd by onset dizziness - BP 87/62 - RN aware   Stairs            Wheelchair Mobility    Modified Rankin (Stroke Patients Only)       Balance Overall balance assessment: No apparent balance deficits (not formally assessed)                                          Cognition Arousal/Alertness: Awake/alert Behavior During Therapy: WFL for tasks  assessed/performed Overall Cognitive Status: Within Functional Limits for tasks assessed                                        Exercises Total Joint Exercises Ankle Circles/Pumps: AROM;Both;15 reps;Supine Quad Sets: AROM;Both;10 reps;Supine Heel Slides: AAROM;20 reps;Supine;Left Hip ABduction/ADduction: AAROM;Left;15 reps;Supine    General Comments        Pertinent Vitals/Pain Pain Assessment: 0-10 Pain Score: 6  Pain Location: L hip Pain Descriptors / Indicators: Aching;Burning;Sore Pain Intervention(s): Limited activity within patient's tolerance;Monitored during session;Patient requesting pain meds-RN notified;Premedicated before session;Ice applied    Home Living                      Prior Function            PT Goals (current goals can now be found in the care plan section) Acute Rehab PT Goals Patient Stated Goal: Regain IND  PT Goal Formulation: With patient Time For Goal Achievement: 09/14/17 Potential to Achieve Goals: Good Progress towards PT goals: Progressing toward goals    Frequency    7X/week      PT Plan Current plan remains appropriate    Co-evaluation              AM-PAC PT "6 Clicks" Daily Activity  Outcome Measure  Difficulty turning  over in bed (including adjusting bedclothes, sheets and blankets)?: A Lot Difficulty moving from lying on back to sitting on the side of the bed? : A Lot Difficulty sitting down on and standing up from a chair with arms (e.g., wheelchair, bedside commode, etc,.)?: A Lot Help needed moving to and from a bed to chair (including a wheelchair)?: A Little Help needed walking in hospital room?: A Little Help needed climbing 3-5 steps with a railing? : A Little 6 Click Score: 15    End of Session Equipment Utilized During Treatment: Gait belt Activity Tolerance: Patient tolerated treatment well Patient left: in bed;with call bell/phone within reach;with family/visitor  present Nurse Communication: Mobility status PT Visit Diagnosis: Difficulty in walking, not elsewhere classified (R26.2)     Time: 8309-4076 PT Time Calculation (min) (ACUTE ONLY): 24 min  Charges:  $Gait Training: 23-37 mins $Therapeutic Exercise: 8-22 mins                    G Codes:       Pg [phone]    Laine Fonner 09/10/2017, 5:37 PM

## 2017-09-10 NOTE — Op Note (Signed)
Randy Walker, Randy Walker NO.:  [id number]  MEDICAL RECORD NO.:  [id number]  LOCATION:  [facility]                         FACILITY:  [facility]  PHYSICIAN:  Alta Corning, M.D.   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  PROCEDURES: 1. Left total hip replacement with a Corail stem size 14 high offset,     56 mm pinnacle cup, +4 neutral liner, and 36 mm hip ball. 2. Interpretation of multiple intraoperative fluoroscopic images.  SURGEON:  Alta Corning, M.D.  ASSISTANT:  Gary Fleet, P.A.  ANESTHESIA:  Spinal.  BRIEF HISTORY:  Randy Walker is a 60 year old male with a long history and significant complaints of left hip pain.  He had been treated conservatively for prolonged period of time.  After failure of all conservative care, he was taken to the operating room for left total hip replacement.  He was having night pain and light activity pain and x-ray showed bone-on-bone change, severe.  DESCRIPTION OF PROCEDURE:  The patient was taken to the operating room. After adequate anesthesia was obtained with a spinal anesthetic, the patient was placed supine on the operating table and then placed onto the Hana bed and the left hip was prepped and draped in usual sterile fashion.  Following this, imaging was undertaken that showed the hip disease and preoperative assessment of his situation.  Following this, an incision was made for an anterior approach to the hip, subcutaneous tissues down to the level of the tensor fascia.  Tensor fascia was divided in line with its fibers and the muscle was finger fractured to allow access to the front part of the hip.  Retractors were put in place and  the capsule was then opened after the vessels to the anterior aspect of the hip were cauterized.  Following this, the capsule was opened and tagged.  Retractors were replaced and a provisional neck cut was made. Following this, the head ball was removed and sized on the back table, noted to be 53.  We then sequentially put retractors in place anterior and posterior to the hip and then sequentially reamed the hip up to a level of 55 mm and a 56-mm Pinnacle cup was used with 30 degrees of anteversion, 45 degrees of lateral opening.  Once this was completed, attention was turned towards placement of a +4 neutral liner.  Attention was then turned to the stem size.  The stem was then sequentially rasped up to a size 14, put a +0 hip ball with standard offset.  This clearly was not enough offset and it was too short.  We put the offset on with a high offset.  The length was actually good, but the offset was not sufficient, so we went with a high offset and then ultimately up to a size 12 ball, which gave [place] perfect leg length and offset and it was symmetric to his preoperative images.  At this point, we  did stability testing which was excellent.  The trial offsets were removed.  The final 14 KLA high offset stem Corail carotid was opened and placed.  Re- trialed a 12 just to see stability and that everything was great, it was, and at this point, we placed a 12 ball and then went with the trial reduction stability testing, it was excellent at this point.  At this point, the capsule was closed with 1 Vicryl running.  The tensor fascia closed with 0 Vicryl, the skin with 0 and 2-0 Vicryl, and 3-0 Monocryl subcuticular.  Benzoin and Steri-Strips were applied.  Sterile compressive dressing was applied.  The patient was taken to the recovery room and was noted to be in satisfactory condition.  Estimated blood loss for the procedure was approximately 350 mL with the final blood loss can be gotten from  the anesthetic record.  Of note, the fluoro was used throughout the case to assess the leg length and offset.     Alta Corning, M.D.     Corliss Skains  D:  09/09/2017  T:  09/10/2017  Job:  [id number]  cc:   Alta Corning, M.D. Fax: [phone]

## 2017-09-11 LAB — CBC
HCT: 25.8 % — ABNORMAL LOW (ref 39.0–52.0)
Hemoglobin: 9.2 g/dL — ABNORMAL LOW (ref 13.0–17.0)
MCH: 34.5 pg — ABNORMAL HIGH (ref 26.0–34.0)
MCHC: 35.7 g/dL (ref 30.0–36.0)
MCV: 96.6 fL (ref 78.0–100.0)
Platelets: 157 10*3/uL (ref 150–400)
RBC: 2.67 MIL/uL — ABNORMAL LOW (ref 4.22–5.81)
RDW: 13.2 % (ref 11.5–15.5)
WBC: 11.4 10*3/uL — ABNORMAL HIGH (ref 4.0–10.5)

## 2017-09-11 NOTE — Progress Notes (Signed)
DC instructions and prescriptions given and provided in detail to patient. Patient verbalized understanding; spouse too at bedside. Patient escorted to lobby for discharge in wheelchair via nursing tech

## 2017-09-11 NOTE — Progress Notes (Signed)
Subjective: 2 Days Post-Op Procedure(s) (LRB): LEFT TOTAL HIP ARTHROPLASTY ANTERIOR APPROACH (Left) Patient reports pain as mild.  Up with physical therapy ambulating in the hall. Taking by mouth and voiding okay. Positive flatus. No BM yet.  Objective: Vital signs in last 24 hours: Temp:  [97.6 F (36.4 C)-98.6 F (37 C)] 97.6 F (36.4 C) (10/21 0430) Pulse Rate:  [68-88] 68 (10/20 2236) Resp:  [17-18] 17 (10/21 0430) BP: (123-137)/(73-93) 128/93 (10/21 0430) SpO2:  [96 %-98 %] 98 % (10/21 0430)  Intake/Output from previous day: 10/20 0701 - 10/21 0700 In: 1920 [P.O.:1920] Out: 2950 [Urine:2950] Intake/Output this shift: Total I/O In: 360 [P.O.:360] Out: -    Recent Labs  09/10/17 0607 09/11/17 0527  HGB 10.3* 9.2*    Recent Labs  09/10/17 0607 09/11/17 0527  WBC 11.7* 11.4*  RBC 3.06* 2.67*  HCT 29.5* 25.8*  PLT 171 157    Recent Labs  09/10/17 0607  NA 138  K 4.7  CL 104  CO2 27  BUN 11  CREATININE 0.80  GLUCOSE 166*  CALCIUM 8.4*   No results for input(s): LABPT, INR in the last 72 hours. Left hip exam: Has moderate left thigh swelling. Calf is soft. No sign of compartment syndrome. NV intact to left lower extremity. Left hip dressing is benign. Sensation intact distally Intact pulses distally Dorsiflexion/Plantar flexion intact  Assessment/Plan: 2 Days Post-Op Procedure(s) (LRB): LEFT TOTAL HIP ARTHROPLASTY ANTERIOR APPROACH (Left) Plan: Weight-bear as tolerated on left with no hip precautions. Aspirin 325 mg twice daily for DVT prophylaxis 1 month postop. Given Rx for Percocet/tizanidine/aspirin/Colace. Discharge home with home health Follow-up with Dr. Berenice Primas in 2 weeks.  Zylen Wenig G 09/11/2017, 9:30 AM

## 2017-09-11 NOTE — Discharge Summary (Signed)
Patient ID: Randy Walker MRN: [id number] DOB/AGE: [birth year] 59 y.o.  Admit date: 09/09/2017 Discharge date: 09/11/2017  Admission Diagnoses:  Principal Problem:   Primary osteoarthritis of left hip   Discharge Diagnoses:  Same  Past Medical History:  Diagnosis Date  . Adenomatous polyps 11/17/2011  . Alcohol use 11/17/2011  . ALLERGIC RHINITIS 09/10/2007  . ANXIETY 09/10/2007  . BACK PAIN 12/31/2010  . [place] DISEASE, LUMBAR 12/31/2010  . GERD (gastroesophageal reflux disease) 11/23/2016  . Hyperglycemia 11/23/2016  . HYPERLIPIDEMIA 12/31/2010  . Hypertension   . Personal history of rectal adenomas 01/29/2008  . PSORIASIS 12/31/2010  . PULMONARY EMBOLISM, HX OF 09/10/2007  . RASH-NONVESICULAR 04/23/2009  . SMOKER 12/31/2010    Surgeries: Procedure(s): LEFT TOTAL HIP ARTHROPLASTY ANTERIOR APPROACH on 09/09/2017   Consultants:   Discharged Condition: Improved  Hospital Course: NIRAJ KUDRNA is an 61 y.o. male who was admitted 09/09/2017 for operative treatment ofPrimary osteoarthritis of left hip. Patient has severe unremitting pain that affects sleep, daily activities, and work/hobbies. After pre-op clearance the patient was taken to the operating room on 09/09/2017 and underwent  Procedure(s): LEFT TOTAL HIP ARTHROPLASTY ANTERIOR APPROACH.    Patient was given perioperative antibiotics: Anti-infectives    Start     Dose/Rate Route Frequency Ordered Stop   09/09/17 1400  ceFAZolin (ANCEF) IVPB 2g/100 mL premix     2 g 200 mL/hr over 30 Minutes Intravenous Every 6 hours 09/09/17 1116 09/09/17 2049   09/09/17 0635  ceFAZolin (ANCEF) 2-4 GM/100ML-% IVPB    Comments:  Danley Danker   : cabinet override      09/09/17 0635 09/09/17 0730   09/09/17 0603  ceFAZolin (ANCEF) IVPB 2g/100 mL premix     2 g 200 mL/hr over 30 Minutes Intravenous On call to O.R. 09/09/17 0603 09/09/17 0740       Patient was given sequential compression devices, early ambulation, and chemoprophylaxis to  prevent DVT.  Patient benefited maximally from hospital stay and there were no complications.    Recent vital signs: Patient Vitals for the past 24 hrs:  BP Temp Temp src Pulse Resp SpO2  09/11/17 0430 (!) 128/93 97.6 F (36.4 C) Oral - 17 98 %  09/10/17 2236 137/75 97.8 F (36.6 C) Oral 68 18 97 %  09/10/17 1427 126/73 98.6 F (37 C) Oral 88 18 96 %  09/10/17 1035 123/76 98.6 F (37 C) Oral 74 18 98 %     Recent laboratory studies:  Recent Labs  09/10/17 0607 09/11/17 0527  WBC 11.7* 11.4*  HGB 10.3* 9.2*  HCT 29.5* 25.8*  PLT 171 157  NA 138  --   K 4.7  --   CL 104  --   CO2 27  --   BUN 11  --   CREATININE 0.80  --   GLUCOSE 166*  --   CALCIUM 8.4*  --      Discharge Medications:   Allergies as of 09/11/2017   No Known Allergies     Medication List    STOP taking these medications   HYDROcodone-acetaminophen 7.5-325 MG tablet Commonly known as:  NORCO   ibuprofen 200 MG tablet Commonly known as:  ADVIL,MOTRIN     TAKE these medications   acetaminophen 325 MG tablet Commonly known as:  TYLENOL Take 650 mg by mouth every 6 (six) hours as needed for mild pain or moderate pain.   ALPRAZolam 1 MG tablet Commonly known as:  XANAX TAKE 1 TABLET BY  MOUTH AT BEDTIME AS NEEDED What changed:  See the new instructions.   amLODipine 5 MG tablet Commonly known as:  NORVASC Take 1 tablet (5 mg total) by mouth daily.   aspirin EC 325 MG tablet Take 1 tablet (325 mg total) by mouth 2 (two) times daily after a meal. Take x 1 month post op to decrease risk of blood clots.   docusate sodium 100 MG capsule Commonly known as:  COLACE Take 1 capsule (100 mg total) by mouth 2 (two) times daily.   gabapentin 100 MG capsule Commonly known as:  NEURONTIN Take 2 capsules (200 mg total) by mouth at bedtime.   oxyCODONE-acetaminophen 5-325 MG tablet Commonly known as:  PERCOCET/ROXICET Take 1-2 tablets by mouth every 6 (six) hours as needed for severe pain.    pantoprazole 40 MG tablet Commonly known as:  PROTONIX Take 1 tablet (40 mg total) by mouth daily. What changed:  when to take this  reasons to take this   tiZANidine 2 MG tablet Commonly known as:  ZANAFLEX Take 1 tablet (2 mg total) by mouth every 8 (eight) hours as needed for muscle spasms.            Discharge Care Instructions        Start     Ordered   09/11/17 0000  Weight bearing as tolerated    Question Answer Comment  Laterality left   Extremity Lower      09/11/17 0934      Diagnostic Studies: Dg Chest 2 View  Result Date: 09/05/2017 CLINICAL DATA:  Preop for hip surgery. EXAM: CHEST  2 VIEW COMPARISON:  None. FINDINGS: The heart size and mediastinal contours are within normal limits. Both lungs are clear. The visualized skeletal structures are unremarkable. IMPRESSION: No active cardiopulmonary disease. Electronically Signed   By: Marijo Conception, M.D.   On: 09/05/2017 16:35   Dg C-arm 1-60 Min-no Report  Result Date: 09/09/2017 Fluoroscopy was utilized by the requesting physician.  No radiographic interpretation.   Dg Hip Unilat With Pelvis 2-3 Views Left  Result Date: 09/10/2017 CLINICAL DATA:  61 year old male with left hip pain. Patient is status post recent hip surgery. EXAM: DG HIP (WITH OR WITHOUT PELVIS) 2-3V LEFT COMPARISON:  Intraoperative radiographs 09/09/2017 FINDINGS: Surgical changes of left hip arthroplasty. The femoral head component is located within the acetabular component. No evidence of periprosthetic fracture or other acute complication. Mild right hip joint osteoarthritis. IMPRESSION: Surgical changes of left hip arthroplasty without evidence of complication. Mild right hip joint osteoarthritis. Electronically Signed   By: Jacqulynn Cadet M.D.   On: 09/10/2017 09:44    Disposition: Final discharge disposition not confirmed  Discharge Instructions    Call MD / Call 911    Complete by:  As directed    If you experience chest  pain or shortness of breath, CALL 911 and be transported to the hospital emergency room.  If you develope a fever above 101 F, pus (white drainage) or increased drainage or redness at the wound, or calf pain, call your surgeon's office.   Constipation Prevention    Complete by:  As directed    Drink plenty of fluids.  Prune juice may be helpful.  You may use a stool softener, such as Colace (over the counter) 100 mg twice a day.  Use MiraLax (over the counter) for constipation as needed.   Diet general    Complete by:  As directed    Do not sit  on low chairs, stoools or toilet seats, as it may be difficult to get up from low surfaces    Complete by:  As directed    Increase activity slowly as tolerated    Complete by:  As directed    Weight bearing as tolerated    Complete by:  As directed    Laterality:  left   Extremity:  Lower      Follow-up Information    Dorna Leitz, MD. Schedule an appointment as soon as possible for a visit in 2 week(s).   Specialty:  Orthopedic Surgery Contact information: [place] [address] [phone]            Signed: Erlene Senters 09/11/2017, 9:34 AM

## 2017-09-11 NOTE — Progress Notes (Signed)
Physical Therapy Treatment Patient Details Name: Randy Walker MRN: [id number] DOB: Aug 01, 1956 Today's Date: 09/11/2017    History of Present Illness Pt s/p L THR    PT Comments    Pt progressing well with mobility and eager for dc home.  Spouse present and reviewed stairs, car transfers and therex.   Follow Up Recommendations  Home health PT;DC plan and follow up therapy as arranged by surgeon     Equipment Recommendations  None recommended by PT    Recommendations for Other Services OT consult     Precautions / Restrictions Precautions Precautions: Fall Restrictions Weight Bearing Restrictions: No Other Position/Activity Restrictions: WBAT    Mobility  Bed Mobility Overal bed mobility: Needs Assistance Bed Mobility: Supine to Sit;Sit to Supine     Supine to sit: Supervision Sit to supine: Supervision   General bed mobility comments: Pt self assisting L LE with UEs  Transfers Overall transfer level: Needs assistance Equipment used: Rolling walker (2 wheeled) Transfers: Sit to/from Stand Sit to Stand: Supervision         General transfer comment: cues for LE management and use of UEs to self assist  Ambulation/Gait Ambulation/Gait assistance: Min guard;Supervision Ambulation Distance (Feet): 444 Feet Assistive device: Rolling walker (2 wheeled) Gait Pattern/deviations: Step-to pattern;Decreased step length - right;Decreased step length - left;Shuffle Gait velocity: decr Gait velocity interpretation: Below normal speed for age/gender General Gait Details: min cues for sequence, posture and position from RW    Stairs Stairs: Yes   Stair Management: No rails;Step to pattern;Backwards;With walker Number of Stairs: 4 General stair comments: cues for sequence and foot/RW placement.  Spouse present and written instruction provided  Wheelchair Mobility    Modified Rankin (Stroke Patients Only)       Balance Overall balance assessment: No apparent  balance deficits (not formally assessed)                                          Cognition Arousal/Alertness: Awake/alert Behavior During Therapy: WFL for tasks assessed/performed;Impulsive Overall Cognitive Status: Within Functional Limits for tasks assessed                                        Exercises Total Joint Exercises Ankle Circles/Pumps: AROM;Both;15 reps;Supine Quad Sets: AROM;Both;10 reps;Supine Heel Slides: AAROM;20 reps;Supine;Left Hip ABduction/ADduction: AAROM;Left;15 reps;Supine Long Arc Quad: AROM;Left;10 reps;Seated    General Comments        Pertinent Vitals/Pain Pain Assessment: 0-10 Pain Score: 3  Pain Location: L hip Pain Descriptors / Indicators: Aching;Burning;Sore Pain Intervention(s): Limited activity within patient's tolerance;Monitored during session;Premedicated before session;Ice applied    Home Living                      Prior Function            PT Goals (current goals can now be found in the care plan section) Acute Rehab PT Goals Patient Stated Goal: Regain IND  PT Goal Formulation: With patient Time For Goal Achievement: 09/14/17 Potential to Achieve Goals: Good Progress towards PT goals: Progressing toward goals    Frequency    7X/week      PT Plan Current plan remains appropriate    Co-evaluation  AM-PAC PT "6 Clicks" Daily Activity  Outcome Measure  Difficulty turning over in bed (including adjusting bedclothes, sheets and blankets)?: A Little Difficulty moving from lying on back to sitting on the side of the bed? : A Little Difficulty sitting down on and standing up from a chair with arms (e.g., wheelchair, bedside commode, etc,.)?: A Little Help needed moving to and from a bed to chair (including a wheelchair)?: A Little Help needed walking in hospital room?: A Little Help needed climbing 3-5 steps with a railing? : A Little 6 Click Score: 18     End of Session Equipment Utilized During Treatment: Gait belt Activity Tolerance: Patient tolerated treatment well Patient left: Other (comment) (sitting EOB with spouse) Nurse Communication: Mobility status PT Visit Diagnosis: Difficulty in walking, not elsewhere classified (R26.2)     Time: 8110-3159 PT Time Calculation (min) (ACUTE ONLY): 30 min  Charges:  $Gait Training: 8-22 mins $Therapeutic Exercise: 8-22 mins                    G Codes:       Pg [phone]    Yasmen Cortner 09/11/2017, 12:31 PM

## 2017-09-13 DIAGNOSIS — L4052 Psoriatic arthritis mutilans: Secondary | ICD-10-CM | POA: Diagnosis not present

## 2017-09-13 DIAGNOSIS — M1611 Unilateral primary osteoarthritis, right hip: Secondary | ICD-10-CM | POA: Diagnosis not present

## 2017-09-13 DIAGNOSIS — Z471 Aftercare following joint replacement surgery: Secondary | ICD-10-CM | POA: Diagnosis not present

## 2017-09-15 DIAGNOSIS — M1611 Unilateral primary osteoarthritis, right hip: Secondary | ICD-10-CM | POA: Diagnosis not present

## 2017-09-15 DIAGNOSIS — L4052 Psoriatic arthritis mutilans: Secondary | ICD-10-CM | POA: Diagnosis not present

## 2017-09-15 DIAGNOSIS — Z471 Aftercare following joint replacement surgery: Secondary | ICD-10-CM | POA: Diagnosis not present

## 2017-09-16 DIAGNOSIS — Z471 Aftercare following joint replacement surgery: Secondary | ICD-10-CM | POA: Diagnosis not present

## 2017-09-16 DIAGNOSIS — L4052 Psoriatic arthritis mutilans: Secondary | ICD-10-CM | POA: Diagnosis not present

## 2017-09-16 DIAGNOSIS — M1611 Unilateral primary osteoarthritis, right hip: Secondary | ICD-10-CM | POA: Diagnosis not present

## 2017-09-19 DIAGNOSIS — Z471 Aftercare following joint replacement surgery: Secondary | ICD-10-CM | POA: Diagnosis not present

## 2017-09-19 DIAGNOSIS — L4052 Psoriatic arthritis mutilans: Secondary | ICD-10-CM | POA: Diagnosis not present

## 2017-09-19 DIAGNOSIS — M1611 Unilateral primary osteoarthritis, right hip: Secondary | ICD-10-CM | POA: Diagnosis not present

## 2017-09-21 DIAGNOSIS — Z471 Aftercare following joint replacement surgery: Secondary | ICD-10-CM | POA: Diagnosis not present

## 2017-09-21 DIAGNOSIS — L4052 Psoriatic arthritis mutilans: Secondary | ICD-10-CM | POA: Diagnosis not present

## 2017-09-21 DIAGNOSIS — M1611 Unilateral primary osteoarthritis, right hip: Secondary | ICD-10-CM | POA: Diagnosis not present

## 2017-09-23 DIAGNOSIS — Z471 Aftercare following joint replacement surgery: Secondary | ICD-10-CM | POA: Diagnosis not present

## 2017-09-23 DIAGNOSIS — L4052 Psoriatic arthritis mutilans: Secondary | ICD-10-CM | POA: Diagnosis not present

## 2017-09-23 DIAGNOSIS — M1611 Unilateral primary osteoarthritis, right hip: Secondary | ICD-10-CM | POA: Diagnosis not present

## 2017-09-26 DIAGNOSIS — M1612 Unilateral primary osteoarthritis, left hip: Secondary | ICD-10-CM | POA: Diagnosis not present

## 2017-09-26 DIAGNOSIS — Z96642 Presence of left artificial hip joint: Secondary | ICD-10-CM | POA: Diagnosis not present

## 2017-09-26 DIAGNOSIS — M96842 Postprocedural seroma of a musculoskeletal structure following a musculoskeletal system procedure: Secondary | ICD-10-CM | POA: Diagnosis not present

## 2017-10-03 DIAGNOSIS — M25562 Pain in left knee: Secondary | ICD-10-CM | POA: Diagnosis not present

## 2017-10-03 DIAGNOSIS — Z9889 Other specified postprocedural states: Secondary | ICD-10-CM | POA: Diagnosis not present

## 2017-10-18 DIAGNOSIS — Z9889 Other specified postprocedural states: Secondary | ICD-10-CM | POA: Diagnosis not present

## 2017-10-18 DIAGNOSIS — M25552 Pain in left hip: Secondary | ICD-10-CM | POA: Diagnosis not present

## 2017-11-17 ENCOUNTER — Other Ambulatory Visit: Payer: Self-pay | Admitting: Internal Medicine

## 2017-11-17 NOTE — Telephone Encounter (Signed)
Done erx 

## 2017-11-28 DIAGNOSIS — L400 Psoriasis vulgaris: Secondary | ICD-10-CM | POA: Diagnosis not present

## 2018-01-10 ENCOUNTER — Encounter: Payer: Self-pay | Admitting: Internal Medicine

## 2018-01-10 ENCOUNTER — Ambulatory Visit (INDEPENDENT_AMBULATORY_CARE_PROVIDER_SITE_OTHER): Payer: 59 | Admitting: Internal Medicine

## 2018-01-10 VITALS — BP 140/96 | HR 94 | Temp 97.8°F | Ht 74.0 in | Wt 197.0 lb

## 2018-01-10 DIAGNOSIS — M545 Low back pain: Secondary | ICD-10-CM | POA: Diagnosis not present

## 2018-01-10 DIAGNOSIS — Z0001 Encounter for general adult medical examination with abnormal findings: Secondary | ICD-10-CM | POA: Diagnosis not present

## 2018-01-10 DIAGNOSIS — R739 Hyperglycemia, unspecified: Secondary | ICD-10-CM | POA: Diagnosis not present

## 2018-01-10 DIAGNOSIS — Z1159 Encounter for screening for other viral diseases: Secondary | ICD-10-CM | POA: Diagnosis not present

## 2018-01-10 DIAGNOSIS — T839XXA Unspecified complication of genitourinary prosthetic device, implant and graft, initial encounter: Secondary | ICD-10-CM | POA: Diagnosis not present

## 2018-01-10 DIAGNOSIS — E785 Hyperlipidemia, unspecified: Secondary | ICD-10-CM

## 2018-01-10 DIAGNOSIS — G4700 Insomnia, unspecified: Secondary | ICD-10-CM

## 2018-01-10 DIAGNOSIS — D62 Acute posthemorrhagic anemia: Secondary | ICD-10-CM

## 2018-01-10 DIAGNOSIS — F411 Generalized anxiety disorder: Secondary | ICD-10-CM

## 2018-01-10 DIAGNOSIS — Z114 Encounter for screening for human immunodeficiency virus [HIV]: Secondary | ICD-10-CM

## 2018-01-10 DIAGNOSIS — G8929 Other chronic pain: Secondary | ICD-10-CM | POA: Diagnosis not present

## 2018-01-10 DIAGNOSIS — I10 Essential (primary) hypertension: Secondary | ICD-10-CM | POA: Diagnosis not present

## 2018-01-10 MED ORDER — LOSARTAN POTASSIUM 50 MG PO TABS: 50.0000 mg | ORAL_TABLET | Freq: Every day | ORAL | 3 refills | Status: DC

## 2018-01-10 MED ORDER — ALPRAZOLAM 1 MG PO TABS: 1.0000 mg | ORAL_TABLET | Freq: Every evening | ORAL | 5 refills | Status: DC | PRN

## 2018-01-10 MED ORDER — OXYCODONE-ACETAMINOPHEN 5-325 MG PO TABS: 1.0000 | ORAL_TABLET | Freq: Two times a day (BID) | ORAL | 0 refills | Status: DC | PRN

## 2018-01-10 NOTE — Patient Instructions (Addendum)
Please take all new medication as prescribed - the losartan 50 mg per day  Please continue all other medications as before, and refills have been done if requested - the pain medication, and xanax  Please have the pharmacy call with any other refills you may need.  Please continue your efforts at being more active, low cholesterol diet, and weight control.  You are otherwise up to date with prevention measures today.  Please keep your appointments with your specialists as you may have planned  You will be contacted regarding the referral for: Urology  Please go to the LAB in the Basement (turn left off the elevator) for the tests to be done at your convenience  You will be contacted by phone if any changes need to be made immediately.  Otherwise, you will receive a letter about your results with an explanation, but please check with MyChart first.  Please remember to sign up for MyChart if you have not done so, as this will be important to you in the future with finding out test results, communicating by private email, and scheduling acute appointments online when needed.  Please return in 6 months, or sooner if needed

## 2018-01-10 NOTE — Assessment & Plan Note (Signed)
Suggested pt try otc melatonin whs prn,  to f/u any worsening symptoms or concerns

## 2018-01-10 NOTE — Assessment & Plan Note (Signed)
D/w pt, states he can manage with #40 per month of hydrocodone, no hx of abuse or diversion, for refill

## 2018-01-10 NOTE — Assessment & Plan Note (Signed)

## 2018-01-10 NOTE — Assessment & Plan Note (Addendum)
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

## 2018-01-10 NOTE — Assessment & Plan Note (Signed)
Likely improved, for f/u cbc with labs

## 2018-01-10 NOTE — Assessment & Plan Note (Signed)
Mild uncontrolled, he is now willing for tx, for losartan 50 mg daily

## 2018-01-10 NOTE — Assessment & Plan Note (Signed)
stable overall by history and exam, and pt to continue medical treatment as before,  to f/u any worsening symptoms or concerns, for xanax refill

## 2018-01-10 NOTE — Assessment & Plan Note (Signed)
Mild postop, for a1c with labs, cont wt control efforts

## 2018-01-10 NOTE — Progress Notes (Signed)
Subjective:    Patient ID: Randy Walker, male    DOB: Oct 17, 1956, 62 y.o.   MRN: [id number]  HPI    Here for wellness and f/u;  Overall doing ok; Pt denies neurological change such as new headache, facial or extremity weakness.  Pt denies polydipsia, polyuria, or low sugar symptoms. Pt states overall good compliance with treatment and medications, good tolerability, and has been trying to follow appropriate diet.  No fever, night sweats, wt loss, loss of appetite, or other constitutional symptoms.  Pt states good ability with ADL's, has low fall risk, home safety reviewed and adequate, no other significant changes in hearing or vision, and only occasionally active with exercise.  Still working  Pt continues to have recurring LBP without change in severity, bowel or bladder change, fever, wt loss,  worsening LE pain/numbness/weakness, gait change or falls, and currently takes about 15 advil per day.   Never took the amlodipine for BP, but checking at home has several > 140/90, Pt denies chest pain, increased sob or doe, wheezing, orthopnea, PND, increased LE swelling, palpitations, dizziness or syncope. Denies worsening depressive symptoms, suicidal ideation, or panic; has ongoing anxiety, not increased recently but chronically persistent.  Also with persistent sleep difficulty especially getting to sleep. Also c/o recent apparent penile injury related to oct 2018 surgury involving foley catheter placed while under anesthesia, but with severe pain on removal, and erections very painful, unable to have intercourse due to pain and a strange mid penile shaft kink.   Past Medical History:  Diagnosis Date  . Adenomatous polyps 11/17/2011  . Alcohol use 11/17/2011  . ALLERGIC RHINITIS 09/10/2007  . ANXIETY 09/10/2007  . BACK PAIN 12/31/2010  . [place] DISEASE, LUMBAR 12/31/2010  . GERD (gastroesophageal reflux disease) 11/23/2016  . Hyperglycemia 11/23/2016  . HYPERLIPIDEMIA 12/31/2010  . Hypertension   .  Personal history of rectal adenomas 01/29/2008  . PSORIASIS 12/31/2010  . PULMONARY EMBOLISM, HX OF 09/10/2007  . RASH-NONVESICULAR 04/23/2009  . SMOKER 12/31/2010   Past Surgical History:  Procedure Laterality Date  . ROTATOR CUFF REPAIR  Jan, 2010   [facility], Left  . spleen repair    . 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  He has been smoking about 0.50 packs per day. he has never used smokeless tobacco. He reports that he drinks alcohol. He reports that he does not use drugs. family history is not on file. No Known Allergies Current Outpatient Medications on File Prior to Visit  Medication Sig Dispense Refill  . aspirin EC 325 MG tablet Take 1 tablet (325 mg total) by mouth 2 (two) times daily after a meal. Take x 1 month post op to decrease risk of blood clots. 60 tablet 0   No current facility-administered medications on file prior to visit.      Review of Systems Constitutional: Negative for other unusual diaphoresis, sweats, appetite or weight changes HENT: Negative for other worsening hearing loss, ear pain, facial swelling, mouth sores or neck stiffness.   Eyes: Negative for other worsening pain, redness or other visual disturbance.  Respiratory: Negative for other stridor or swelling Cardiovascular: Negative for other palpitations or other chest pain  Gastrointestinal: Negative for worsening diarrhea or loose stools, blood in stool, distention or other pain Genitourinary: Negative for hematuria, flank  pain or other change in urine volume.  Musculoskeletal: Negative for myalgias or other joint swelling.  Skin: Negative for other color change, or other wound or worsening drainage.  Neurological: Negative for other syncope or numbness. Hematological: Negative for other adenopathy or  swelling Psychiatric/Behavioral: Negative for hallucinations, other worsening agitation, SI, self-injury, or new decreased concentration All other system neg per pt    Objective:   Physical Exam BP (!) 140/96   Pulse 94   Temp 97.8 F (36.6 C) (Oral)   Ht 6\' 2"  (1.88 m)   Wt 197 lb (89.4 kg)   SpO2 96%   BMI 25.29 kg/m  VS noted,  Constitutional: Pt is oriented to person, place, and time. Appears well-developed and well-nourished, in no significant distress and comfortable Head: Normocephalic and atraumatic  Eyes: Conjunctivae and EOM are normal. Pupils are equal, round, and reactive to light Right Ear: External ear normal without discharge Left Ear: External ear normal without discharge Nose: Nose without discharge or deformity Mouth/Throat: Oropharynx is without other ulcerations and moist  Neck: Normal range of motion. Neck supple. No JVD present. No tracheal deviation present or significant neck LA or mass Cardiovascular: Normal rate, regular rhythm, normal heart sounds and intact distal pulses.   Pulmonary/Chest: WOB normal and breath sounds without rales or wheezing  Abdominal: Soft. Bowel sounds are normal. NT. No HSM  Musculoskeletal: Normal range of motion. Exhibits no edema Lymphadenopathy: Has no other cervical adenopathy.  Neurological: Pt is alert and oriented to person, place, and time. Pt has normal reflexes. No cranial nerve deficit. Motor grossly intact, Gait intact Skin: Skin is warm and dry. No rash noted or new ulcerations Psychiatric:  Has normal mood and affect. Behavior is normal without agitation No other exam findings Lab Results  Component Value Date   WBC 11.4 (H) 09/11/2017   HGB 9.2 (L) 09/11/2017   HCT 25.8 (L) 09/11/2017   PLT 157 09/11/2017   GLUCOSE 166 (H) 09/10/2017   CHOL 212 (H) 11/18/2016   TRIG 399.0 (H) 11/18/2016   HDL 64.30 11/18/2016   LDLDIRECT 90.0 11/18/2016   LDLCALC 139 (H) 09/26/2014   ALT 24 09/05/2017   AST 27 09/05/2017    NA 138 09/10/2017   K 4.7 09/10/2017   CL 104 09/10/2017   CREATININE 0.80 09/10/2017   BUN 11 09/10/2017   CO2 27 09/10/2017   TSH 2.45 11/18/2016   PSA 0.46 11/18/2016   INR 0.94 09/05/2017          Assessment & Plan:

## 2018-01-10 NOTE — Assessment & Plan Note (Signed)
Lab Results  Component Value Date   LDLCALC 139 (H) 09/26/2014  for lower chol diet, f/u lipids with lab, consider statin

## 2018-02-05 IMAGING — DX DG HIP (WITH OR WITHOUT PELVIS) 3-4V BILAT
5 series · 5 of 5 positions shown · non-contrast
Comparison: None.

CLINICAL DATA: Left hip pain for 3-4 months

EXAM:
DG HIP (WITH OR WITHOUT PELVIS) 3-4V BILAT

[pelvis ap]
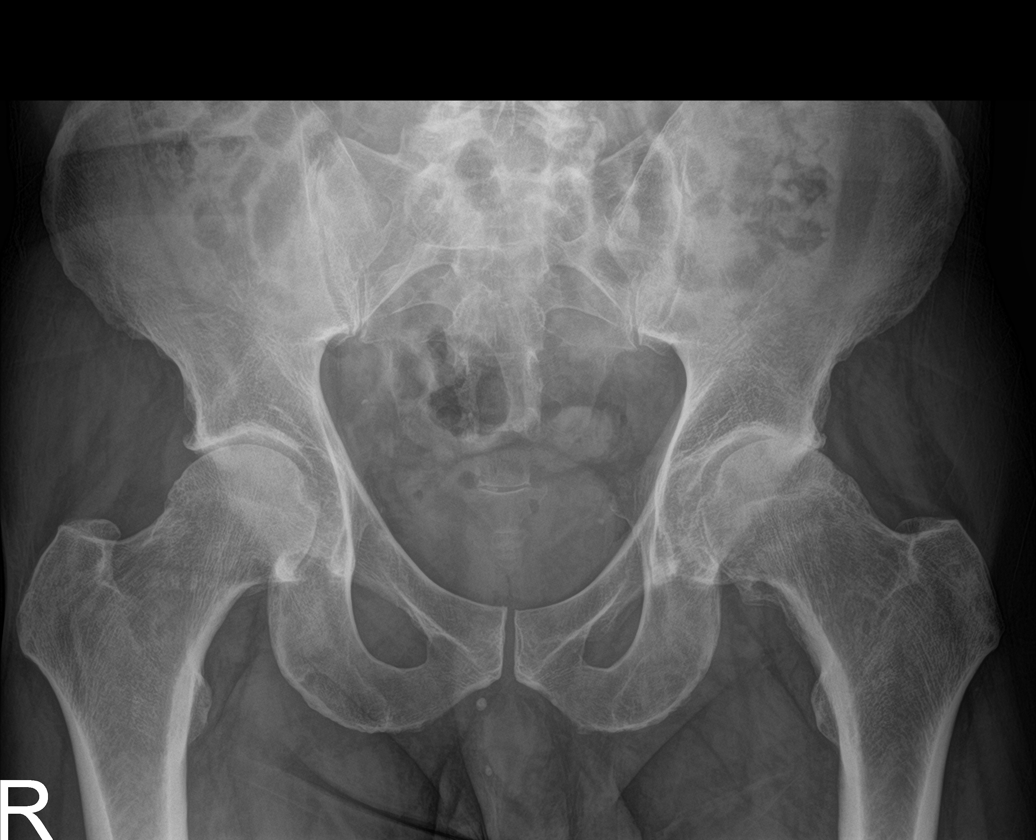

[hip ap (1 of 2)]
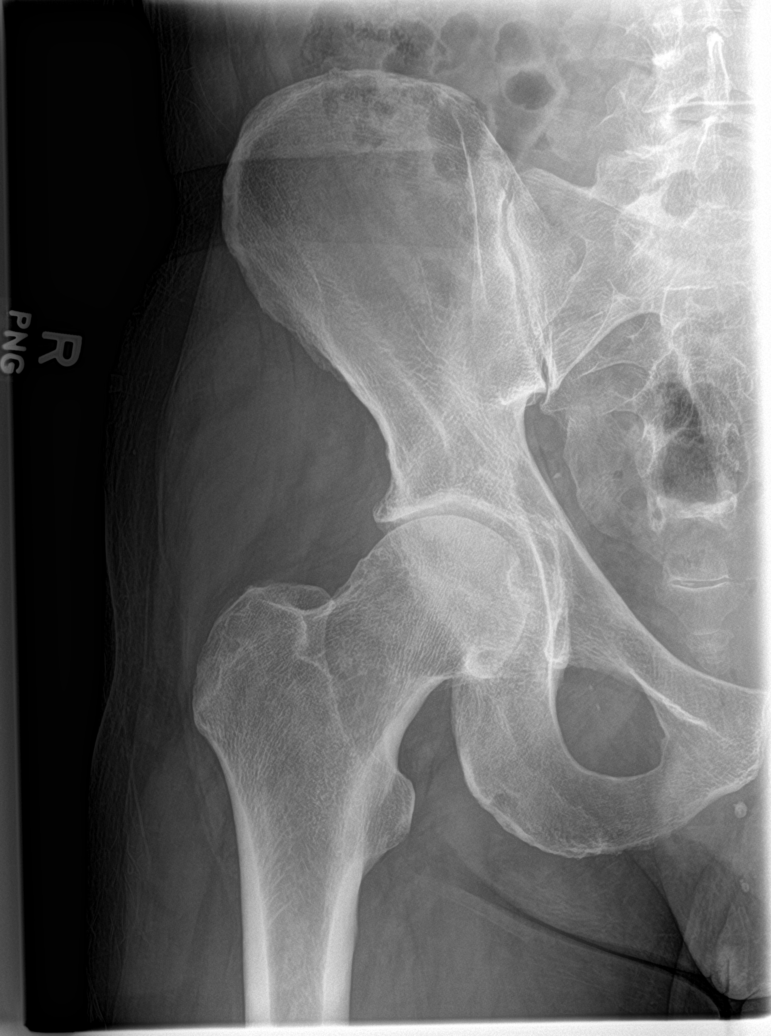

[hip ap (2 of 2)]
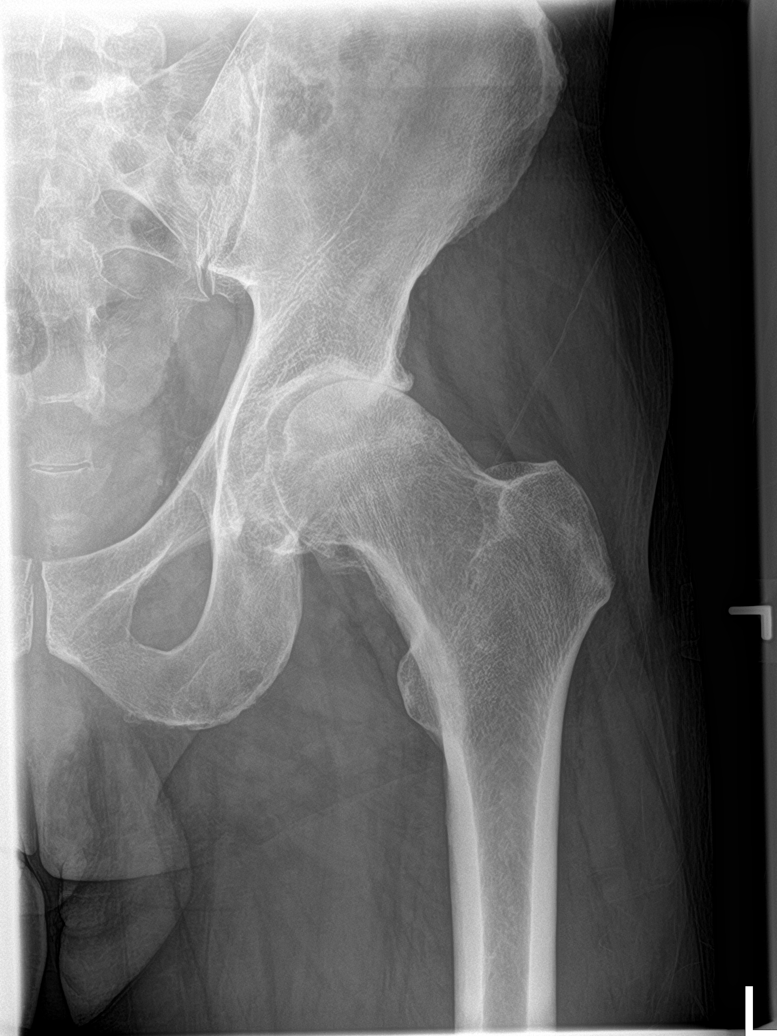

[hip frog leg (1 of 2)]
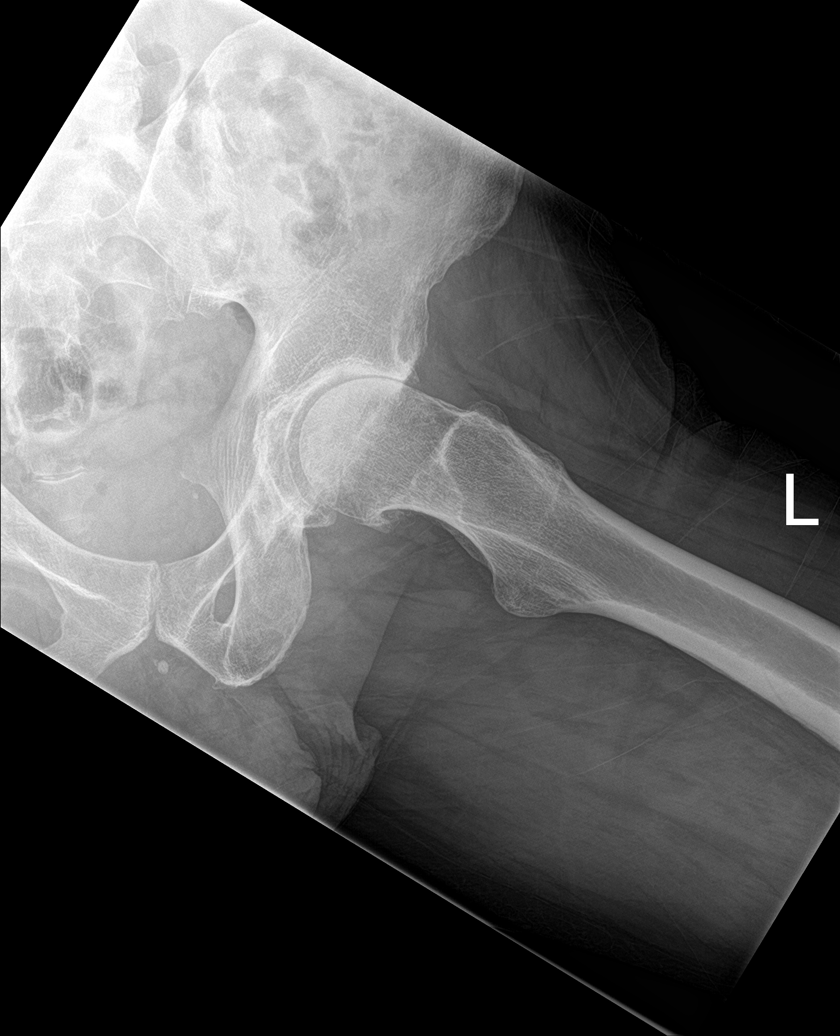

[hip frog leg (2 of 2)]
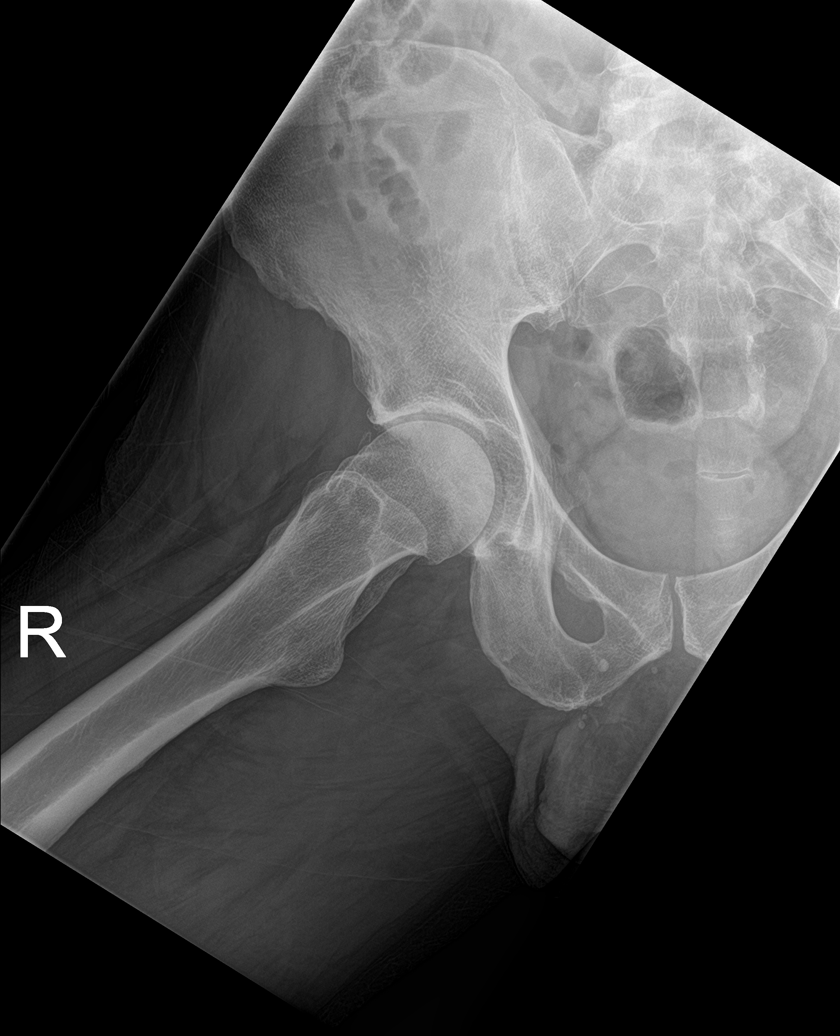

[5 of 5 positions shown; findings below may reference images not displayed]

FINDINGS: No acute fracture or dislocation. Severe osteoarthritis of the left
hip with severe superior joint space narrowing with a bone-on-bone
appearance and subchondral sclerosis. Mild osteoarthritis of the
right hip with mild joint space narrowing.

No lytic or sclerotic osseous lesion.
IMPRESSION: 1. Severe osteoarthritis of the left hip.
2. Mild osteoarthritis of the right hip.

## 2018-02-06 ENCOUNTER — Other Ambulatory Visit: Payer: Self-pay | Admitting: Internal Medicine

## 2018-02-06 DIAGNOSIS — Z0283 Encounter for blood-alcohol and blood-drug test: Secondary | ICD-10-CM

## 2018-02-07 ENCOUNTER — Other Ambulatory Visit (INDEPENDENT_AMBULATORY_CARE_PROVIDER_SITE_OTHER): Payer: 59

## 2018-02-07 DIAGNOSIS — R739 Hyperglycemia, unspecified: Secondary | ICD-10-CM | POA: Diagnosis not present

## 2018-02-07 DIAGNOSIS — Z0283 Encounter for blood-alcohol and blood-drug test: Secondary | ICD-10-CM | POA: Diagnosis not present

## 2018-02-07 DIAGNOSIS — Z0001 Encounter for general adult medical examination with abnormal findings: Secondary | ICD-10-CM | POA: Diagnosis not present

## 2018-02-07 DIAGNOSIS — Z1159 Encounter for screening for other viral diseases: Secondary | ICD-10-CM | POA: Diagnosis not present

## 2018-02-07 DIAGNOSIS — Z114 Encounter for screening for human immunodeficiency virus [HIV]: Secondary | ICD-10-CM

## 2018-02-07 LAB — URINALYSIS, ROUTINE W REFLEX MICROSCOPIC
Bilirubin Urine: NEGATIVE
Hgb urine dipstick: NEGATIVE
Leukocytes, UA: NEGATIVE
Nitrite: NEGATIVE
Specific Gravity, Urine: >=1.03 — AB (ref 1.000–1.030)
Total Protein, Urine: NEGATIVE
Urine Glucose: NEGATIVE
Urobilinogen, UA: 0.2 (ref 0.0–1.0)
pH: 5 (ref 5.0–8.0)

## 2018-02-07 LAB — LIPID PANEL
Cholesterol: 177 mg/dL (ref 0–200)
HDL: 65.7 mg/dL (ref >39.00)
NonHDL: 111.34
Total CHOL/HDL Ratio: 3
Triglycerides: 223 mg/dL — ABNORMAL HIGH (ref 0.0–149.0)
VLDL: 44.6 mg/dL — ABNORMAL HIGH (ref 0.0–40.0)

## 2018-02-07 LAB — HEPATIC FUNCTION PANEL
ALT: 27 U/L (ref 0–53)
AST: 23 U/L (ref 0–37)
Albumin: 4.3 g/dL (ref 3.5–5.2)
Alkaline Phosphatase: 100 U/L (ref 39–117)
Bilirubin, Direct: 0.1 mg/dL (ref 0.0–0.3)
Total Bilirubin: 0.4 mg/dL (ref 0.2–1.2)
Total Protein: 6.6 g/dL (ref 6.0–8.3)

## 2018-02-07 LAB — CBC WITH DIFFERENTIAL/PLATELET
Basophils Absolute: 0 10*3/uL (ref 0.0–0.1)
Basophils Relative: 0.9 % (ref 0.0–3.0)
Eosinophils Absolute: 0.1 10*3/uL (ref 0.0–0.7)
Eosinophils Relative: 1.5 % (ref 0.0–5.0)
HCT: 41.5 % (ref 39.0–52.0)
Hemoglobin: 14.5 g/dL (ref 13.0–17.0)
Lymphocytes Relative: 41.9 % (ref 12.0–46.0)
Lymphs Abs: 2.2 10*3/uL (ref 0.7–4.0)
MCHC: 35 g/dL (ref 30.0–36.0)
MCV: 95.2 fl (ref 78.0–100.0)
Monocytes Absolute: 0.5 10*3/uL (ref 0.1–1.0)
Monocytes Relative: 9.2 % (ref 3.0–12.0)
Neutro Abs: 2.4 10*3/uL (ref 1.4–7.7)
Neutrophils Relative %: 46.5 % (ref 43.0–77.0)
Platelets: 232 10*3/uL (ref 150.0–400.0)
RBC: 4.36 Mil/uL (ref 4.22–5.81)
RDW: 13.6 % (ref 11.5–15.5)
WBC: 5.2 10*3/uL (ref 4.0–10.5)

## 2018-02-07 LAB — BASIC METABOLIC PANEL
BUN: 13 mg/dL (ref 6–23)
CO2: 25 mEq/L (ref 19–32)
Calcium: 9.7 mg/dL (ref 8.4–10.5)
Chloride: 106 mEq/L (ref 96–112)
Creatinine, Ser: 0.89 mg/dL (ref 0.40–1.50)
GFR: 92.44 mL/min (ref >60.00)
Glucose, Bld: 95 mg/dL (ref 70–99)
Potassium: 4.6 mEq/L (ref 3.5–5.1)
Sodium: 139 mEq/L (ref 135–145)

## 2018-02-07 LAB — HEMOGLOBIN A1C: Hgb A1c MFr Bld: 5.5 % (ref 4.6–6.5)

## 2018-02-07 LAB — LDL CHOLESTEROL, DIRECT: Direct LDL: 82 mg/dL

## 2018-02-07 LAB — PSA: PSA: 0.34 ng/mL (ref 0.10–4.00)

## 2018-02-07 LAB — TSH: TSH: 1.81 u[IU]/mL (ref 0.35–4.50)

## 2018-02-07 NOTE — Telephone Encounter (Signed)
01/10/2018  Please advise in Dr. Judi Cong absence. Thank you!

## 2018-02-07 NOTE — Telephone Encounter (Signed)
Needs UDS as none in past, call for collect and resend once collected

## 2018-02-07 NOTE — Telephone Encounter (Signed)
Called pt, informed him UDS is needed prior to script.   UDS has been ordered

## 2018-02-08 LAB — HIV ANTIBODY (ROUTINE TESTING W REFLEX): HIV 1&2 Ab, 4th Generation: NONREACTIVE

## 2018-02-08 LAB — HEPATITIS C ANTIBODY
Hepatitis C Ab: NONREACTIVE
SIGNAL TO CUT-OFF: 0.03 (ref <1.00)

## 2018-02-08 NOTE — Telephone Encounter (Signed)
Done erx 

## 2018-02-08 NOTE — Telephone Encounter (Signed)
I seen than the UDS pas been completed but has not been resulted yet.

## 2018-02-09 DIAGNOSIS — M7702 Medial epicondylitis, left elbow: Secondary | ICD-10-CM | POA: Diagnosis not present

## 2018-02-09 DIAGNOSIS — Z96642 Presence of left artificial hip joint: Secondary | ICD-10-CM | POA: Diagnosis not present

## 2018-02-09 DIAGNOSIS — M9689 Other intraoperative and postprocedural complications and disorders of the musculoskeletal system: Secondary | ICD-10-CM | POA: Diagnosis not present

## 2018-02-09 DIAGNOSIS — M25552 Pain in left hip: Secondary | ICD-10-CM | POA: Diagnosis not present

## 2018-02-10 LAB — PAIN MGMT, PROFILE 8 W/CONF, U
6 Acetylmorphine: NEGATIVE ng/mL (ref <10)
Alcohol Metabolites: POSITIVE ng/mL — AB (ref <500)
Alphahydroxyalprazolam: 815 ng/mL — ABNORMAL HIGH (ref <25)
Alphahydroxymidazolam: NEGATIVE ng/mL (ref <50)
Alphahydroxytriazolam: NEGATIVE ng/mL (ref <50)
Aminoclonazepam: NEGATIVE ng/mL (ref <25)
Amphetamines: NEGATIVE ng/mL (ref <500)
Benzodiazepines: POSITIVE ng/mL — AB (ref <100)
Buprenorphine, Urine: NEGATIVE ng/mL (ref <5)
Cocaine Metabolite: NEGATIVE ng/mL (ref <150)
Codeine: 227 ng/mL — ABNORMAL HIGH (ref <50)
Creatinine: 224.8 mg/dL
Ethyl Glucuronide (ETG): 230597 ng/mL — ABNORMAL HIGH (ref <500)
Ethyl Sulfate (ETS): 33142 ng/mL — ABNORMAL HIGH (ref <100)
Hydrocodone: NEGATIVE ng/mL (ref <50)
Hydromorphone: NEGATIVE ng/mL (ref <50)
Hydroxyethylflurazepam: NEGATIVE ng/mL (ref <50)
Lorazepam: NEGATIVE ng/mL (ref <50)
MDMA: NEGATIVE ng/mL (ref <500)
Marijuana Metabolite: NEGATIVE ng/mL (ref <20)
Morphine: NEGATIVE ng/mL (ref <50)
Nordiazepam: NEGATIVE ng/mL (ref <50)
Norhydrocodone: NEGATIVE ng/mL (ref <50)
Opiates: POSITIVE ng/mL — AB (ref <100)
Oxazepam: NEGATIVE ng/mL (ref <50)
Oxidant: NEGATIVE ug/mL (ref <200)
Oxycodone: NEGATIVE ng/mL (ref <100)
Temazepam: NEGATIVE ng/mL (ref <50)
pH: 5.23 (ref 4.5–9.0)

## 2018-03-07 NOTE — Addendum Note (Signed)
Addended by: Ander Slade on: 03/07/2018 10:00 AM   Modules accepted: Orders

## 2018-03-10 ENCOUNTER — Other Ambulatory Visit: Payer: Self-pay | Admitting: Internal Medicine

## 2018-03-13 NOTE — Telephone Encounter (Signed)
Done erx 

## 2018-03-13 NOTE — Telephone Encounter (Signed)
02/08/2018 40# 

## 2018-04-04 ENCOUNTER — Other Ambulatory Visit: Payer: Self-pay | Admitting: Internal Medicine

## 2018-04-04 NOTE — Telephone Encounter (Signed)
03/10/2018 30# 

## 2018-04-12 ENCOUNTER — Other Ambulatory Visit: Payer: Self-pay | Admitting: Internal Medicine

## 2018-04-12 NOTE — Telephone Encounter (Signed)
03/13/2018  40# 

## 2018-04-12 NOTE — Telephone Encounter (Signed)
Done erx 

## 2018-05-12 ENCOUNTER — Other Ambulatory Visit: Payer: Self-pay | Admitting: Internal Medicine

## 2018-05-15 NOTE — Telephone Encounter (Signed)
04/12/2018 40# 

## 2018-05-15 NOTE — Telephone Encounter (Signed)
Done erx 

## 2018-05-16 DIAGNOSIS — M25552 Pain in left hip: Secondary | ICD-10-CM | POA: Diagnosis not present

## 2018-05-16 DIAGNOSIS — M7702 Medial epicondylitis, left elbow: Secondary | ICD-10-CM | POA: Diagnosis not present

## 2018-06-13 ENCOUNTER — Other Ambulatory Visit: Payer: Self-pay | Admitting: Internal Medicine

## 2018-06-13 NOTE — Telephone Encounter (Signed)
Done erx 

## 2018-06-13 NOTE — Telephone Encounter (Signed)
05/15/2018 40# 

## 2018-06-14 ENCOUNTER — Other Ambulatory Visit: Payer: Self-pay | Admitting: Internal Medicine

## 2018-06-14 ENCOUNTER — Telehealth: Payer: Self-pay

## 2018-06-14 MED ORDER — PANTOPRAZOLE SODIUM 40 MG PO TBEC: 40.0000 mg | DELAYED_RELEASE_TABLET | Freq: Every day | ORAL | 1 refills | Status: DC

## 2018-06-14 NOTE — Telephone Encounter (Signed)
Spoke to a very rude and Psychologist, educational at [employer]. I told him I received a electronic refill error msg with a message stating "on the phone with the receptionist he is your patient" but no correspondence prior to that so I was calling in regard to find out what was needed.  I explained the only refill request received recently was for pain med which was sent yesterday. I informed him that I would get the PCP to look in the chart about protonix because it was not listed as a current medication. The pharmacist then stated "that's the same thing the receptionist said but he is your patient". He the stated "I sent it personally three times this morning and yall keep saying he isn't your patient but he is". I reiterated that I didn't receive any prior error message or medication request for protonix until I called. He said "I can sent you the denial request if you want" and then said "well" and then the pharmacist hung up.   Dr. Jenny Reichmann is this patient suppose to be on protonix? Please advise.    Copied from [place] [id number]. Topic: Quick Communication - See Telephone Encounter >> Jun 14, 2018  9:01 AM Adelene Idler wrote: [facility] is calling in concerning pantoprazole (PROTONIX) 40 MG tablet for the patient. They have sent a refill serveral times and receiving feedback that the pt is a unknown pt of Dr Jenny Reichmann pt was seen in office 12/2017. Please call [facility] concerning med request [phone] Sherren Mocha)

## 2018-06-14 NOTE — Telephone Encounter (Signed)
Pt had been on protonic 40 prn only for a time last yr and early this yr, but I believe at last visit he stated he did not need refill  I have done refill erx

## 2018-06-14 NOTE — Addendum Note (Signed)
Addended by: Biagio Borg on: 06/14/2018 12:42 PM   Modules accepted: Orders

## 2018-07-12 ENCOUNTER — Other Ambulatory Visit: Payer: Self-pay | Admitting: Internal Medicine

## 2018-07-12 ENCOUNTER — Other Ambulatory Visit (INDEPENDENT_AMBULATORY_CARE_PROVIDER_SITE_OTHER): Payer: 59

## 2018-07-12 ENCOUNTER — Encounter: Payer: Self-pay | Admitting: Internal Medicine

## 2018-07-12 ENCOUNTER — Ambulatory Visit (INDEPENDENT_AMBULATORY_CARE_PROVIDER_SITE_OTHER): Payer: 59 | Admitting: Internal Medicine

## 2018-07-12 VITALS — BP 150/102 | HR 62 | Temp 98.1°F | Ht 74.0 in | Wt 193.0 lb

## 2018-07-12 DIAGNOSIS — G8929 Other chronic pain: Secondary | ICD-10-CM | POA: Diagnosis not present

## 2018-07-12 DIAGNOSIS — M545 Low back pain, unspecified: Secondary | ICD-10-CM | POA: Insufficient documentation

## 2018-07-12 DIAGNOSIS — I10 Essential (primary) hypertension: Secondary | ICD-10-CM | POA: Diagnosis not present

## 2018-07-12 LAB — URINALYSIS, ROUTINE W REFLEX MICROSCOPIC
Bilirubin Urine: NEGATIVE
Hgb urine dipstick: NEGATIVE
Ketones, ur: NEGATIVE
Leukocytes, UA: NEGATIVE
Nitrite: NEGATIVE
RBC / HPF: NONE SEEN (ref 0–?)
Specific Gravity, Urine: <=1.005 — AB (ref 1.000–1.030)
Total Protein, Urine: NEGATIVE
Urine Glucose: NEGATIVE
Urobilinogen, UA: 0.2 (ref 0.0–1.0)
pH: 6 (ref 5.0–8.0)

## 2018-07-12 MED ORDER — LOSARTAN POTASSIUM 100 MG PO TABS: 100.0000 mg | ORAL_TABLET | Freq: Every day | ORAL | 3 refills | Status: DC

## 2018-07-12 MED ORDER — AMLODIPINE BESYLATE 10 MG PO TABS: 10.0000 mg | ORAL_TABLET | Freq: Every day | ORAL | 3 refills | Status: DC

## 2018-07-12 MED ORDER — OXYCODONE-ACETAMINOPHEN 5-325 MG PO TABS: ORAL_TABLET | ORAL | 0 refills | Status: DC

## 2018-07-12 MED ORDER — CYCLOBENZAPRINE HCL 5 MG PO TABS: 5.0000 mg | ORAL_TABLET | Freq: Three times a day (TID) | ORAL | 1 refills | Status: DC | PRN

## 2018-07-12 NOTE — Patient Instructions (Addendum)
Ok to increase the losartan 100 mg per day  Please take all new medication as prescribed - the amlodipine 10 mg daily  Please continue all other medications as before, and refills have been done if requested - the pain medication for tomorrow  Please have the pharmacy call with any other refills you may need.  Please continue your efforts at being more active, low cholesterol diet, and weight control.  Please keep your appointments with your specialists as you may have planned  Please go to the LAB in the Basement (turn left off the elevator) for the tests to be done today  You will be contacted by phone if any changes need to be made immediately.  Otherwise, you will receive a letter about your results with an explanation, but please check with MyChart first.  Please remember to sign up for MyChart if you have not done so, as this will be important to you in the future with finding out test results, communicating by private email, and scheduling acute appointments online when needed.  Please return in 6 months, or sooner if needed, with Lab testing done 3-5 days before

## 2018-07-12 NOTE — Progress Notes (Signed)
Subjective:    Patient ID: Randy Walker, male    DOB: 1956/07/16, 62 y.o.   MRN: [id number]  HPI  Here with lbp, about 4 mo ago had famly reuiniion in [place], borther in law was coughing ill which he seemed to catch and severe hard cough and just after had acute onset right low back pain he thinks from coughng.  Seemed to voerall gradually better except LBP actauly go worse to left and right side with some radiation of the pain to the RLQ/right groin area.  Pt continues to have recurring LBP without change in severity, bowel or bladder change, fever, wt loss,  worsening LE pain/numbness/weakness, gait change or falls, has been taking the oxycodone occasinoaly.  OTC medsvnot working well, stands every day for work.  Denies worsening reflux, abd pain, dysphagia, n/v, bowel change or blood.  Denies urinary symptoms such as dysuria, frequency, urgency, flank pain, hematuria or n/v, fever, chills.  Past Medical History:  Diagnosis Date  . Adenomatous polyps 11/17/2011  . Alcohol use 11/17/2011  . ALLERGIC RHINITIS 09/10/2007  . ANXIETY 09/10/2007  . BACK PAIN 12/31/2010  . [place] DISEASE, LUMBAR 12/31/2010  . GERD (gastroesophageal reflux disease) 11/23/2016  . Hyperglycemia 11/23/2016  . HYPERLIPIDEMIA 12/31/2010  . Hypertension   . Personal history of rectal adenomas 01/29/2008  . PSORIASIS 12/31/2010  . PULMONARY EMBOLISM, HX OF 09/10/2007  . RASH-NONVESICULAR 04/23/2009  . SMOKER 12/31/2010   Past Surgical History:  Procedure Laterality Date  . ROTATOR CUFF REPAIR  Jan, 2010   [facility], Left  . spleen repair    . 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 He has been smoking about 0.50 packs per day. He has never used smokeless tobacco. He reports that he drinks alcohol. He reports that he does not use  drugs. family history is not on file. No Known Allergies Current Outpatient Medications on File Prior to Visit  Medication Sig Dispense Refill  . ALPRAZolam (XANAX) 1 MG tablet Take 1 tablet (1 mg total) by mouth at bedtime as needed. 30 tablet 5  . aspirin EC 325 MG tablet Take 1 tablet (325 mg total) by mouth 2 (two) times daily after a meal. Take x 1 month post op to decrease risk of blood clots. 60 tablet 0  . oxyCODONE-acetaminophen (PERCOCET/ROXICET) 5-325 MG tablet TAKE 1 TO 2 TABLETS BY MOUTH TWICE DAILYAS NEEDED FOR SEVERE PAIN 40 tablet 0  . pantoprazole (PROTONIX) 40 MG tablet Take 1 tablet (40 mg total) by mouth daily. 90 tablet 1   No current facility-administered medications on file prior to visit.    Review of Systems  Constitutional: Negative for other unusual diaphoresis or sweats HENT: Negative for ear discharge or swelling Eyes: Negative for other worsening visual disturbances Respiratory: Negative for stridor or other swelling  Gastrointestinal: Negative for worsening distension or other blood Genitourinary: Negative for retention or other urinary change Musculoskeletal: Negative for other MSK pain or swelling Skin: Negative for color change or other new lesions Neurological: Negative for worsening tremors and other numbness  Psychiatric/Behavioral: Negative for worsening agitation or other fatigue All other system neg per pt    Objective:   Physical Exam BP (!) 150/102   Pulse 62   Temp 98.1 F (36.7 C) (Oral)   Ht 6\' 2"  (  1.88 m)   Wt 193 lb (87.5 kg)   SpO2 97%   BMI 24.78 kg/m  VS noted,  Constitutional: Pt appears in NAD HENT: Head: NCAT.  Right Ear: External ear normal.  Left Ear: External ear normal.  Eyes: . Pupils are equal, round, and reactive to light. Conjunctivae and EOM are normal Nose: without d/c or deformity Neck: Neck supple. Gross normal ROM Cardiovascular: Normal rate and regular rhythm.   Pulmonary/Chest: Effort normal and breath  sounds without rales or wheezing.  Abd:  Soft, NT, ND, + BS, no organomegaly Spine nontender, + tender right lumbar paravertebral Neurological: Pt is alert. At baseline orientation, motor grossly intact Skin: Skin is warm. No rashes, other new lesions, no LE edema Psychiatric: Pt behavior is normal without agitation  No other exam findings  Lab Results  Component Value Date   WBC 5.2 02/07/2018   HGB 14.5 02/07/2018   HCT 41.5 02/07/2018   PLT 232.0 02/07/2018   GLUCOSE 95 02/07/2018   CHOL 177 02/07/2018   TRIG 223.0 (H) 02/07/2018   HDL 65.70 02/07/2018   LDLDIRECT 82.0 02/07/2018   LDLCALC 139 (H) 09/26/2014   ALT 27 02/07/2018   AST 23 02/07/2018   NA 139 02/07/2018   K 4.6 02/07/2018   CL 106 02/07/2018   CREATININE 0.89 02/07/2018   BUN 13 02/07/2018   CO2 25 02/07/2018   TSH 1.81 02/07/2018   PSA 0.34 02/07/2018   INR 0.94 09/05/2017   HGBA1C 5.5 02/07/2018       Assessment & Plan:

## 2018-07-12 NOTE — Assessment & Plan Note (Signed)
Most c/w msk strain, for muscle relaxer prn, check UA

## 2018-07-12 NOTE — Assessment & Plan Note (Signed)
Severe uncontrolled, for increased losartan 100 qd, also hadd amlodipine 10 qd, o/w stable overall by history and exam, recent data reviewed with pt, and pt to continue medical treatment as before,  to f/u any worsening symptoms or concerns BP Readings from Last 3 Encounters:  07/12/18 (!) 150/102  01/10/18 (!) 140/96  09/11/17 (!) 128/93

## 2018-07-12 NOTE — Assessment & Plan Note (Signed)
Stable, for pain control

## 2018-07-13 NOTE — Telephone Encounter (Signed)
Last OV 06/13/18  [place] Controlled Substance Database checked. Last filled on 06/13/18

## 2018-07-13 NOTE — Telephone Encounter (Signed)
Done erx 

## 2018-08-14 ENCOUNTER — Other Ambulatory Visit: Payer: Self-pay | Admitting: Internal Medicine

## 2018-08-14 NOTE — Telephone Encounter (Signed)
 Controlled Database Checked Last filled: Oxy # 40 on 07/13/18; Alpraz # 30 on 07/13/18 LOV w/you: 07/12/18 Next appt w/you: None

## 2018-09-13 ENCOUNTER — Other Ambulatory Visit: Payer: Self-pay | Admitting: Internal Medicine

## 2018-09-13 NOTE — Telephone Encounter (Signed)
   LOV:07/12/18 NextOV: not scheduled Last Filled/Quantity: 08/15/18 40#

## 2018-10-13 ENCOUNTER — Other Ambulatory Visit: Payer: Self-pay | Admitting: Internal Medicine

## 2018-10-13 NOTE — Telephone Encounter (Signed)
Done erx 

## 2018-11-11 ENCOUNTER — Other Ambulatory Visit: Payer: Self-pay | Admitting: Internal Medicine

## 2018-11-13 NOTE — Telephone Encounter (Signed)
Done erx 

## 2018-11-23 IMAGING — DX DG HIP (WITH OR WITHOUT PELVIS) 2-3V*L*
3 series · 3 of 3 positions shown · non-contrast
Comparison: Intraoperative radiographs 09/09/2017

CLINICAL DATA: 61-year-old male with left hip pain. Patient is
status post recent hip surgery.

EXAM:
DG HIP (WITH OR WITHOUT PELVIS) 2-3V LEFT

[pelvis ap]
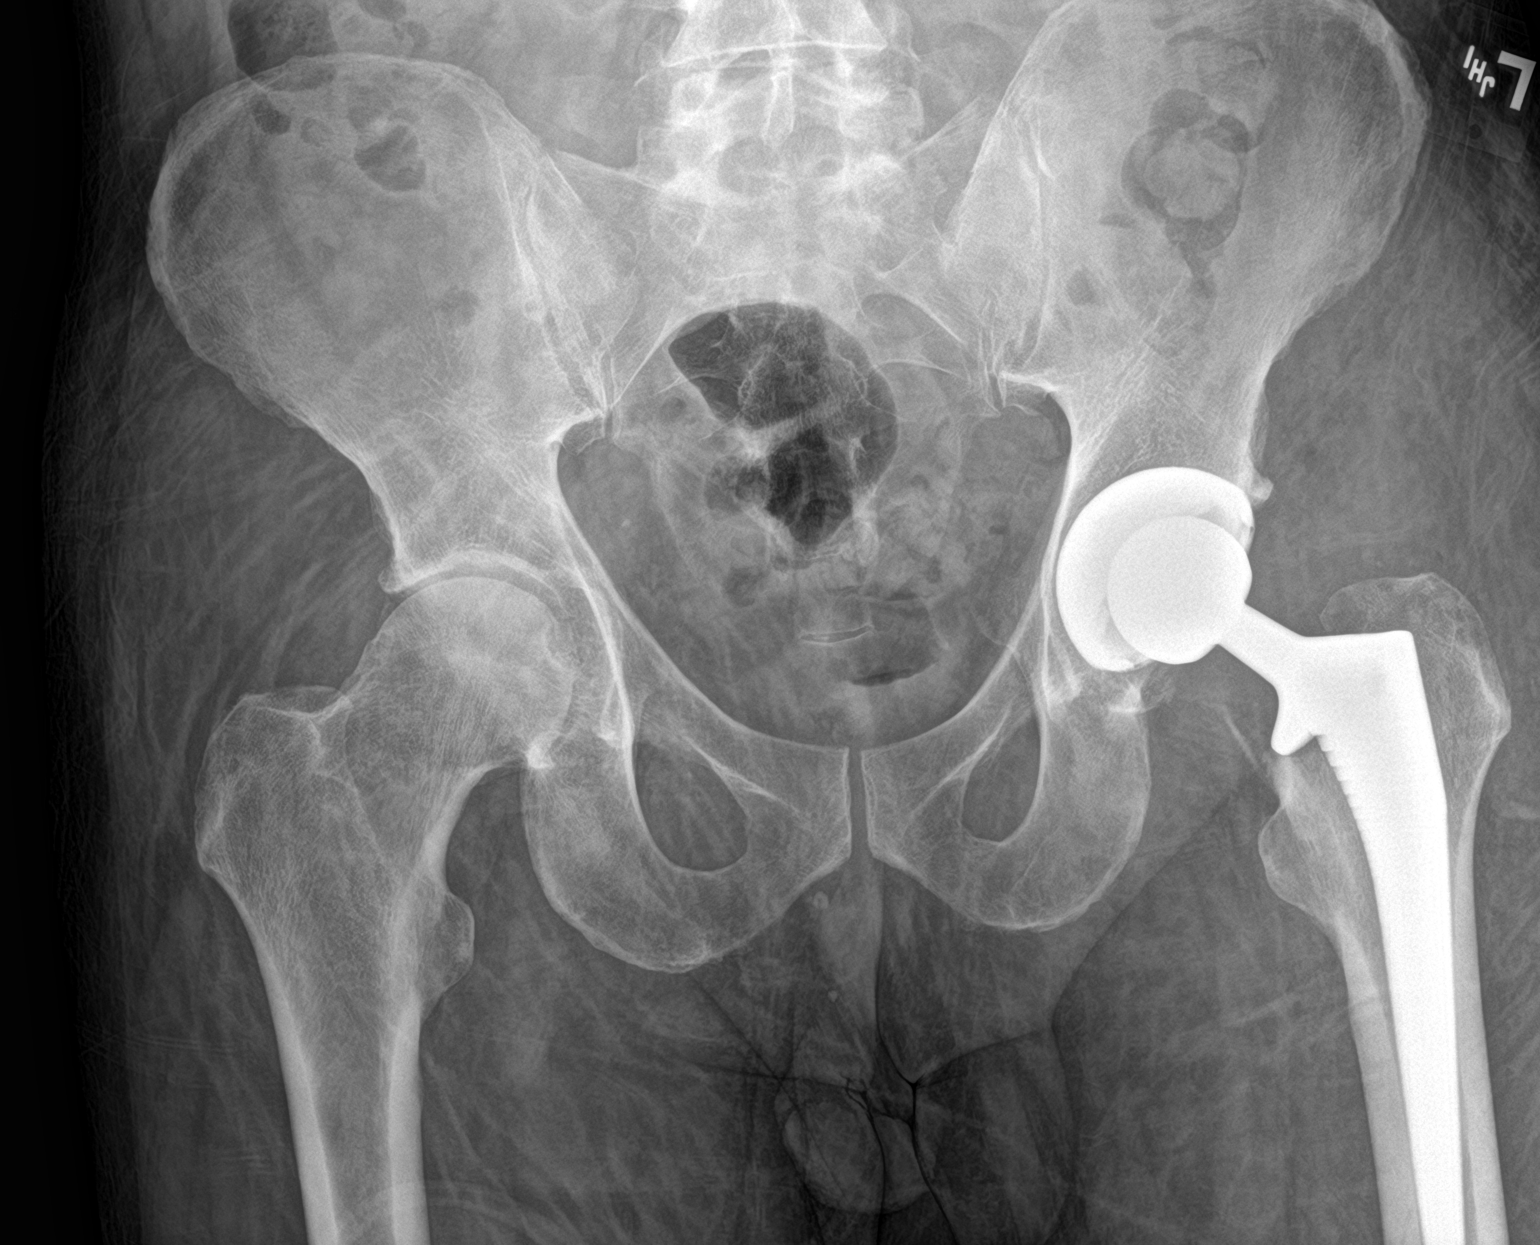

[hip ap]
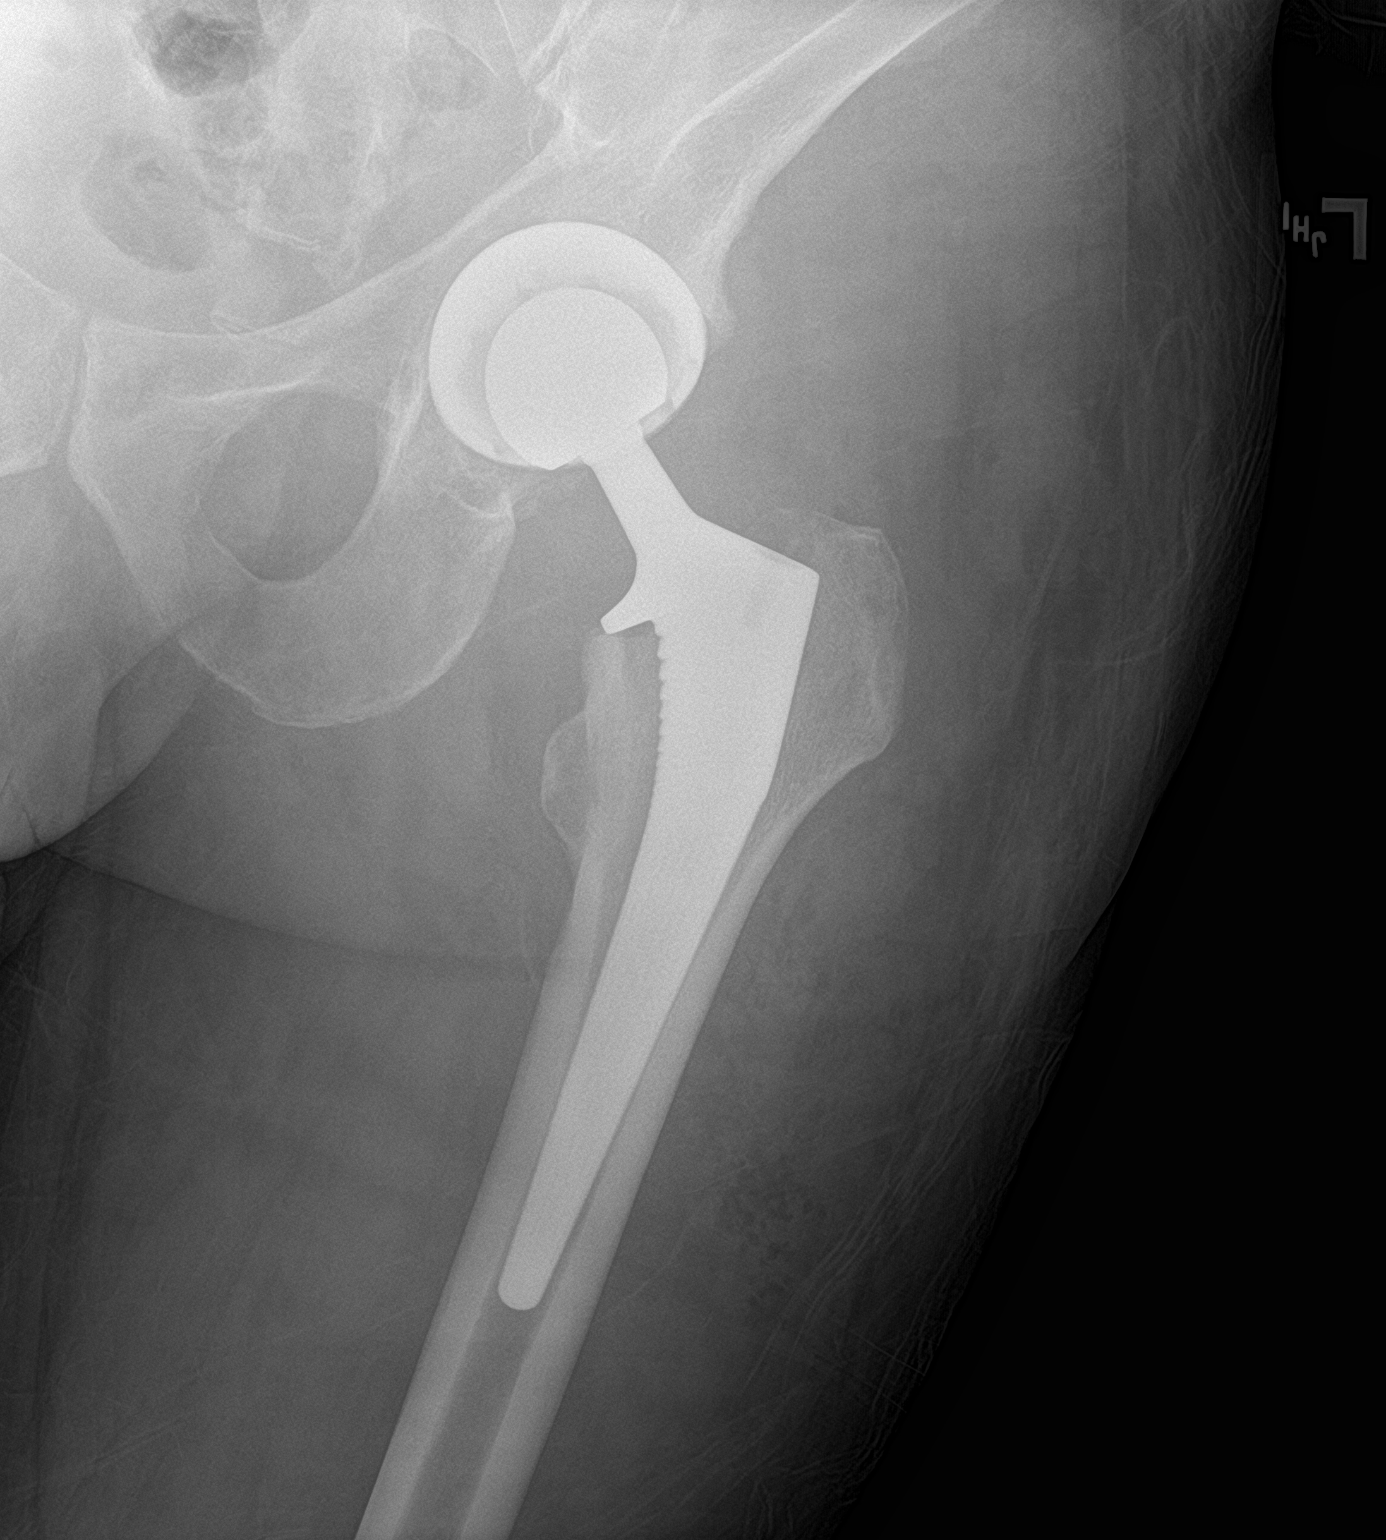

[hip x-table]
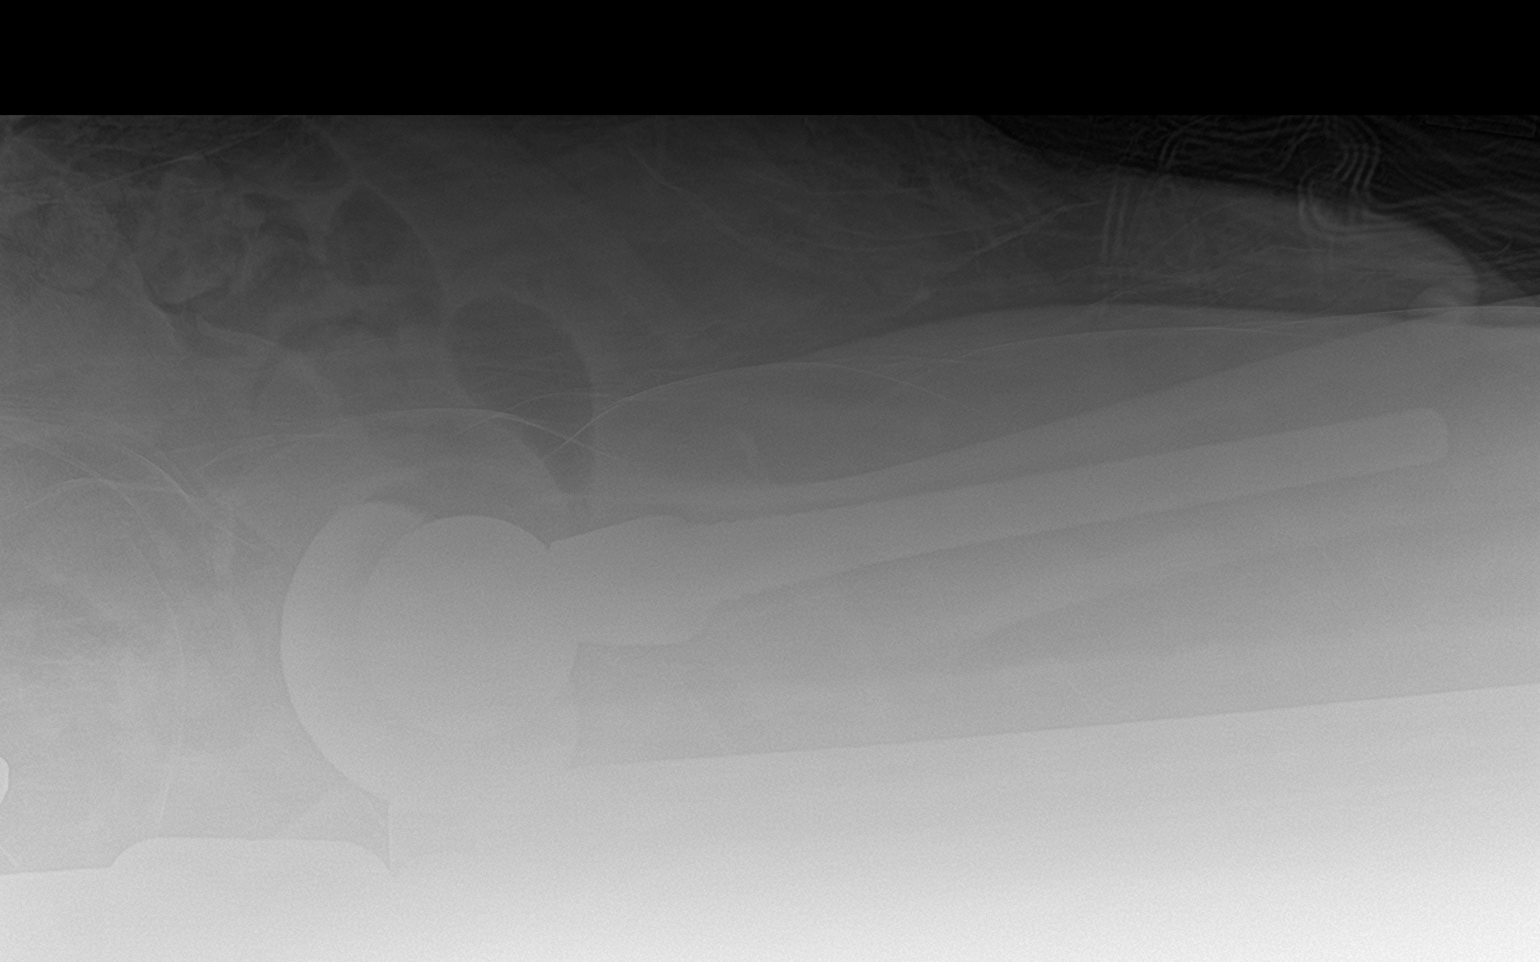

[3 of 3 positions shown; findings below may reference images not displayed]

FINDINGS: Surgical changes of left hip arthroplasty. The femoral head
component is located within the acetabular component. No evidence of
periprosthetic fracture or other acute complication. Mild right hip
joint osteoarthritis.
IMPRESSION: Surgical changes of left hip arthroplasty without evidence of
complication.

Mild right hip joint osteoarthritis.

## 2018-12-12 ENCOUNTER — Other Ambulatory Visit: Payer: Self-pay | Admitting: Internal Medicine

## 2018-12-12 NOTE — Telephone Encounter (Signed)
Done erx 

## 2018-12-25 DIAGNOSIS — L400 Psoriasis vulgaris: Secondary | ICD-10-CM | POA: Diagnosis not present

## 2019-01-11 ENCOUNTER — Other Ambulatory Visit: Payer: Self-pay | Admitting: Internal Medicine

## 2019-01-11 NOTE — Telephone Encounter (Signed)
Done erx 

## 2019-02-09 ENCOUNTER — Other Ambulatory Visit: Payer: Self-pay | Admitting: Internal Medicine

## 2019-02-09 NOTE — Telephone Encounter (Signed)
Done erx 

## 2019-03-12 ENCOUNTER — Other Ambulatory Visit: Payer: Self-pay | Admitting: Internal Medicine

## 2019-03-12 MED ORDER — OXYCODONE-ACETAMINOPHEN 5-325 MG PO TABS: ORAL_TABLET | ORAL | 0 refills | Status: DC

## 2019-03-12 NOTE — Telephone Encounter (Signed)
Done erx 

## 2019-03-29 ENCOUNTER — Other Ambulatory Visit: Payer: Self-pay | Admitting: Internal Medicine

## 2019-04-11 ENCOUNTER — Other Ambulatory Visit: Payer: Self-pay | Admitting: Internal Medicine

## 2019-04-11 NOTE — Telephone Encounter (Signed)
Done erx 

## 2019-05-10 ENCOUNTER — Other Ambulatory Visit: Payer: Self-pay | Admitting: Internal Medicine

## 2019-05-10 NOTE — Telephone Encounter (Signed)
Done erx 

## 2019-05-23 ENCOUNTER — Encounter: Payer: Self-pay | Admitting: Internal Medicine

## 2019-05-23 ENCOUNTER — Other Ambulatory Visit: Payer: Self-pay

## 2019-05-23 ENCOUNTER — Ambulatory Visit (INDEPENDENT_AMBULATORY_CARE_PROVIDER_SITE_OTHER): Payer: 59 | Admitting: Internal Medicine

## 2019-05-23 VITALS — BP 122/76 | HR 102 | Temp 97.9°F | Ht 74.0 in | Wt 204.0 lb

## 2019-05-23 DIAGNOSIS — E611 Iron deficiency: Secondary | ICD-10-CM | POA: Diagnosis not present

## 2019-05-23 DIAGNOSIS — E538 Deficiency of other specified B group vitamins: Secondary | ICD-10-CM | POA: Diagnosis not present

## 2019-05-23 DIAGNOSIS — Z23 Encounter for immunization: Secondary | ICD-10-CM

## 2019-05-23 DIAGNOSIS — Z0000 Encounter for general adult medical examination without abnormal findings: Secondary | ICD-10-CM | POA: Diagnosis not present

## 2019-05-23 DIAGNOSIS — E559 Vitamin D deficiency, unspecified: Secondary | ICD-10-CM

## 2019-05-23 DIAGNOSIS — R739 Hyperglycemia, unspecified: Secondary | ICD-10-CM

## 2019-05-23 MED ORDER — OXYCODONE-ACETAMINOPHEN 5-325 MG PO TABS: ORAL_TABLET | ORAL | 0 refills | Status: DC

## 2019-05-23 MED ORDER — ALPRAZOLAM 1 MG PO TABS: 1.0000 mg | ORAL_TABLET | Freq: Every evening | ORAL | 5 refills | Status: DC | PRN

## 2019-05-23 NOTE — Patient Instructions (Addendum)
You had the Tdap tetanus shot today  You will be contacted regarding the referral for: colonoscopy  Please continue all other medications as before, and refills have been done if requested.  Please have the pharmacy call with any other refills you may need.  Please continue your efforts at being more active, low cholesterol diet, and weight control.  You are otherwise up to date with prevention measures today.  Please keep your appointments with your specialists as you may have planned  Please go to the LAB in the Basement (turn left off the elevator) for the tests to be done today  You will be contacted by phone if any changes need to be made immediately.  Otherwise, you will receive a letter about your results with an explanation, but please check with MyChart first.  Please remember to sign up for MyChart if you have not done so, as this will be important to you in the future with finding out test results, communicating by private email, and scheduling acute appointments online when needed.  Please return in 1 year for your yearly visit, or sooner if needed, with Lab testing done 3-5 days before
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
## 2019-05-23 NOTE — Assessment & Plan Note (Signed)

## 2019-05-23 NOTE — Assessment & Plan Note (Signed)
stable overall by history and exam, recent data reviewed with pt, and pt to continue medical treatment as before,  to f/u any worsening symptoms or concerns  

## 2019-06-21 ENCOUNTER — Other Ambulatory Visit: Payer: Self-pay | Admitting: Internal Medicine

## 2019-06-21 NOTE — Telephone Encounter (Signed)
Done erx 

## 2019-07-20 ENCOUNTER — Other Ambulatory Visit: Payer: Self-pay | Admitting: Internal Medicine

## 2019-07-20 NOTE — Telephone Encounter (Signed)
Done erx 

## 2019-08-01 ENCOUNTER — Other Ambulatory Visit: Payer: 59

## 2019-08-17 ENCOUNTER — Other Ambulatory Visit: Payer: Self-pay | Admitting: Internal Medicine

## 2019-08-17 NOTE — Telephone Encounter (Signed)
Pt is going out of town tomorrow for 2 wks  and medication is three day early oxycodone. [facility].

## 2019-08-17 NOTE — Telephone Encounter (Signed)
Done erx 

## 2019-08-17 NOTE — Telephone Encounter (Signed)
Pt informed of below.  

## 2019-08-20 ENCOUNTER — Encounter: Payer: Self-pay | Admitting: Internal Medicine

## 2019-09-17 ENCOUNTER — Other Ambulatory Visit: Payer: Self-pay | Admitting: Internal Medicine

## 2019-09-18 NOTE — Telephone Encounter (Signed)
Done erx 

## 2019-10-22 ENCOUNTER — Other Ambulatory Visit: Payer: Self-pay | Admitting: Internal Medicine

## 2019-10-22 MED ORDER — OXYCODONE-ACETAMINOPHEN 5-325 MG PO TABS: 1.0000 | ORAL_TABLET | Freq: Two times a day (BID) | ORAL | 0 refills | Status: DC | PRN

## 2019-10-22 NOTE — Telephone Encounter (Signed)
Done erx 

## 2019-11-12 ENCOUNTER — Other Ambulatory Visit: Payer: Self-pay | Admitting: Internal Medicine

## 2019-11-12 MED ORDER — OXYCODONE-ACETAMINOPHEN 5-325 MG PO TABS: 1.0000 | ORAL_TABLET | Freq: Two times a day (BID) | ORAL | 0 refills | Status: DC | PRN

## 2019-11-12 NOTE — Telephone Encounter (Signed)
Done erx 

## 2019-11-13 NOTE — Telephone Encounter (Signed)
Already done dec 21

## 2019-12-13 ENCOUNTER — Other Ambulatory Visit: Payer: Self-pay | Admitting: Internal Medicine

## 2019-12-13 NOTE — Telephone Encounter (Signed)
Done erx 

## 2019-12-23 NOTE — Telephone Encounter (Signed)
It wont let me refuse it.  

## 2019-12-27 ENCOUNTER — Ambulatory Visit: Payer: 59 | Attending: Internal Medicine

## 2019-12-27 DIAGNOSIS — Z20822 Contact with and (suspected) exposure to covid-19: Secondary | ICD-10-CM

## 2019-12-28 ENCOUNTER — Ambulatory Visit: Payer: 59 | Admitting: Internal Medicine

## 2019-12-28 LAB — NOVEL CORONAVIRUS, NAA: SARS-CoV-2, NAA: NOT DETECTED

## 2020-01-01 ENCOUNTER — Other Ambulatory Visit (INDEPENDENT_AMBULATORY_CARE_PROVIDER_SITE_OTHER): Payer: 59

## 2020-01-01 DIAGNOSIS — E538 Deficiency of other specified B group vitamins: Secondary | ICD-10-CM | POA: Diagnosis not present

## 2020-01-01 DIAGNOSIS — Z0000 Encounter for general adult medical examination without abnormal findings: Secondary | ICD-10-CM | POA: Diagnosis not present

## 2020-01-01 DIAGNOSIS — E611 Iron deficiency: Secondary | ICD-10-CM

## 2020-01-01 DIAGNOSIS — E559 Vitamin D deficiency, unspecified: Secondary | ICD-10-CM

## 2020-01-01 DIAGNOSIS — R739 Hyperglycemia, unspecified: Secondary | ICD-10-CM | POA: Diagnosis not present

## 2020-01-01 DIAGNOSIS — Z125 Encounter for screening for malignant neoplasm of prostate: Secondary | ICD-10-CM | POA: Diagnosis not present

## 2020-01-01 LAB — CBC WITH DIFFERENTIAL/PLATELET
Basophils Absolute: 0 10*3/uL (ref 0.0–0.1)
Basophils Relative: 0.2 % (ref 0.0–3.0)
Eosinophils Absolute: 0.1 10*3/uL (ref 0.0–0.7)
Eosinophils Relative: 1.7 % (ref 0.0–5.0)
HCT: 47.5 % (ref 39.0–52.0)
Hemoglobin: 16.4 g/dL (ref 13.0–17.0)
Lymphocytes Relative: 42.8 % (ref 12.0–46.0)
Lymphs Abs: 2 10*3/uL (ref 0.7–4.0)
MCHC: 34.5 g/dL (ref 30.0–36.0)
MCV: 98.9 fl (ref 78.0–100.0)
Monocytes Absolute: 0.5 10*3/uL (ref 0.1–1.0)
Monocytes Relative: 9.5 % (ref 3.0–12.0)
Neutro Abs: 2.2 10*3/uL (ref 1.4–7.7)
Neutrophils Relative %: 45.8 % (ref 43.0–77.0)
Platelets: 199 10*3/uL (ref 150.0–400.0)
RBC: 4.8 Mil/uL (ref 4.22–5.81)
RDW: 13.6 % (ref 11.5–15.5)
WBC: 4.8 10*3/uL (ref 4.0–10.5)

## 2020-01-01 LAB — URINALYSIS, ROUTINE W REFLEX MICROSCOPIC
Bilirubin Urine: NEGATIVE
Hgb urine dipstick: NEGATIVE
Ketones, ur: NEGATIVE
Leukocytes,Ua: NEGATIVE
Nitrite: NEGATIVE
RBC / HPF: NONE SEEN (ref 0–?)
Specific Gravity, Urine: 1.01 (ref 1.000–1.030)
Total Protein, Urine: NEGATIVE
Urine Glucose: NEGATIVE
Urobilinogen, UA: 0.2 (ref 0.0–1.0)
pH: 5.5 (ref 5.0–8.0)

## 2020-01-01 LAB — HEPATIC FUNCTION PANEL
ALT: 110 U/L — ABNORMAL HIGH (ref 0–53)
AST: 94 U/L — ABNORMAL HIGH (ref 0–37)
Albumin: 4.2 g/dL (ref 3.5–5.2)
Alkaline Phosphatase: 97 U/L (ref 39–117)
Bilirubin, Direct: 0.2 mg/dL (ref 0.0–0.3)
Total Bilirubin: 0.7 mg/dL (ref 0.2–1.2)
Total Protein: 7.2 g/dL (ref 6.0–8.3)

## 2020-01-01 LAB — BASIC METABOLIC PANEL
BUN: 11 mg/dL (ref 6–23)
CO2: 26 mEq/L (ref 19–32)
Calcium: 9.5 mg/dL (ref 8.4–10.5)
Chloride: 103 mEq/L (ref 96–112)
Creatinine, Ser: 0.94 mg/dL (ref 0.40–1.50)
GFR: 81.15 mL/min (ref >60.00)
Glucose, Bld: 92 mg/dL (ref 70–99)
Potassium: 4.3 mEq/L (ref 3.5–5.1)
Sodium: 139 mEq/L (ref 135–145)

## 2020-01-01 LAB — PSA: PSA: 0.56 ng/mL (ref 0.10–4.00)

## 2020-01-01 LAB — TSH: TSH: 2.19 u[IU]/mL (ref 0.35–4.50)

## 2020-01-01 LAB — LIPID PANEL
Cholesterol: 201 mg/dL — ABNORMAL HIGH (ref 0–200)
HDL: 60.2 mg/dL (ref >39.00)
NonHDL: 140.72
Total CHOL/HDL Ratio: 3
Triglycerides: 214 mg/dL — ABNORMAL HIGH (ref 0.0–149.0)
VLDL: 42.8 mg/dL — ABNORMAL HIGH (ref 0.0–40.0)

## 2020-01-01 LAB — IBC PANEL
Iron: 166 ug/dL — ABNORMAL HIGH (ref 42–165)
Saturation Ratios: 52 % — ABNORMAL HIGH (ref 20.0–50.0)
Transferrin: 228 mg/dL (ref 212.0–360.0)

## 2020-01-01 LAB — VITAMIN B12: Vitamin B-12: 191 pg/mL — ABNORMAL LOW (ref 211–911)

## 2020-01-01 LAB — VITAMIN D 25 HYDROXY (VIT D DEFICIENCY, FRACTURES): VITD: 20.98 ng/mL — ABNORMAL LOW (ref 30.00–100.00)

## 2020-01-01 LAB — LDL CHOLESTEROL, DIRECT: Direct LDL: 97 mg/dL

## 2020-01-01 LAB — HEMOGLOBIN A1C: Hgb A1c MFr Bld: 5.4 % (ref 4.6–6.5)

## 2020-01-07 ENCOUNTER — Other Ambulatory Visit: Payer: Self-pay

## 2020-01-07 NOTE — Telephone Encounter (Signed)
error 

## 2020-01-11 ENCOUNTER — Ambulatory Visit: Payer: 59 | Admitting: Internal Medicine

## 2020-01-14 ENCOUNTER — Ambulatory Visit (INDEPENDENT_AMBULATORY_CARE_PROVIDER_SITE_OTHER): Payer: 59 | Admitting: Internal Medicine

## 2020-01-14 ENCOUNTER — Encounter: Payer: Self-pay | Admitting: Internal Medicine

## 2020-01-14 ENCOUNTER — Other Ambulatory Visit: Payer: Self-pay

## 2020-01-14 VITALS — BP 134/76 | HR 105 | Temp 98.1°F | Ht 74.0 in | Wt 205.0 lb

## 2020-01-14 DIAGNOSIS — R945 Abnormal results of liver function studies: Secondary | ICD-10-CM

## 2020-01-14 DIAGNOSIS — Z7289 Other problems related to lifestyle: Secondary | ICD-10-CM

## 2020-01-14 DIAGNOSIS — Z0001 Encounter for general adult medical examination with abnormal findings: Secondary | ICD-10-CM

## 2020-01-14 DIAGNOSIS — R1031 Right lower quadrant pain: Secondary | ICD-10-CM | POA: Diagnosis not present

## 2020-01-14 DIAGNOSIS — R739 Hyperglycemia, unspecified: Secondary | ICD-10-CM | POA: Diagnosis not present

## 2020-01-14 DIAGNOSIS — R7989 Other specified abnormal findings of blood chemistry: Secondary | ICD-10-CM

## 2020-01-14 DIAGNOSIS — L4050 Arthropathic psoriasis, unspecified: Secondary | ICD-10-CM

## 2020-01-14 DIAGNOSIS — I10 Essential (primary) hypertension: Secondary | ICD-10-CM

## 2020-01-14 DIAGNOSIS — Z0000 Encounter for general adult medical examination without abnormal findings: Secondary | ICD-10-CM

## 2020-01-14 DIAGNOSIS — Z789 Other specified health status: Secondary | ICD-10-CM

## 2020-01-14 DIAGNOSIS — F1090 Alcohol use, unspecified, uncomplicated: Secondary | ICD-10-CM

## 2020-01-14 MED ORDER — ALPRAZOLAM 1 MG PO TABS: 1.0000 mg | ORAL_TABLET | Freq: Every evening | ORAL | 5 refills | Status: DC | PRN

## 2020-01-14 MED ORDER — OXYCODONE-ACETAMINOPHEN 5-325 MG PO TABS: 1.0000 | ORAL_TABLET | Freq: Two times a day (BID) | ORAL | 0 refills | Status: DC | PRN

## 2020-01-14 MED ORDER — CYANOCOBALAMIN 1000 MCG/ML IJ SOLN
1000.0000 ug | Freq: Once | INTRAMUSCULAR | Status: AC
  Administered 2020-01-14: 1000 ug via INTRAMUSCULAR

## 2020-01-14 MED ORDER — VITAMIN D (ERGOCALCIFEROL) 1.25 MG (50000 UNIT) PO CAPS: 50000.0000 [IU] | ORAL_CAPSULE | ORAL | 0 refills | Status: DC

## 2020-01-14 NOTE — Assessment & Plan Note (Addendum)
Etiology unclear, declines CT abd/pelvis  I spent 40 minutes in addition to time for wellness examination in preparing to see the patient by review of recent labs, imaging and procedures, obtaining and reviewing separately obtained history, communicating with the patient and family or caregiver, ordering medications, tests or procedures, and documenting clinical information in the EHR including the differential Dx, treatment, and any further evaluation and other management of abd pain, psoriatic arthritis, abnormal lfts, ETOH use, hyperglycemia, HTN

## 2020-01-14 NOTE — Patient Instructions (Addendum)
You are signed up for the [facility] Vaccine Wait List; you should also sign up for the [facility] wait list at [email or web address]  You had the B12 shot today  Please take Vitamin D 50000 units weekly for 12 weeks, then plan to change to OTC Vitamin D3 at 2000 units per day, indefinitely.  Please also take the OTC B12 1 per day  Please continue all other medications as before, and refills have been done if requested.  Please have the pharmacy call with any other refills you may need.  Please continue your efforts at being more active, low cholesterol diet, and weight control.  You are otherwise up to date with prevention measures today.  Please keep your appointments with your specialists as you may have planned  Let us know if you change your mind about the CT scan  Please go to the LAB at the blood drawing area for the tests to be done  You will be contacted by phone if any changes need to be made immediately.  Otherwise, you will receive a letter about your results with an explanation, but please check with MyChart first.  Please remember to sign up for MyChart if you have not done so, as this will be important to you in the future with finding out test results, communicating by private email, and scheduling acute appointments online when needed.  Please make an Appointment to return for your 1 year visit, or sooner if needed

## 2020-01-14 NOTE — Assessment & Plan Note (Signed)
stable overall by history and exam, recent data reviewed with pt, and pt to continue medical treatment as before,  to f/u any worsening symptoms or concerns  

## 2020-01-14 NOTE — Progress Notes (Signed)
Subjective:    Patient ID: Randy Walker, male    DOB: Feb 17, 1956, 64 y.o.   MRN: [id number]  HPI Here for wellness and f/u;  Overall doing ok;  Pt denies Chest pain, worsening SOB, DOE, wheezing, orthopnea, PND, worsening LE edema, palpitations, dizziness or syncope.  Pt denies neurological change such as new headache, facial or extremity weakness.  Pt denies polydipsia, polyuria, or low sugar symptoms. Pt states overall good compliance with treatment and medications, good tolerability, and has been trying to follow appropriate diet.  Pt denies worsening depressive symptoms, suicidal ideation or panic. No fever, night sweats, wt loss, loss of appetite, or other constitutional symptoms.  Pt states good ability with ADL's, has low fall risk, home safety reviewed and adequate, no other significant changes in hearing or vision, and only occasionally active with exercise. Fully retired.   Wt Readings from Last 3 Encounters:  01/14/20 205 lb (93 kg)  05/23/19 204 lb (92.5 kg)  07/12/18 193 lb (87.5 kg)  Seeing ortho for right knee torn meniscus - no surgury for now.  Still jogs with the dogs some days.   Has gained wt with increased ETOH now about 5 beers per wk and 4 glasses wine per day.  Tested twice for covid - neg x 2 with last neg last wk.  Also with abd pain like an "alien grinding me up with a chainsaw", severe, intermittent, most mid right and lower right, no back pain except for chronic lbp, and Denies urinary symptoms such as dysuria, frequency, urgency, flank pain, hematuria or n/v, fever, chills. Denies worsening reflux, abd pain, dysphagia, n/v, bowel change or blood.  Did have recent elevated LFTs., and occas pain on left as well. Fortunately most pain is better now, mild intermittent, does not want CT due to cost Also asks for GOLD TB testing since his dermatologist recommended but pt did not go to the lab Past Medical History:  Diagnosis Date  . Adenomatous polyps 11/17/2011  . Alcohol  use 11/17/2011  . ALLERGIC RHINITIS 09/10/2007  . ANXIETY 09/10/2007  . BACK PAIN 12/31/2010  . [place] DISEASE, LUMBAR 12/31/2010  . GERD (gastroesophageal reflux disease) 11/23/2016  . Hyperglycemia 11/23/2016  . HYPERLIPIDEMIA 12/31/2010  . Hypertension   . Personal history of rectal adenomas 01/29/2008  . PSORIASIS 12/31/2010  . PULMONARY EMBOLISM, HX OF 09/10/2007  . RASH-NONVESICULAR 04/23/2009  . SMOKER 12/31/2010   Past Surgical History:  Procedure Laterality Date  . ROTATOR CUFF REPAIR  Jan, 2010   [facility], Left  . spleen repair    . 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 He has been smoking about 0.50 packs per day. He has never used smokeless tobacco. He reports current alcohol use. He reports that he does not use drugs. family history is not on file. No Known Allergies Current Outpatient Medications on File Prior to Visit  Medication Sig Dispense Refill  . amLODipine (NORVASC) 10 MG tablet TAKE 1 TABLET BY MOUTH ONCE DAILY 90 tablet 3  . aspirin EC 325 MG tablet Take 1 tablet (325 mg total) by mouth 2 (two) times daily after a meal. Take x 1 month post op to decrease risk of blood clots. 60 tablet 0  . cyclobenzaprine (FLEXERIL) 5 MG tablet TAKE 1 TABLET BY MOUTH 3 TIMES  A DAY AS NEEDED FOR MUSCLE SPASMS. 50 tablet 1  . losartan (COZAAR) 100 MG tablet TAKE 1 TABLET BY MOUTH ONCE DAILY 90 tablet 2  . pantoprazole (PROTONIX) 40 MG tablet TAKE 1 TABLET BY MOUTH ONCE A DAY 90 tablet 1   No current facility-administered medications on file prior to visit.   Review of Systems All otherwise neg per pt     Objective:   Physical Exam BP 134/76   Pulse (!) 105   Temp 98.1 F (36.7 C)   Ht 6\' 2"  (1.88 m)   Wt 205 lb (93 kg)   SpO2 98%   BMI 26.32 kg/m  VS noted,  Constitutional: Pt appears  in NAD HENT: Head: NCAT.  Right Ear: External ear normal.  Left Ear: External ear normal.  Eyes: . Pupils are equal, round, and reactive to light. Conjunctivae and EOM are normal Nose: without d/c or deformity Neck: Neck supple. Gross normal ROM Cardiovascular: Normal rate and regular rhythm.   Pulmonary/Chest: Effort normal and breath sounds without rales or wheezing.  Abd:  Soft,  ND, + BS, no organomegaly Neurological: Pt is alert. At baseline orientation, motor grossly intact Skin: Skin is warm. No rashes, other new lesions, no LE edema Psychiatric: Pt behavior is normal without agitation  Mild rlq tender All otherwise neg per pt Lab Results  Component Value Date   WBC 4.8 01/01/2020   HGB 16.4 01/01/2020   HCT 47.5 01/01/2020   PLT 199.0 01/01/2020   GLUCOSE 92 01/01/2020   CHOL 201 (H) 01/01/2020   TRIG 214.0 (H) 01/01/2020   HDL 60.20 01/01/2020   LDLDIRECT 97.0 01/01/2020   LDLCALC 139 (H) 09/26/2014   ALT 110 (H) 01/01/2020   AST 94 (H) 01/01/2020   NA 139 01/01/2020   K 4.3 01/01/2020   CL 103 01/01/2020   CREATININE 0.94 01/01/2020   BUN 11 01/01/2020   CO2 26 01/01/2020   TSH 2.19 01/01/2020   PSA 0.56 01/01/2020   INR 0.94 09/05/2017   HGBA1C 5.4 01/01/2020       Assessment & Plan:

## 2020-01-14 NOTE — Assessment & Plan Note (Signed)
For f/u rheum/derm, for GOLD test

## 2020-01-14 NOTE — Assessment & Plan Note (Signed)
Worsening, urged to abstain

## 2020-01-14 NOTE — Assessment & Plan Note (Signed)
Most likley realted to ETOH hepatitis, declines other repeat hepatitis testing or u/s

## 2020-01-14 NOTE — Assessment & Plan Note (Signed)

## 2020-01-15 LAB — HEPATIC FUNCTION PANEL
ALT: 98 U/L — ABNORMAL HIGH (ref 0–53)
AST: 63 U/L — ABNORMAL HIGH (ref 0–37)
Albumin: 4.4 g/dL (ref 3.5–5.2)
Alkaline Phosphatase: 91 U/L (ref 39–117)
Bilirubin, Direct: 0.1 mg/dL (ref 0.0–0.3)
Total Bilirubin: 0.5 mg/dL (ref 0.2–1.2)
Total Protein: 7.4 g/dL (ref 6.0–8.3)

## 2020-01-16 LAB — QUANTIFERON-TB GOLD PLUS
Mitogen-NIL: >10 IU/mL
NIL: 0.04 IU/mL
QuantiFERON-TB Gold Plus: NEGATIVE
TB1-NIL: 0 IU/mL
TB2-NIL: 0.01 IU/mL

## 2020-01-23 ENCOUNTER — Encounter: Payer: Self-pay | Admitting: Internal Medicine

## 2020-02-05 ENCOUNTER — Encounter: Payer: Self-pay | Admitting: Internal Medicine

## 2020-02-05 ENCOUNTER — Other Ambulatory Visit: Payer: Self-pay | Admitting: Internal Medicine

## 2020-02-05 MED ORDER — OXYCODONE-ACETAMINOPHEN 5-325 MG PO TABS: 1.0000 | ORAL_TABLET | Freq: Two times a day (BID) | ORAL | 0 refills | Status: DC | PRN

## 2020-02-05 NOTE — Telephone Encounter (Signed)
Done erx 

## 2020-03-07 ENCOUNTER — Other Ambulatory Visit: Payer: Self-pay | Admitting: Internal Medicine

## 2020-03-07 NOTE — Telephone Encounter (Signed)
Done erx 

## 2020-03-07 NOTE — Telephone Encounter (Signed)
Check [place] registry last filled 02/05/2020. Per office policy [id number] hours for refills. Will forward to MD desk for approval once he return on Monday.Marland KitchenJohny Chess

## 2020-04-07 ENCOUNTER — Other Ambulatory Visit: Payer: Self-pay | Admitting: Internal Medicine

## 2020-04-07 NOTE — Telephone Encounter (Signed)
Done erx 

## 2020-05-07 ENCOUNTER — Other Ambulatory Visit: Payer: Self-pay | Admitting: Internal Medicine

## 2020-05-07 NOTE — Telephone Encounter (Signed)
Done erx 

## 2020-05-27 ENCOUNTER — Telehealth: Payer: Self-pay | Admitting: Internal Medicine

## 2020-05-27 DIAGNOSIS — I10 Essential (primary) hypertension: Secondary | ICD-10-CM

## 2020-05-27 DIAGNOSIS — R739 Hyperglycemia, unspecified: Secondary | ICD-10-CM

## 2020-05-27 NOTE — Telephone Encounter (Signed)
New message:   Pt is calling and states he would like to get labs done before his appt tomorrow. Please advise and let pt know when orders have been placed.

## 2020-05-27 NOTE — Telephone Encounter (Signed)
Sent to Dr. John to advise. 

## 2020-05-27 NOTE — Telephone Encounter (Signed)
Spoke with pt and informed him his labs have been ordered. Pt states he will come in at 8am tomorrow 05/28/2020 to do his labs.

## 2020-05-27 NOTE — Telephone Encounter (Signed)
Ok labs ordered 

## 2020-05-28 ENCOUNTER — Encounter: Payer: Self-pay | Admitting: Internal Medicine

## 2020-05-28 ENCOUNTER — Ambulatory Visit (INDEPENDENT_AMBULATORY_CARE_PROVIDER_SITE_OTHER): Payer: 59 | Admitting: Internal Medicine

## 2020-05-28 ENCOUNTER — Other Ambulatory Visit: Payer: Self-pay

## 2020-05-28 ENCOUNTER — Other Ambulatory Visit: Payer: Self-pay | Admitting: Internal Medicine

## 2020-05-28 VITALS — BP 140/100 | HR 104 | Temp 98.6°F | Ht 74.0 in | Wt 206.0 lb

## 2020-05-28 DIAGNOSIS — R7989 Other specified abnormal findings of blood chemistry: Secondary | ICD-10-CM

## 2020-05-28 DIAGNOSIS — Z0000 Encounter for general adult medical examination without abnormal findings: Secondary | ICD-10-CM

## 2020-05-28 DIAGNOSIS — R739 Hyperglycemia, unspecified: Secondary | ICD-10-CM | POA: Diagnosis not present

## 2020-05-28 DIAGNOSIS — R945 Abnormal results of liver function studies: Secondary | ICD-10-CM | POA: Diagnosis not present

## 2020-05-28 DIAGNOSIS — I10 Essential (primary) hypertension: Secondary | ICD-10-CM | POA: Diagnosis not present

## 2020-05-28 DIAGNOSIS — L4050 Arthropathic psoriasis, unspecified: Secondary | ICD-10-CM | POA: Diagnosis not present

## 2020-05-28 DIAGNOSIS — E559 Vitamin D deficiency, unspecified: Secondary | ICD-10-CM

## 2020-05-28 DIAGNOSIS — E538 Deficiency of other specified B group vitamins: Secondary | ICD-10-CM

## 2020-05-28 LAB — LIPID PANEL
Cholesterol: 181 mg/dL (ref 0–200)
HDL: 64.4 mg/dL (ref >39.00)
LDL Cholesterol: 86 mg/dL (ref 0–99)
NonHDL: 116.83
Total CHOL/HDL Ratio: 3
Triglycerides: 156 mg/dL — ABNORMAL HIGH (ref 0.0–149.0)
VLDL: 31.2 mg/dL (ref 0.0–40.0)

## 2020-05-28 LAB — CBC WITH DIFFERENTIAL/PLATELET
Basophils Absolute: 0 10*3/uL (ref 0.0–0.1)
Basophils Relative: 1.2 % (ref 0.0–3.0)
Eosinophils Absolute: 0.1 10*3/uL (ref 0.0–0.7)
Eosinophils Relative: 3.3 % (ref 0.0–5.0)
HCT: 45.5 % (ref 39.0–52.0)
Hemoglobin: 15.9 g/dL (ref 13.0–17.0)
Lymphocytes Relative: 40.2 % (ref 12.0–46.0)
Lymphs Abs: 1.5 10*3/uL (ref 0.7–4.0)
MCHC: 35 g/dL (ref 30.0–36.0)
MCV: 100.4 fl — ABNORMAL HIGH (ref 78.0–100.0)
Monocytes Absolute: 0.4 10*3/uL (ref 0.1–1.0)
Monocytes Relative: 9.9 % (ref 3.0–12.0)
Neutro Abs: 1.7 10*3/uL (ref 1.4–7.7)
Neutrophils Relative %: 45.4 % (ref 43.0–77.0)
Platelets: 141 10*3/uL — ABNORMAL LOW (ref 150.0–400.0)
RBC: 4.53 Mil/uL (ref 4.22–5.81)
RDW: 13.8 % (ref 11.5–15.5)
WBC: 3.7 10*3/uL — ABNORMAL LOW (ref 4.0–10.5)

## 2020-05-28 LAB — HEPATIC FUNCTION PANEL
ALT: 84 U/L — ABNORMAL HIGH (ref 0–53)
AST: 79 U/L — ABNORMAL HIGH (ref 0–37)
Albumin: 4.2 g/dL (ref 3.5–5.2)
Alkaline Phosphatase: 88 U/L (ref 39–117)
Bilirubin, Direct: 0.2 mg/dL (ref 0.0–0.3)
Total Bilirubin: 0.6 mg/dL (ref 0.2–1.2)
Total Protein: 7 g/dL (ref 6.0–8.3)

## 2020-05-28 LAB — BASIC METABOLIC PANEL
BUN: 7 mg/dL (ref 6–23)
CO2: 28 mEq/L (ref 19–32)
Calcium: 9.3 mg/dL (ref 8.4–10.5)
Chloride: 99 mEq/L (ref 96–112)
Creatinine, Ser: 0.86 mg/dL (ref 0.40–1.50)
GFR: 89.8 mL/min (ref >60.00)
Glucose, Bld: 96 mg/dL (ref 70–99)
Potassium: 4.3 mEq/L (ref 3.5–5.1)
Sodium: 137 mEq/L (ref 135–145)

## 2020-05-28 LAB — HEMOGLOBIN A1C: Hgb A1c MFr Bld: 5.4 % (ref 4.6–6.5)

## 2020-05-28 MED ORDER — OXYCODONE-ACETAMINOPHEN 5-325 MG PO TABS: ORAL_TABLET | ORAL | 0 refills | Status: DC

## 2020-05-28 NOTE — Assessment & Plan Note (Addendum)
For f/u lab today, etiology unclear, to consider abd u/s and/or gi referral  I spent 31 minutes in preparing to see the patient by review of recent labs, imaging and procedures, obtaining and reviewing separately obtained history, communicating with the patient and family or caregiver, ordering medications, tests or procedures, and documenting clinical information in the EHR including the differential Dx, treatment, and any further evaluation and other management of elev lfts, psoriatic arthritis, hyperglycemia, htn

## 2020-05-28 NOTE — Assessment & Plan Note (Signed)
stable overall by history and exam, recent data reviewed with pt, and pt to continue medical treatment as before,  to f/u any worsening symptoms or concerns  

## 2020-05-28 NOTE — Progress Notes (Signed)
Subjective:    Patient ID: Randy Walker, male    DOB: June 02, 1956, 64 y.o.   MRN: [id number]  HPI  Here to f/u with worsening pain overall as humira stopped per dr [facility] due to elevated lFTS for unclear reason, Denies worsening reflux, abd pain, dysphagia, n/v, bowel change or blood, though drink etoh on daily basis.   Humira worked for psoriatic arthritis pain but came off due to eleated lfts.  Has been to 2 pain clinics in past and does not want to return for now. Pt denies chest pain, increased sob or doe, wheezing, orthopnea, PND, increased LE swelling, palpitations, dizziness or syncope.  Pt denies new neurological symptoms such as new headache, or facial or extremity weakness or numbness   Pt denies polydipsia, polyuria Past Medical History:  Diagnosis Date  . Adenomatous polyps 11/17/2011  . Alcohol use 11/17/2011  . ALLERGIC RHINITIS 09/10/2007  . ANXIETY 09/10/2007  . BACK PAIN 12/31/2010  . [place] DISEASE, LUMBAR 12/31/2010  . GERD (gastroesophageal reflux disease) 11/23/2016  . Hyperglycemia 11/23/2016  . HYPERLIPIDEMIA 12/31/2010  . Hypertension   . Personal history of rectal adenomas 01/29/2008  . PSORIASIS 12/31/2010  . PULMONARY EMBOLISM, HX OF 09/10/2007  . RASH-NONVESICULAR 04/23/2009  . SMOKER 12/31/2010   Past Surgical History:  Procedure Laterality Date  . ROTATOR CUFF REPAIR  Jan, 2010   [facility], Left  . spleen repair    . 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 He has been smoking about 0.50 packs per day. He has never used smokeless tobacco. He reports current alcohol use. He reports that he does not use drugs. family history is not on file. No Known Allergies Current Outpatient Medications on File Prior to Visit  Medication Sig Dispense Refill  . ALPRAZolam (XANAX) 1  MG tablet Take 1 tablet (1 mg total) by mouth at bedtime as needed. 30 tablet 5  . amLODipine (NORVASC) 10 MG tablet TAKE 1 TABLET BY MOUTH ONCE DAILY 90 tablet 3  . aspirin EC 325 MG tablet Take 1 tablet (325 mg total) by mouth 2 (two) times daily after a meal. Take x 1 month post op to decrease risk of blood clots. 60 tablet 0  . cyclobenzaprine (FLEXERIL) 5 MG tablet TAKE 1 TABLET BY MOUTH 3 TIMES A DAY AS NEEDED FOR MUSCLE SPASMS. 50 tablet 1  . HUMIRA PEN 40 MG/0.4ML PNKT SMARTSIG:40 Milligram(s) SUB-Q Every 2 Weeks    . losartan (COZAAR) 100 MG tablet TAKE 1 TABLET BY MOUTH ONCE DAILY 90 tablet 2  . pantoprazole (PROTONIX) 40 MG tablet TAKE 1 TABLET BY MOUTH ONCE A DAY 90 tablet 1  . Vitamin D, Ergocalciferol, (DRISDOL) 1.25 MG (50000 UNIT) CAPS capsule Take 1 capsule (50,000 Units total) by mouth every 7 (seven) days. 12 capsule 0   No current facility-administered medications on file prior to visit.   Review of Systems All otherwise neg per pt     Objective:   Physical Exam BP (!) 140/100 (BP Location: Left Arm, Patient Position: Sitting, Cuff Size: Large)   Pulse (!) 104   Temp 98.6 F (37 C) (Oral)   Ht 6\' 2"  (1.88 m)   Wt 206 lb (93.4 kg)   SpO2 95%   BMI 26.45 kg/m  VS noted,  Constitutional: Pt  appears in NAD HENT: Head: NCAT.  Right Ear: External ear normal.  Left Ear: External ear normal.  Eyes: . Pupils are equal, round, and reactive to light. Conjunctivae and EOM are normal Nose: without d/c or deformity Neck: Neck supple. Gross normal ROM Cardiovascular: Normal rate and regular rhythm.   Pulmonary/Chest: Effort normal and breath sounds without rales or wheezing.  Abd:  Soft, NT, ND, + BS, no organomegaly Neurological: Pt is alert. At baseline orientation, motor grossly intact Skin: Skin is warm. No rashes, other new lesions, no LE edema Psychiatric: Pt behavior is normal without agitation  All otherwise neg per pt Lab Results  Component Value Date   WBC  3.7 (L) 05/28/2020   HGB 15.9 05/28/2020   HCT 45.5 05/28/2020   PLT 141.0 (L) 05/28/2020   GLUCOSE 96 05/28/2020   CHOL 181 05/28/2020   TRIG 156.0 (H) 05/28/2020   HDL 64.40 05/28/2020   LDLDIRECT 97.0 01/01/2020   LDLCALC 86 05/28/2020   ALT 84 (H) 05/28/2020   AST 79 (H) 05/28/2020   NA 137 05/28/2020   K 4.3 05/28/2020   CL 99 05/28/2020   CREATININE 0.86 05/28/2020   BUN 7 05/28/2020   CO2 28 05/28/2020   TSH 2.19 01/01/2020   PSA 0.56 01/01/2020   INR 0.94 09/05/2017   HGBA1C 5.4 05/28/2020      Assessment & Plan:

## 2020-05-28 NOTE — Assessment & Plan Note (Signed)
Ok for referral other derm for second opinoin per pt request,  to f/u any worsening symptoms or concerns

## 2020-05-28 NOTE — Patient Instructions (Signed)
Please continue all other medications as before, and refills have been done if requested - pain medicine  Please have the pharmacy call with any other refills you may need.  Please continue your efforts at being more active, low cholesterol diet, and weight control..  Please keep your appointments with your specialists as you may have planned  You will be contacted regarding the referral for: Dermatology (new)  Your blood was drawn today  If the liver tests are elevated, we may need to do an Ultrasound  You will be contacted by phone if any changes need to be made immediately.  Otherwise, you will receive a letter about your results with an explanation, but please check with MyChart first.  Please remember to sign up for MyChart if you have not done so, as this will be important to you in the future with finding out test results, communicating by private email, and scheduling acute appointments online when needed.  Please make an Appointment to return in 6 months, or sooner if needed, also with Lab Appointment for testing done 3-5 days before at the [facility] (so this is for TWO appointments - please see the scheduling desk as you leave)

## 2020-05-30 ENCOUNTER — Encounter: Payer: Self-pay | Admitting: Internal Medicine

## 2020-06-05 ENCOUNTER — Other Ambulatory Visit: Payer: Self-pay

## 2020-06-05 ENCOUNTER — Ambulatory Visit
Admission: RE | Admit: 2020-06-05 | Discharge: 2020-06-05 | Disposition: A | Discharge status: Home / Self Care | Payer: 59 | Source: Ambulatory Visit | Attending: Internal Medicine | Admitting: Internal Medicine

## 2020-06-05 DIAGNOSIS — R945 Abnormal results of liver function studies: Secondary | ICD-10-CM

## 2020-06-05 DIAGNOSIS — R7989 Other specified abnormal findings of blood chemistry: Secondary | ICD-10-CM

## 2020-06-06 ENCOUNTER — Telehealth: Payer: Self-pay | Admitting: Internal Medicine

## 2020-06-06 ENCOUNTER — Telehealth: Payer: Self-pay

## 2020-06-06 DIAGNOSIS — N2889 Other specified disorders of kidney and ureter: Secondary | ICD-10-CM

## 2020-06-06 DIAGNOSIS — L4050 Arthropathic psoriasis, unspecified: Secondary | ICD-10-CM

## 2020-06-06 NOTE — Telephone Encounter (Signed)
IMPRESSION: 1. Solid 8.9 cm lower left renal mass with internal vascularity, suspicious for renal cell carcinoma. MRI abdomen without and with IV contrast recommended for further characterization. 2. Diffuse hepatic steatosis.  Will send above impression for Dr Jenny Reichmann for review; will also convey verbally.

## 2020-06-06 NOTE — Telephone Encounter (Signed)
See phone note

## 2020-06-06 NOTE — Telephone Encounter (Signed)
Spoke to pt by phone  U/s with 8.9 cm left lower renal mass, and fatty liver  Also states unable to dermatology for psoriatic arthritis tx with humira until November 2021; he is ok with referral to rheum  Plan: refer urology, and rheum as above  Cathlean Cower MD

## 2020-06-09 ENCOUNTER — Telehealth: Payer: Self-pay | Admitting: Internal Medicine

## 2020-06-09 NOTE — Telephone Encounter (Signed)
I have spoken to pt and last office notes have been sent to [facility]

## 2020-06-09 NOTE — Telephone Encounter (Signed)
New Message:   Pt is calling and states that he is needing office notes from his last visit that states he has a tumor the size of a softball in his kidney. He states Dr. Gloriann Loan is not on MyChart/Epic and is unable to see the notes. Please advise.

## 2020-06-19 ENCOUNTER — Ambulatory Visit (HOSPITAL_COMMUNITY)
Admission: RE | Admit: 2020-06-19 | Discharge: 2020-06-19 | Disposition: A | Discharge status: Home / Self Care | Payer: 59 | Source: Ambulatory Visit | Attending: Urology | Admitting: Urology

## 2020-06-19 ENCOUNTER — Other Ambulatory Visit (HOSPITAL_COMMUNITY): Payer: Self-pay | Admitting: Urology

## 2020-06-19 ENCOUNTER — Other Ambulatory Visit: Payer: Self-pay | Admitting: Internal Medicine

## 2020-06-19 ENCOUNTER — Other Ambulatory Visit: Payer: Self-pay

## 2020-06-19 DIAGNOSIS — D4102 Neoplasm of uncertain behavior of left kidney: Secondary | ICD-10-CM

## 2020-06-19 NOTE — Telephone Encounter (Signed)
Done erx 

## 2020-07-04 ENCOUNTER — Encounter: Payer: Self-pay | Admitting: Internal Medicine

## 2020-07-11 ENCOUNTER — Other Ambulatory Visit: Payer: Self-pay | Admitting: Urology

## 2020-07-16 ENCOUNTER — Other Ambulatory Visit: Payer: Self-pay | Admitting: Internal Medicine

## 2020-07-17 ENCOUNTER — Other Ambulatory Visit: Payer: Self-pay | Admitting: Internal Medicine

## 2020-07-18 MED ORDER — OXYCODONE-ACETAMINOPHEN 5-325 MG PO TABS
ORAL_TABLET | ORAL | 0 refills | Status: DC
Start: 2020-07-18 — End: 2020-08-14

## 2020-07-23 ENCOUNTER — Other Ambulatory Visit: Payer: Self-pay | Admitting: Internal Medicine

## 2020-07-23 NOTE — Telephone Encounter (Signed)
Done erx 

## 2020-07-30 ENCOUNTER — Ambulatory Visit: Payer: 59 | Admitting: Internal Medicine

## 2020-07-31 ENCOUNTER — Ambulatory Visit: Payer: 59 | Admitting: Internal Medicine

## 2020-08-14 ENCOUNTER — Other Ambulatory Visit: Payer: Self-pay | Admitting: Internal Medicine

## 2020-08-14 NOTE — Telephone Encounter (Signed)
Done erx 

## 2020-08-18 NOTE — Patient Instructions (Addendum)
DUE TO COVID-19 ONLY ONE VISITOR IS ALLOWED TO COME WITH YOU AND STAY IN THE WAITING ROOM ONLY  DURING PRE OP AND PROCEDURE.   IF YOU WILL BE ADMITTED INTO THE HOSPITAL YOU ARE ALLOWED ONE SUPPORT PERSON DURING  VISITATION HOURS ONLY (10AM -8PM)   . The support person may change daily. . The support person must pass our screening, gel in and out, and wear a mask at all times, including in the patient's room. . Patients must also wear a mask when staff or their support person are in the room.   COVID SWAB TESTING MUST BE COMPLETED ON:  Saturday, 08-23-20 @ 12:25 PM   [address] [place], [place] [address]  (Must self quarantine after testing. Follow instructions on  handout.)        Your procedure is scheduled on:  Wednesday, 08-27-20   Report to [facility] Main  Entrance     Report to admitting at 6:45 AM   Call this number if you have problems the morning of surgery [phone]           Magnesium Citrate Solution 1 Bottle.  Drink by noon the day before surgery   Do not eat food :After Midnight.   May have liquids until 5:45 AM  day of surgery  CLEAR LIQUID DIET  Foods Allowed                                                                     Foods Excluded  Water, Black Coffee and tea, regular and decaf             liquids that you cannot  Plain Jell-O in any flavor  (No red)                                   see through such as: Fruit ices (not with fruit pulp)                                      milk, soups, orange juice              Iced Popsicles (No red)                                      All solid food                                   Apple juices Sports drinks like Gatorade (No red) Lightly seasoned clear broth or consume(fat free)  Sugar, honey syrup   Oral Hygiene is also important to reduce your risk of infection.                                    Remember - BRUSH YOUR TEETH THE MORNING OF SURGERY WITH YOUR REGULAR TOOTHPASTE   Do NOT smoke  after Midnight  Take these medicines the morning of surgery with A SIP OF WATER:  Amlodipine, Pantoprazole                                You may not have any metal on your body including jewelry, and body piercings             Do not wear lotions, powders, perfumes/cologne, or deodorant             Men may shave face and neck.   Do not bring valuables to the hospital. Randy Walker.   Contacts, dentures or bridgework may not be worn into surgery.   Bring small overnight bag day of surgery.      Please read over the following fact sheets you were given: IF YOU HAVE QUESTIONS ABOUT YOUR PRE OP  INSTRUCTIONS PLEASE CALL [phone]    - Preparing for Surgery Before surgery, you can play an important role.  Because skin is not sterile, your skin needs to be as free of germs as possible.  You can reduce the number of germs on your skin by washing with CHG (chlorahexidine gluconate) soap before surgery.  CHG is an antiseptic cleaner which kills germs and bonds with the skin to continue killing germs even after washing. Please DO NOT use if you have an allergy to CHG or antibacterial soaps.  If your skin becomes reddened/irritated stop using the CHG and inform your nurse when you arrive at Short Stay. Do not shave (including legs and underarms) for at least 48 hours prior to the first CHG shower.  You may shave your face/neck.  Please follow these instructions carefully:  1.  Shower with CHG Soap the night before surgery and the  morning of surgery.  2.  If you choose to wash your hair, wash your hair first as usual with your normal  shampoo.  3.  After you shampoo, rinse your hair and body thoroughly to remove the shampoo.                             4.  Use CHG as you would any other liquid soap.  You can apply chg directly to the skin and wash.  Gently with a scrungie or clean washcloth.  5.  Apply the CHG Soap to your body ONLY FROM THE NECK  DOWN.   Do   not use on face/ open                           Wound or open sores. Avoid contact with eyes, ears mouth and   genitals (private parts).                       Wash face,  Genitals (private parts) with your normal soap.             6.  Wash thoroughly, paying special attention to the area where your    surgery  will be performed.  7.  Thoroughly rinse your body with warm water from the neck down.  8.  DO NOT shower/wash with your normal soap after using and rinsing off the CHG Soap.                9.  Pat yourself dry with  a clean towel.            10.  Wear clean pajamas.            11.  Place clean sheets on your bed the night of your first shower and do not  sleep with pets. Day of Surgery : Do not apply any lotions/deodorants the morning of surgery.  Please wear clean clothes to the hospital/surgery center.  FAILURE TO FOLLOW THESE INSTRUCTIONS MAY RESULT IN THE CANCELLATION OF YOUR SURGERY  PATIENT SIGNATURE_________________________________  NURSE SIGNATURE__________________________________  ________________________________________________________________________

## 2020-08-18 NOTE — Progress Notes (Addendum)
COVID Vaccine Completed: x2 Date COVID Vaccine completed: 2nd dose March 2021 COVID vaccine manufacturer: [place]   PCP - Cathlean Cower, MD Cardiologist - n/a  Chest x-ray - 06-19-20 in Epic EKG - 08-20-20 in Epic Stress Test -  ECHO -  Cardiac Cath -  Pacemaker/ICD device last checked:  Sleep Study -  CPAP -   Fasting Blood Sugar -  Checks Blood Sugar _____ times a day  Blood Thinner Instructions: Aspirin Instructions: Last Dose:  Anesthesia review:   Patient denies shortness of breath, fever, cough and chest pain at PAT appointment   Patient verbalized understanding of instructions that were given to them at the PAT appointment. Patient was also instructed that they will need to review over the PAT instructions again at home before surgery.

## 2020-08-20 ENCOUNTER — Encounter (HOSPITAL_COMMUNITY): Payer: Self-pay

## 2020-08-20 ENCOUNTER — Encounter (HOSPITAL_COMMUNITY)
Admission: RE | Admit: 2020-08-20 | Discharge: 2020-08-20 | Disposition: A | Discharge status: Home / Self Care | Payer: 59 | Source: Ambulatory Visit | Attending: Urology | Admitting: Urology

## 2020-08-20 ENCOUNTER — Other Ambulatory Visit: Payer: Self-pay

## 2020-08-20 DIAGNOSIS — I10 Essential (primary) hypertension: Secondary | ICD-10-CM | POA: Diagnosis not present

## 2020-08-20 DIAGNOSIS — Z01818 Encounter for other preprocedural examination: Secondary | ICD-10-CM | POA: Diagnosis present

## 2020-08-20 HISTORY — DX: Arthropathic psoriasis, unspecified: L40.50

## 2020-08-20 HISTORY — DX: Other specified disorders of kidney and ureter: N28.89

## 2020-08-20 LAB — BASIC METABOLIC PANEL
Anion gap: 9 (ref 5–15)
BUN: 11 mg/dL (ref 8–23)
CO2: 24 mmol/L (ref 22–32)
Calcium: 8.8 mg/dL — ABNORMAL LOW (ref 8.9–10.3)
Chloride: 105 mmol/L (ref 98–111)
Creatinine, Ser: 0.84 mg/dL (ref 0.61–1.24)
GFR calc Af Amer: >60 mL/min (ref >60)
GFR calc non Af Amer: >60 mL/min (ref >60)
Glucose, Bld: 101 mg/dL — ABNORMAL HIGH (ref 70–99)
Potassium: 4.2 mmol/L (ref 3.5–5.1)
Sodium: 138 mmol/L (ref 135–145)

## 2020-08-20 LAB — CBC
HCT: 46.6 % (ref 39.0–52.0)
Hemoglobin: 16.3 g/dL (ref 13.0–17.0)
MCH: 35.5 pg — ABNORMAL HIGH (ref 26.0–34.0)
MCHC: 35 g/dL (ref 30.0–36.0)
MCV: 101.5 fL — ABNORMAL HIGH (ref 80.0–100.0)
Platelets: 152 10*3/uL (ref 150–400)
RBC: 4.59 MIL/uL (ref 4.22–5.81)
RDW: 12.6 % (ref 11.5–15.5)
WBC: 6.1 10*3/uL (ref 4.0–10.5)
nRBC: 0 % (ref 0.0–0.2)

## 2020-08-23 ENCOUNTER — Other Ambulatory Visit (HOSPITAL_COMMUNITY)
Admission: RE | Admit: 2020-08-23 | Discharge: 2020-08-23 | Disposition: A | Discharge status: Home / Self Care | Payer: 59 | Source: Ambulatory Visit | Attending: Urology | Admitting: Urology

## 2020-08-23 DIAGNOSIS — Z20822 Contact with and (suspected) exposure to covid-19: Secondary | ICD-10-CM | POA: Diagnosis not present

## 2020-08-23 DIAGNOSIS — Z01812 Encounter for preprocedural laboratory examination: Secondary | ICD-10-CM | POA: Diagnosis present

## 2020-08-23 LAB — SARS CORONAVIRUS 2 (TAT 6-24 HRS): SARS Coronavirus 2: NEGATIVE

## 2020-08-27 ENCOUNTER — Inpatient Hospital Stay (HOSPITAL_COMMUNITY): Payer: 59 | Admitting: Physician Assistant

## 2020-08-27 ENCOUNTER — Inpatient Hospital Stay (HOSPITAL_COMMUNITY)
Admission: RE | Admit: 2020-08-27 | Discharge: 2020-08-29 | DRG: 658 | Disposition: A | Discharge status: Home / Self Care | Payer: 59 | Attending: Urology | Admitting: Urology

## 2020-08-27 ENCOUNTER — Encounter (HOSPITAL_COMMUNITY): Payer: Self-pay | Admitting: Urology

## 2020-08-27 ENCOUNTER — Encounter (HOSPITAL_COMMUNITY)
Admission: RE | Disposition: A | Discharge status: Home / Self Care | Payer: Self-pay | Source: Home / Self Care | Attending: Urology

## 2020-08-27 ENCOUNTER — Other Ambulatory Visit (HOSPITAL_COMMUNITY): Payer: Self-pay | Admitting: Physician Assistant

## 2020-08-27 ENCOUNTER — Other Ambulatory Visit: Payer: Self-pay

## 2020-08-27 ENCOUNTER — Inpatient Hospital Stay (HOSPITAL_COMMUNITY): Payer: 59 | Admitting: Anesthesiology

## 2020-08-27 DIAGNOSIS — E119 Type 2 diabetes mellitus without complications: Secondary | ICD-10-CM | POA: Diagnosis present

## 2020-08-27 DIAGNOSIS — E785 Hyperlipidemia, unspecified: Secondary | ICD-10-CM | POA: Diagnosis present

## 2020-08-27 DIAGNOSIS — I10 Essential (primary) hypertension: Secondary | ICD-10-CM | POA: Diagnosis present

## 2020-08-27 DIAGNOSIS — F17210 Nicotine dependence, cigarettes, uncomplicated: Secondary | ICD-10-CM | POA: Diagnosis present

## 2020-08-27 DIAGNOSIS — Z96642 Presence of left artificial hip joint: Secondary | ICD-10-CM | POA: Diagnosis present

## 2020-08-27 DIAGNOSIS — N2889 Other specified disorders of kidney and ureter: Secondary | ICD-10-CM | POA: Diagnosis present

## 2020-08-27 DIAGNOSIS — Z86711 Personal history of pulmonary embolism: Secondary | ICD-10-CM | POA: Diagnosis not present

## 2020-08-27 DIAGNOSIS — K219 Gastro-esophageal reflux disease without esophagitis: Secondary | ICD-10-CM | POA: Diagnosis present

## 2020-08-27 DIAGNOSIS — R590 Localized enlarged lymph nodes: Secondary | ICD-10-CM | POA: Diagnosis present

## 2020-08-27 DIAGNOSIS — C642 Malignant neoplasm of left kidney, except renal pelvis: Principal | ICD-10-CM | POA: Diagnosis present

## 2020-08-27 DIAGNOSIS — L4050 Arthropathic psoriasis, unspecified: Secondary | ICD-10-CM | POA: Diagnosis present

## 2020-08-27 LAB — TYPE AND SCREEN
ABO/RH(D): O POS
Antibody Screen: NEGATIVE

## 2020-08-27 LAB — HEMOGLOBIN AND HEMATOCRIT, BLOOD
HCT: 46.7 % (ref 39.0–52.0)
Hemoglobin: 16.1 g/dL (ref 13.0–17.0)

## 2020-08-27 SURGERY — NEPHRECTOMY, RADICAL, ROBOT-ASSISTED, LAPAROSCOPIC, ADULT
Anesthesia: General | Laterality: Left

## 2020-08-27 MED ORDER — BELLADONNA ALKALOIDS-OPIUM 16.2-60 MG RE SUPP
1.0000 | Freq: Four times a day (QID) | RECTAL | Status: DC | PRN
  Administered 2020-08-27: 1 via RECTAL
  Filled 2020-08-27: qty 1

## 2020-08-27 MED ORDER — LIDOCAINE 2% (20 MG/ML) 5 ML SYRINGE
INTRAMUSCULAR | Status: AC
  Filled 2020-08-27: qty 5

## 2020-08-27 MED ORDER — DOCUSATE SODIUM 100 MG PO CAPS
100.0000 mg | ORAL_CAPSULE | Freq: Two times a day (BID) | ORAL | Status: DC
  Administered 2020-08-27 – 2020-08-29 (×4): 100 mg via ORAL
  Filled 2020-08-27 (×4): qty 1

## 2020-08-27 MED ORDER — ROCURONIUM BROMIDE 10 MG/ML (PF) SYRINGE
PREFILLED_SYRINGE | INTRAVENOUS | Status: AC
  Filled 2020-08-27: qty 10

## 2020-08-27 MED ORDER — KETAMINE HCL 10 MG/ML IJ SOLN
INTRAMUSCULAR | Status: DC | PRN
  Administered 2020-08-27: 20 mg via INTRAVENOUS
  Administered 2020-08-27: 10 mg via INTRAVENOUS
  Administered 2020-08-27: 20 mg via INTRAVENOUS

## 2020-08-27 MED ORDER — ACETAMINOPHEN 10 MG/ML IV SOLN
INTRAVENOUS | Status: AC
  Filled 2020-08-27: qty 100

## 2020-08-27 MED ORDER — ONDANSETRON HCL 4 MG/2ML IJ SOLN: 4.0000 mg | INTRAMUSCULAR | Status: DC | PRN

## 2020-08-27 MED ORDER — SUFENTANIL CITRATE 50 MCG/ML IV SOLN
INTRAVENOUS | Status: DC | PRN
Start: 2020-08-27 — End: 2020-08-27
  Administered 2020-08-27 (×2): 10 ug via INTRAVENOUS
  Administered 2020-08-27: 5 ug via INTRAVENOUS
  Administered 2020-08-27: 10 ug via INTRAVENOUS
  Administered 2020-08-27: 5 ug via INTRAVENOUS
  Administered 2020-08-27 (×5): 10 ug via INTRAVENOUS

## 2020-08-27 MED ORDER — KETAMINE HCL 10 MG/ML IJ SOLN
INTRAMUSCULAR | Status: AC
  Filled 2020-08-27: qty 1

## 2020-08-27 MED ORDER — DIPHENHYDRAMINE HCL 50 MG/ML IJ SOLN: 12.5000 mg | Freq: Four times a day (QID) | INTRAMUSCULAR | Status: DC | PRN

## 2020-08-27 MED ORDER — OXYCODONE HCL 5 MG PO TABS
5.0000 mg | ORAL_TABLET | ORAL | Status: DC | PRN
  Administered 2020-08-27: 5 mg via ORAL
  Filled 2020-08-27: qty 1

## 2020-08-27 MED ORDER — SUFENTANIL CITRATE 50 MCG/ML IV SOLN
INTRAVENOUS | Status: AC
  Filled 2020-08-27: qty 1

## 2020-08-27 MED ORDER — ROCURONIUM BROMIDE 10 MG/ML (PF) SYRINGE
PREFILLED_SYRINGE | INTRAVENOUS | Status: DC | PRN
  Administered 2020-08-27 (×2): 20 mg via INTRAVENOUS
  Administered 2020-08-27: 100 mg via INTRAVENOUS

## 2020-08-27 MED ORDER — LACTATED RINGERS IV SOLN: INTRAVENOUS | Status: DC | PRN

## 2020-08-27 MED ORDER — HYDROMORPHONE HCL 1 MG/ML IJ SOLN
0.2500 mg | INTRAMUSCULAR | Status: DC | PRN
  Administered 2020-08-27 (×4): 0.5 mg via INTRAVENOUS

## 2020-08-27 MED ORDER — PANTOPRAZOLE SODIUM 40 MG PO TBEC
40.0000 mg | DELAYED_RELEASE_TABLET | Freq: Every day | ORAL | Status: DC
  Administered 2020-08-27 – 2020-08-29 (×3): 40 mg via ORAL
  Filled 2020-08-27 (×3): qty 1

## 2020-08-27 MED ORDER — MEPERIDINE HCL 50 MG/ML IJ SOLN: 6.2500 mg | INTRAMUSCULAR | Status: DC | PRN

## 2020-08-27 MED ORDER — ALPRAZOLAM 1 MG PO TABS
1.0000 mg | ORAL_TABLET | Freq: Every evening | ORAL | Status: DC | PRN
  Administered 2020-08-27 – 2020-08-28 (×2): 1 mg via ORAL
  Filled 2020-08-27 (×2): qty 1

## 2020-08-27 MED ORDER — HYDROMORPHONE HCL 1 MG/ML IJ SOLN
0.5000 mg | INTRAMUSCULAR | Status: DC | PRN
  Administered 2020-08-27 (×2): 1 mg via INTRAVENOUS
  Administered 2020-08-27: 0.5 mg via INTRAVENOUS
  Administered 2020-08-28: 1 mg via INTRAVENOUS
  Filled 2020-08-27 (×5): qty 1

## 2020-08-27 MED ORDER — DEXAMETHASONE SODIUM PHOSPHATE 10 MG/ML IJ SOLN
INTRAMUSCULAR | Status: AC
  Filled 2020-08-27: qty 1

## 2020-08-27 MED ORDER — ONDANSETRON HCL 4 MG/2ML IJ SOLN
INTRAMUSCULAR | Status: DC | PRN
  Administered 2020-08-27: 4 mg via INTRAVENOUS

## 2020-08-27 MED ORDER — LACTATED RINGERS IV SOLN: INTRAVENOUS | Status: DC

## 2020-08-27 MED ORDER — PHENYLEPHRINE 40 MCG/ML (10ML) SYRINGE FOR IV PUSH (FOR BLOOD PRESSURE SUPPORT)
PREFILLED_SYRINGE | INTRAVENOUS | Status: AC
  Filled 2020-08-27: qty 10

## 2020-08-27 MED ORDER — BACITRACIN-NEOMYCIN-POLYMYXIN 400-5-5000 EX OINT: 1.0000 "application " | TOPICAL_OINTMENT | Freq: Three times a day (TID) | CUTANEOUS | Status: DC | PRN

## 2020-08-27 MED ORDER — DEXTROSE-NACL 5-0.45 % IV SOLN: INTRAVENOUS | Status: DC

## 2020-08-27 MED ORDER — MIDAZOLAM HCL 2 MG/2ML IJ SOLN
INTRAMUSCULAR | Status: DC | PRN
  Administered 2020-08-27: 2 mg via INTRAVENOUS

## 2020-08-27 MED ORDER — STERILE WATER FOR IRRIGATION IR SOLN
Status: DC | PRN
  Administered 2020-08-27: 1000 mL

## 2020-08-27 MED ORDER — ACETAMINOPHEN 10 MG/ML IV SOLN
1000.0000 mg | Freq: Once | INTRAVENOUS | Status: DC | PRN
  Administered 2020-08-27: 1000 mg via INTRAVENOUS

## 2020-08-27 MED ORDER — HYDROMORPHONE HCL 1 MG/ML IJ SOLN
INTRAMUSCULAR | Status: AC
Start: 2020-08-27 — End: 2020-08-28
  Filled 2020-08-27: qty 2

## 2020-08-27 MED ORDER — LACTATED RINGERS IR SOLN
Status: DC | PRN
  Administered 2020-08-27: 1000 mL

## 2020-08-27 MED ORDER — SODIUM CHLORIDE (PF) 0.9 % IJ SOLN
INTRAMUSCULAR | Status: AC
  Filled 2020-08-27: qty 20

## 2020-08-27 MED ORDER — ACETAMINOPHEN 500 MG PO TABS
1000.0000 mg | ORAL_TABLET | Freq: Four times a day (QID) | ORAL | Status: AC
  Administered 2020-08-27 – 2020-08-28 (×3): 1000 mg via ORAL
  Filled 2020-08-27 (×4): qty 2

## 2020-08-27 MED ORDER — ORAL CARE MOUTH RINSE: 15.0000 mL | Freq: Once | OROMUCOSAL | Status: AC

## 2020-08-27 MED ORDER — CHLORHEXIDINE GLUCONATE 0.12 % MT SOLN
15.0000 mL | Freq: Once | OROMUCOSAL | Status: AC
  Administered 2020-08-27: 15 mL via OROMUCOSAL

## 2020-08-27 MED ORDER — DEXAMETHASONE SODIUM PHOSPHATE 10 MG/ML IJ SOLN
INTRAMUSCULAR | Status: DC | PRN
  Administered 2020-08-27: 10 mg via INTRAVENOUS

## 2020-08-27 MED ORDER — ONDANSETRON HCL 4 MG/2ML IJ SOLN
INTRAMUSCULAR | Status: AC
  Filled 2020-08-27: qty 2

## 2020-08-27 MED ORDER — LOSARTAN POTASSIUM 50 MG PO TABS
100.0000 mg | ORAL_TABLET | Freq: Every day | ORAL | Status: DC
  Administered 2020-08-28 – 2020-08-29 (×2): 100 mg via ORAL
  Filled 2020-08-27 (×2): qty 2

## 2020-08-27 MED ORDER — AMISULPRIDE (ANTIEMETIC) 5 MG/2ML IV SOLN: 10.0000 mg | Freq: Once | INTRAVENOUS | Status: DC | PRN

## 2020-08-27 MED ORDER — PHENYLEPHRINE 40 MCG/ML (10ML) SYRINGE FOR IV PUSH (FOR BLOOD PRESSURE SUPPORT)
PREFILLED_SYRINGE | INTRAVENOUS | Status: DC | PRN
  Administered 2020-08-27: 120 ug via INTRAVENOUS

## 2020-08-27 MED ORDER — CHLORHEXIDINE GLUCONATE CLOTH 2 % EX PADS: 6.0000 | MEDICATED_PAD | Freq: Every day | CUTANEOUS | Status: DC

## 2020-08-27 MED ORDER — DIPHENHYDRAMINE HCL 12.5 MG/5ML PO ELIX: 12.5000 mg | ORAL_SOLUTION | Freq: Four times a day (QID) | ORAL | Status: DC | PRN

## 2020-08-27 MED ORDER — MAGNESIUM CITRATE PO SOLN: 1.0000 | Freq: Once | ORAL | Status: DC

## 2020-08-27 MED ORDER — LIDOCAINE 2% (20 MG/ML) 5 ML SYRINGE
INTRAMUSCULAR | Status: DC | PRN
  Administered 2020-08-27: 100 mg via INTRAVENOUS

## 2020-08-27 MED ORDER — SUGAMMADEX SODIUM 200 MG/2ML IV SOLN
INTRAVENOUS | Status: DC | PRN
  Administered 2020-08-27: 200 mg via INTRAVENOUS

## 2020-08-27 MED ORDER — OXYCODONE-ACETAMINOPHEN 5-325 MG PO TABS: 1.0000 | ORAL_TABLET | Freq: Four times a day (QID) | ORAL | 0 refills | Status: DC | PRN

## 2020-08-27 MED ORDER — SODIUM CHLORIDE (PF) 0.9 % IJ SOLN
INTRAMUSCULAR | Status: AC
  Filled 2020-08-27: qty 10

## 2020-08-27 MED ORDER — BUPIVACAINE LIPOSOME 1.3 % IJ SUSP
20.0000 mL | Freq: Once | INTRAMUSCULAR | Status: AC
  Administered 2020-08-27: 40 mL
  Filled 2020-08-27: qty 20

## 2020-08-27 MED ORDER — OXYCODONE HCL 5 MG PO TABS
10.0000 mg | ORAL_TABLET | ORAL | Status: DC | PRN
  Administered 2020-08-27 – 2020-08-29 (×9): 10 mg via ORAL
  Filled 2020-08-27 (×9): qty 2

## 2020-08-27 MED ORDER — MIDAZOLAM HCL 2 MG/2ML IJ SOLN
INTRAMUSCULAR | Status: AC
  Filled 2020-08-27: qty 2

## 2020-08-27 MED ORDER — AMLODIPINE BESYLATE 10 MG PO TABS
10.0000 mg | ORAL_TABLET | Freq: Every day | ORAL | Status: DC
  Administered 2020-08-28 – 2020-08-29 (×2): 10 mg via ORAL
  Filled 2020-08-27 (×2): qty 1

## 2020-08-27 MED ORDER — PROPOFOL 10 MG/ML IV BOLUS
INTRAVENOUS | Status: DC | PRN
  Administered 2020-08-27: 160 mg via INTRAVENOUS

## 2020-08-27 MED ORDER — ACETAMINOPHEN 160 MG/5ML PO SOLN: 325.0000 mg | Freq: Once | ORAL | Status: DC | PRN

## 2020-08-27 MED ORDER — CEFAZOLIN SODIUM-DEXTROSE 2-4 GM/100ML-% IV SOLN
2.0000 g | INTRAVENOUS | Status: AC
  Administered 2020-08-27: 2 g via INTRAVENOUS
  Filled 2020-08-27: qty 100

## 2020-08-27 MED ORDER — PROPOFOL 10 MG/ML IV BOLUS
INTRAVENOUS | Status: AC
  Filled 2020-08-27: qty 20

## 2020-08-27 MED ORDER — ACETAMINOPHEN 325 MG PO TABS: 325.0000 mg | ORAL_TABLET | Freq: Once | ORAL | Status: DC | PRN

## 2020-08-27 MED FILL — OXYCODONE-APAP 5-325MG: 5-325 | 4 days supply | Qty: 20 | Fill #0

## 2020-08-27 SURGICAL SUPPLY — 60 items
ADH SKN CLS APL DERMABOND .7 (GAUZE/BANDAGES/DRESSINGS) ×4
APL PRP STRL LF DISP 70% ISPRP (MISCELLANEOUS) ×2
BAG LAPAROSCOPIC 12 15 PORT 16 (BASKET) ×2 IMPLANT
BAG RETRIEVAL 12/15 (BASKET) ×3
CHLORAPREP W/TINT 26 (MISCELLANEOUS) ×3 IMPLANT
CLIP VESOLOCK LG 6/CT PURPLE (CLIP) ×5 IMPLANT
CLIP VESOLOCK MED LG 6/CT (CLIP) ×3 IMPLANT
CLIP VESOLOCK XL 6/CT (CLIP) ×4 IMPLANT
COVER SURGICAL LIGHT HANDLE (MISCELLANEOUS) ×3 IMPLANT
COVER TIP SHEARS 8 DVNC (MISCELLANEOUS) ×2 IMPLANT
COVER TIP SHEARS 8MM DA VINCI (MISCELLANEOUS) ×3
COVER WAND RF STERILE (DRAPES) ×3 IMPLANT
CUTTER ECHEON FLEX ENDO 45 340 (ENDOMECHANICALS) IMPLANT
DECANTER SPIKE VIAL GLASS SM (MISCELLANEOUS) ×3 IMPLANT
DERMABOND ADVANCED (GAUZE/BANDAGES/DRESSINGS) ×2
DERMABOND ADVANCED .7 DNX12 (GAUZE/BANDAGES/DRESSINGS) ×4 IMPLANT
DRAIN CHANNEL 15F RND FF 3/16 (WOUND CARE) IMPLANT
DRAPE ARM DVNC X/XI (DISPOSABLE) ×8 IMPLANT
DRAPE COLUMN DVNC XI (DISPOSABLE) ×2 IMPLANT
DRAPE DA VINCI XI ARM (DISPOSABLE) ×12
DRAPE DA VINCI XI COLUMN (DISPOSABLE) ×3
DRAPE INCISE IOBAN 66X45 STRL (DRAPES) ×3 IMPLANT
DRAPE SHEET LG 3/4 BI-LAMINATE (DRAPES) ×3 IMPLANT
ELECT PENCIL ROCKER SW 15FT (MISCELLANEOUS) ×3 IMPLANT
ELECT REM PT RETURN 15FT ADLT (MISCELLANEOUS) ×3 IMPLANT
EVACUATOR SILICONE 100CC (DRAIN) IMPLANT
GLOVE BIO SURGEON STRL SZ 6.5 (GLOVE) ×3 IMPLANT
GLOVE BIOGEL M STRL SZ7.5 (GLOVE) ×6 IMPLANT
GOWN STRL REUS W/TWL LRG LVL3 (GOWN DISPOSABLE) ×9 IMPLANT
IRRIG SUCT STRYKERFLOW 2 WTIP (MISCELLANEOUS) ×3
IRRIGATION SUCT STRKRFLW 2 WTP (MISCELLANEOUS) ×2 IMPLANT
KIT BASIN OR (CUSTOM PROCEDURE TRAY) ×3 IMPLANT
KIT TURNOVER KIT A (KITS) IMPLANT
LOOP VESSEL MAXI BLUE (MISCELLANEOUS) IMPLANT
NDL INSUFFLATION 14GA 120MM (NEEDLE) ×1 IMPLANT
NEEDLE INSUFFLATION 14GA 120MM (NEEDLE) ×3 IMPLANT
PENCIL SMOKE EVACUATOR (MISCELLANEOUS) IMPLANT
PORT ACCESS TROCAR AIRSEAL 12 (TROCAR) ×2 IMPLANT
PORT ACCESS TROCAR AIRSEAL 5M (TROCAR) ×1
PROTECTOR NERVE ULNAR (MISCELLANEOUS) ×6 IMPLANT
RELOAD STAPLE 45 2.6 WHT THIN (STAPLE) IMPLANT
SEAL CANN UNIV 5-8 DVNC XI (MISCELLANEOUS) ×8 IMPLANT
SEAL XI 5MM-8MM UNIVERSAL (MISCELLANEOUS) ×12
SET TRI-LUMEN FLTR TB AIRSEAL (TUBING) ×3 IMPLANT
SOLUTION ELECTROLUBE (MISCELLANEOUS) ×3 IMPLANT
SPONGE LAP 4X18 RFD (DISPOSABLE) ×3 IMPLANT
STAPLE RELOAD 45 WHT (STAPLE) ×10 IMPLANT
STAPLE RELOAD 45MM WHITE (STAPLE) ×15
SUT ETHILON 3 0 PS 1 (SUTURE) IMPLANT
SUT MNCRL AB 4-0 PS2 18 (SUTURE) ×6 IMPLANT
SUT PDS AB 1 CT1 27 (SUTURE) ×9 IMPLANT
SUT VICRYL 0 UR6 27IN ABS (SUTURE) IMPLANT
TOWEL OR 17X26 10 PK STRL BLUE (TOWEL DISPOSABLE) ×3 IMPLANT
TOWEL OR NON WOVEN STRL DISP B (DISPOSABLE) ×3 IMPLANT
TRAY FOLEY MTR SLVR 16FR STAT (SET/KITS/TRAYS/PACK) ×3 IMPLANT
TRAY LAPAROSCOPIC (CUSTOM PROCEDURE TRAY) ×3 IMPLANT
TROCAR BLADELESS OPT 5 100 (ENDOMECHANICALS) IMPLANT
TROCAR UNIVERSAL OPT 12M 100M (ENDOMECHANICALS) ×3 IMPLANT
TROCAR XCEL 12X100 BLDLESS (ENDOMECHANICALS) ×3 IMPLANT
WATER STERILE IRR 1000ML POUR (IV SOLUTION) ×3 IMPLANT

## 2020-08-27 NOTE — H&P (Signed)
Randy Walker is an 64 y.o. male.    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 non-bulky para-aortic (between adrenal and hilum) and interaorto-caval (few cm below renal vein). 3 artery (superior at lower edge of vein, mid also large and few cm inferior, tiny lower pole) / 1 vein left renovascular anatomy. Cr 0.8, no contralateral lesiosn. No chest lesions.   PMH sig for psoriasis/Humira (held since around 12/2019), HTN, DM2 (A1c <6). 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, then [employer]) His PCP is Cathlean Cower MD with [facility].   Today " Chef" is seen to proceed with LEFT nephrectomy and node dissection. No interval fevers. C19 screen negative. Hgb 16, Cr <1.    Past Medical History:  Diagnosis Date  . Adenomatous polyps 11/17/2011  . Alcohol use 11/17/2011  . ALLERGIC RHINITIS 09/10/2007  . ANXIETY 09/10/2007  . BACK PAIN 12/31/2010  . [place] DISEASE, LUMBAR 12/31/2010  . GERD (gastroesophageal reflux disease) 11/23/2016  . Hyperglycemia 11/23/2016  . HYPERLIPIDEMIA 12/31/2010  . Hypertension   . Personal history of rectal adenomas 01/29/2008  . PSORIASIS 12/31/2010  . Psoriatic arthritis ([place])   . PULMONARY EMBOLISM, HX OF 09/10/2007  . RASH-NONVESICULAR 04/23/2009  . Renal mass   . SMOKER 12/31/2010    Past Surgical History:  Procedure Laterality Date  . ROTATOR CUFF REPAIR  Jan, 2010   [facility], Left  . spleen repair    . TOTAL HIP ARTHROPLASTY Left 09/09/2017   Procedure: LEFT TOTAL HIP ARTHROPLASTY ANTERIOR APPROACH;  Surgeon: Dorna Leitz, MD;  Location: [facility];  Service: Orthopedics;  Laterality: Left;  . WISDOM TOOTH EXTRACTION      Family History  Problem Relation Age of Onset  . Colon cancer Neg Hx   . Esophageal cancer Neg Hx   . Rectal cancer Neg Hx   . Stomach  cancer Neg Hx    Social History:  reports that he has been smoking cigarettes. He has been smoking about 0.50 packs per day. He has never used smokeless tobacco. He reports current alcohol use. He reports that he does not use drugs.  Allergies: No Known Allergies  No medications prior to admission.    No results found for this or any previous visit (from the past 48 hour(s)). No results found.  Review of Systems  Constitutional: Negative for chills and fever.  All other systems reviewed and are negative.   There were no vitals taken for this visit. Physical Exam Vitals reviewed.  HENT:     Head: Normocephalic.     Nose: Nose normal.  Eyes:     Pupils: Pupils are equal, round, and reactive to light.  Cardiovascular:     Rate and Rhythm: Normal rate.  Pulmonary:     Effort: Pulmonary effort is normal.  Abdominal:     General: Abdomen is flat.  Genitourinary:    Comments: No CVAT at present Musculoskeletal:     Cervical back: Normal range of motion.  Skin:    General: Skin is warm.  Neurological:     General: No focal deficit present.     Mental Status: He is alert.  Psychiatric:        Mood and Affect: Mood normal.      Assessment/Plan  Proceed as planned with left  nephrectomy for large oligometastatic cancer. Risks (includibg non-cure and mortality), benefits, alternatives, expected peri-op course discussed previously and reiterated today.   Alexis Frock, MD 08/27/2020, 5:16 AM

## 2020-08-27 NOTE — Anesthesia Procedure Notes (Signed)
Procedure Name: Intubation Date/Time: 08/27/2020 8:37 AM Performed by: Sharlette Dense, CRNA Patient Re-evaluated:Patient Re-evaluated prior to induction Oxygen Delivery Method: Circle system utilized Preoxygenation: Pre-oxygenation with 100% oxygen Induction Type: IV induction Ventilation: Mask ventilation without difficulty and Oral airway inserted - appropriate to patient size Laryngoscope Size: Miller and 3 Grade View: Grade I Tube type: Oral Tube size: 8.0 mm Number of attempts: 1 Airway Equipment and Method: Stylet Placement Confirmation: ETT inserted through vocal cords under direct vision,  positive ETCO2 and breath sounds checked- equal and bilateral Secured at: 21 cm Tube secured with: Tape Dental Injury: Teeth and Oropharynx as per pre-operative assessment

## 2020-08-27 NOTE — Op Note (Signed)
NAMEREXFORD, PREVO MEDICAL RECORD WG:[id number] ACCOUNT [id number] DATE OF BIRTH:October 14, 1956 FACILITY: [facility] LOCATION: [facility] PHYSICIAN:Zuriel Yeaman, MD  OPERATIVE REPORT  DATE OF PROCEDURE:  08/27/2020  PREOPERATIVE DIAGNOSIS:  Large left renal mass with periaortic adenopathy.  PROCEDURE: 1.  Robotic-assisted laparoscopic left radical nephrectomy. 2.  Laparoscopic retroperitoneal lymph node dissection.  ESTIMATED BLOOD LOSS:  Under 50 mL.  COMPLICATIONS:  None.  SPECIMENS:   1.  Left radical nephrectomy. 2.  Left periaortic lymph nodes for permanent pathology.  SURGEON:  Alexis Frock, MD  ASSISTANT:  Debbrah Alar, PA  FINDINGS: 1.  Three artery, 1 vein, left renal vascular anatomy as anticipated. 2.  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 3.  Borderline periaortic adenopathy, nonbulky.  DRAINS:  Foley catheter to straight drain.  INDICATIONS:  The patient is a very pleasant 63 year old man found to have a very large left renal mass by my partners.  He does have some nonbulky periaortic adenopathy consistent with either small volume metastasis versus reactive changes, no obvious  distant disease.  Options were discussed for management including recommended path of right radical nephrectomy with retroperitoneal lymphadenectomy for oncologic improvement and staging and he wished to proceed.  Informed consent was obtained and placed in  the medical record.  DESCRIPTION OF PROCEDURE:  The patient is being identified, the procedure being left robotic radical nephrectomy with retroperitoneal lymph node dissection was confirmed.  Procedure timeout was performed.  Intravenous antibiotics administered.  General  endotracheal anesthesia induced.  The patient was placed into a left side up full flank position, pulling 15 degrees of table flexion, superior arm elevator, axillary roll, sequential compression devices, bottom leg bent, top leg straight.  He was  further  fastened to the operative table using 3-inch tape over foam padding across the supraxiphoid chest and his pelvis.  A Foley catheter was placed free to straight drain and a sterile field was created by clipper shaving and then prepping and draping  his entire left flank and abdomen using chlorhexidine gluconate.  Next, a high-flow, low-pressure pneumoperitoneum was obtained using Veress technique in the left lower quadrant, having passed the aspiration and drop test.  An 8 mm robotic camera port  was then placed approximately 4 fingerbreadths superolateral to the umbilicus.  Laparoscopic examination of the vagina revealed no significant adhesions and no visceral injury.  Distal ports were placed as follows:  Left subcostal 8 mm robotic port, left  far lateral 8 mm robotic port approximately 4 fingerbreadths superolateral to the anterior superior iliac spine, left paramedian inferior robotic port approximately 1 handbreadth superior to pubic ramus and two 12 mm assistant port sites to midline, one  in the supraumbilical crease and another approximately 1 handbreadth superior to this, which was 2 fingerbreadths superior to the plane of the camera port.  Robot was docked and passed the electronic checks.  Initial attention was directed at developing  the retroperitoneum.  There were significant omental attachment anterior to the descending colon and its mesentery and exquisite care was taken to take down these omental attachments.  Next, an incision was made lateral to the descending colon from the  area of the splenic flexure towards the area of the internal ring and the colon was carefully swept medially, taking exquisite care to avoid devascularization of the mesentery.  This plane was inspected.  It was somewhat desmoplastic, but without obvious  locally advanced disease.  Superiorly, lateral splenic attachments were taken down, to allow the spleen to rotate medially.  A plane was also developed between the  anterior surface of Gerota fascia and the posterior pancreas, allowing the pancreas and  distal plane to rotate medially away from the structures.  The lower pole of the kidney area was identified, placed on gentle lateral traction.  Dissection proceeded medial to this.  The left ureter and gonadal vessels were encountered and placed on  gentle lateral traction.  Psoas muscle was identified, as was the aorta.  Dissection proceeded within this triangle towards the renal hilum.  Towards the renal hilum, the planes were significantly desmoplastic as expected given some localized  lymphadenopathy and a plane was chosen directly on the lateral aspect of the aorta for remaining retroperitoneal lymph node dissection throughout the entire length of the kidney.  Careful clipping using hemo-o-loks was performed for lymphostasis throughout this  dissection.  The renal hilum consisted of a 3 artery, 1 vein renovascular anatomy as anticipated.  The lower most artery was quite small in caliber, controlled using Hem-o-Lok clip.  The middle artery was larger in caliber, controlled using Hem-o-Lok  clip proximal, vascular stapler distal and the larger superior artery controlled using Hem-o-Lok clip proximal, vascular stapler distal.  After having obtained control of the arterial inflow, the vein was taken using a vascular stapler.  Dissection  proceeded again very, very medial directly on the aorta, thus completely resecting the adrenal gland.  Superior attachments were taken down with the cautery scissors, as were lateral attachments.  The ureter was then doubly clipped and ligated and the  gonadal vessels, which were somewhat large in caliber and recanalized, were controlled using vascular stapler.  This completely freed up the left radical nephrectomy specimen with periaortic lymph nodes en bloc.  This was placed into an extra-large  EndoCatch bag for later retrieval.  Sponge and needle counts were correct.  Hemostasis  appeared excellent.  The area of the hilum and lymphadenectomy was once again inspected.  Again, hemostasis and lymphostasis appeared excellent.  Sponge and needle  counts were correct.  Robot was then undocked.  Specimen was retrieved by extending the previous assistant port sites to midline, removing the en block kidney with periaortic lymph nodes, set aside on the back table.  The lymphadenectomy portion was separated from  this and sent as a separate specimen.  The extraction site was closed at the level of the fascia using figure-of-eight PDS x8, followed by reapproximation of Scarpa's with 0 running Vicryl.  All incision sites were infiltrated with dilute lipolyzed  Marcaine and closed at the level of the skin using subcuticular Monocryl, followed by Dermabond.  The procedure was then terminated.  The patient tolerated the procedure well.  No immediate perioperative complications.  The patient was taken to  postanesthesia care unit in stable condition with plan for inpatient admission.  Please note, first assistant Debbrah Alar was crucial for all portions of the surgery today.  She provided invaluable retraction, suctioning, specimen manipulation, vascular clipping, vascular stapling and general first assistance.  VN/NUANCE  D:08/27/2020 T:08/27/2020 JOB:[id number]

## 2020-08-27 NOTE — Anesthesia Postprocedure Evaluation (Signed)
Anesthesia Post Note  Patient: Randy Walker  Procedure(s) Performed: XI ROBOTIC ASSISTED LAPAROSCOPIC RADICAL NEPHRECTOMY (Left ) RETROPERITONEAL LYMPH NODE DISSECTION (Bilateral )     Patient location during evaluation: PACU Anesthesia Type: General Level of consciousness: awake and alert Pain management: pain level controlled Vital Signs Assessment: post-procedure vital signs reviewed and stable Respiratory status: spontaneous breathing, nonlabored ventilation, respiratory function stable and patient connected to nasal cannula oxygen Cardiovascular status: blood pressure returned to baseline and stable Postop Assessment: no apparent nausea or vomiting Anesthetic complications: no   No complications documented.  Last Vitals:  Vitals:   08/27/20 1400 08/27/20 1417  BP: 114/69 (!) 150/103  Pulse: 85 79  Resp: 13 18  Temp: 36.6 C (!) 36.3 C  SpO2: 94% 95%    Last Pain:  Vitals:   08/27/20 1417  TempSrc: Oral  PainSc:                  Effie Berkshire

## 2020-08-27 NOTE — Transfer of Care (Signed)
Immediate Anesthesia Transfer of Care Note  Patient: Randy Walker  Procedure(s) Performed: XI ROBOTIC ASSISTED LAPAROSCOPIC RADICAL NEPHRECTOMY (Left ) RETROPERITONEAL LYMPH NODE DISSECTION (Bilateral )  Patient Location: PACU  Anesthesia Type:General  Level of Consciousness: awake  Airway & Oxygen Therapy: Patient Spontanous Breathing and Patient connected to face mask oxygen  Post-op Assessment: Report given to RN and Post -op Vital signs reviewed and stable  Post vital signs: Reviewed and stable  Last Vitals:  Vitals Value Taken Time  BP 140/109 08/27/20 1217  Temp    Pulse 89 08/27/20 1219  Resp 13 08/27/20 1219  SpO2 100 % 08/27/20 1219  Vitals shown include unvalidated device data.  Last Pain:  Vitals:   08/27/20 0705  TempSrc:   PainSc: 0-No pain         Complications: No complications documented.

## 2020-08-27 NOTE — Anesthesia Preprocedure Evaluation (Addendum)
Anesthesia Evaluation  Patient identified by MRN, date of birth, ID band Patient awake    Reviewed: Allergy & Precautions, NPO status , Patient's Chart, lab work & pertinent test results  Airway Mallampati: II  TM Distance: >3 FB Neck ROM: Full    Dental  (+) Teeth Intact, Dental Advisory Given   Pulmonary Current Smoker and Patient abstained from smoking.,    breath sounds clear to auscultation       Cardiovascular hypertension, Pt. on medications  Rhythm:Regular Rate:Normal     Neuro/Psych Anxiety    GI/Hepatic Neg liver ROS, GERD  Medicated,  Endo/Other  negative endocrine ROS  Renal/GU negative Renal ROS     Musculoskeletal negative musculoskeletal ROS (+)   Abdominal Normal abdominal exam  (+)   Peds  Hematology negative hematology ROS (+)   Anesthesia Other Findings   Reproductive/Obstetrics                            Anesthesia Physical Anesthesia Plan  ASA: II  Anesthesia Plan: General   Post-op Pain Management:    Induction: Intravenous  PONV Risk Score and Plan: 2 and Ondansetron, Dexamethasone and Midazolam  Airway Management Planned: Oral ETT  Additional Equipment: None  Intra-op Plan:   Post-operative Plan: Extubation in OR  Informed Consent: I have reviewed the patients History and Physical, chart, labs and discussed the procedure including the risks, benefits and alternatives for the proposed anesthesia with the patient or authorized representative who has indicated his/her understanding and acceptance.     Dental advisory given  Plan Discussed with: CRNA  Anesthesia Plan Comments: (2 IV's)       Anesthesia Quick Evaluation

## 2020-08-27 NOTE — Brief Op Note (Signed)
08/27/2020  11:59 AM  PATIENT:  Randy Walker  64 y.o. male  PRE-OPERATIVE DIAGNOSIS:  LARGE LEFT RENAL MASS  POST-OPERATIVE DIAGNOSIS:  LARGE LEFT RENAL MASS  PROCEDURE:  Procedure(s) with comments: XI ROBOTIC ASSISTED LAPAROSCOPIC RADICAL NEPHRECTOMY (Left) - 3.5 HRS RETROPERITONEAL LYMPH NODE DISSECTION (Bilateral)  SURGEON:  Surgeon(s) and Role:    Alexis Frock, MD - Primary  PHYSICIAN ASSISTANT:   ASSISTANTS: Debbrah Alar PA   ANESTHESIA:   local and general  EBL:  150 mL   BLOOD ADMINISTERED:none  DRAINS: none   LOCAL MEDICATIONS USED:  MARCAINE     SPECIMEN:  Source of Specimen:  1 - left radical nephrectomy; 2 - periaortic lymph nodes  DISPOSITION OF SPECIMEN:  PATHOLOGY  COUNTS:  YES  TOURNIQUET:  * No tourniquets in log *  DICTATION: .Other Dictation: Dictation Number  [id number]  PLAN OF CARE: Admit to inpatient   PATIENT DISPOSITION:  PACU - hemodynamically stable.   Delay start of Pharmacological VTE agent (>24hrs) due to surgical blood loss or risk of bleeding: yes

## 2020-08-28 ENCOUNTER — Encounter (HOSPITAL_COMMUNITY): Payer: Self-pay | Admitting: Urology

## 2020-08-28 LAB — BASIC METABOLIC PANEL
Anion gap: 9 (ref 5–15)
BUN: 13 mg/dL (ref 8–23)
CO2: 27 mmol/L (ref 22–32)
Calcium: 8.7 mg/dL — ABNORMAL LOW (ref 8.9–10.3)
Chloride: 100 mmol/L (ref 98–111)
Creatinine, Ser: 1.37 mg/dL — ABNORMAL HIGH (ref 0.61–1.24)
GFR calc non Af Amer: 55 mL/min — ABNORMAL LOW (ref >60)
Glucose, Bld: 147 mg/dL — ABNORMAL HIGH (ref 70–99)
Potassium: 4.6 mmol/L (ref 3.5–5.1)
Sodium: 136 mmol/L (ref 135–145)

## 2020-08-28 LAB — HEMOGLOBIN AND HEMATOCRIT, BLOOD
HCT: 40.5 % (ref 39.0–52.0)
Hemoglobin: 14.1 g/dL (ref 13.0–17.0)

## 2020-08-28 MED ORDER — HEPARIN SODIUM (PORCINE) 5000 UNIT/ML IJ SOLN
5000.0000 [IU] | Freq: Three times a day (TID) | INTRAMUSCULAR | Status: DC
  Administered 2020-08-28 – 2020-08-29 (×3): 5000 [IU] via SUBCUTANEOUS
  Filled 2020-08-28 (×4): qty 1

## 2020-08-28 MED ORDER — SPIRITUS FRUMENTI
1.0000 | Freq: Two times a day (BID) | ORAL | Status: DC
  Administered 2020-08-28 (×2): 1 via ORAL
  Filled 2020-08-28 (×4): qty 1

## 2020-08-28 MED ORDER — MORPHINE SULFATE (PF) 2 MG/ML IV SOLN: 2.0000 mg | INTRAVENOUS | Status: DC | PRN

## 2020-08-28 MED ORDER — GABAPENTIN 300 MG PO CAPS
300.0000 mg | ORAL_CAPSULE | Freq: Three times a day (TID) | ORAL | Status: DC
  Administered 2020-08-28 – 2020-08-29 (×4): 300 mg via ORAL
  Filled 2020-08-28 (×4): qty 1

## 2020-08-28 NOTE — Progress Notes (Signed)
1 Day Post-Op Subjective: NAEON. AFHDS. Pain poorly controlled. Tolerating CLD, passing flatus, ambulated several times.   Objective: Vital signs in last 24 hours: Temp:  [97.4 F (36.3 C)-98.5 F (36.9 C)] 98.5 F (36.9 C) (10/07 0454) Pulse Rate:  [75-90] 75 (10/07 0454) Resp:  [9-20] 18 (10/07 0454) BP: (106-150)/(69-109) 122/96 (10/07 0454) SpO2:  [92 %-100 %] 92 % (10/07 0454) Weight:  [89.8 kg] 89.8 kg (10/06 1417)  Intake/Output from previous day: 10/06 0701 - 10/07 0700 In: 5195.6 [P.O.:480; I.V.:4515.6; IV Piggyback:200] Out: 1900 [Urine:1750; Blood:150] Intake/Output this shift: No intake/output data recorded.  Physical Exam:  General: Alert and oriented CV: Regular rate Lungs: NWOB on RA Abdomen: Soft, ND, appropriately tender Incisions: C/D/I without signs of infection GU: Foley catheter draining yellow urine  Ext: NT, No erythema  Lab Results: Recent Labs    08/27/20 1233 08/28/20 0433  HGB 16.1 14.1  HCT 46.7 40.5   BMET Recent Labs    08/28/20 0433  NA 136  K 4.6  CL 100  CO2 27  GLUCOSE 147*  BUN 13  CREATININE 1.37*  CALCIUM 8.7*     Studies/Results: No results found.  Assessment/Plan: 64yo male with large left renal mass and periaortic adenopathy s/p robotic radical left nephrectomy and peri-aortic lymphadenopathy on 10/6. Overall recovering well but with poorly controlled pain.  - ADAT to regular, continue bowel regimen - Stop IVF - Remove foley/TOV - Add gabapentin 300mg  TID. Switch IV dilaudid to morphine per patient preference. Continue PRN oxycodone 10mg  q4h PRN - Continue home meds - Daily labs - Start [facility] for DVT ppx  - EDD later today versus tomorrow    LOS: 1 day   Randy Walker 08/28/2020, 7:57 AM

## 2020-08-28 NOTE — Progress Notes (Signed)
Pt ambulated on hall way via walker  twice this shift. Tolerated fairly.

## 2020-08-29 ENCOUNTER — Other Ambulatory Visit (HOSPITAL_COMMUNITY): Payer: Self-pay | Admitting: Urology

## 2020-08-29 LAB — BASIC METABOLIC PANEL
Anion gap: 9 (ref 5–15)
BUN: 18 mg/dL (ref 8–23)
CO2: 26 mmol/L (ref 22–32)
Calcium: 8.5 mg/dL — ABNORMAL LOW (ref 8.9–10.3)
Chloride: 104 mmol/L (ref 98–111)
Creatinine, Ser: 1.34 mg/dL — ABNORMAL HIGH (ref 0.61–1.24)
GFR calc non Af Amer: 56 mL/min — ABNORMAL LOW (ref >60)
Glucose, Bld: 99 mg/dL (ref 70–99)
Potassium: 4.2 mmol/L (ref 3.5–5.1)
Sodium: 139 mmol/L (ref 135–145)

## 2020-08-29 LAB — HEMOGLOBIN AND HEMATOCRIT, BLOOD
HCT: 43.2 % (ref 39.0–52.0)
Hemoglobin: 14.5 g/dL (ref 13.0–17.0)

## 2020-08-29 MED ORDER — GABAPENTIN 300 MG PO CAPS: 300.0000 mg | ORAL_CAPSULE | Freq: Three times a day (TID) | ORAL | 0 refills | Status: DC

## 2020-08-29 MED FILL — GABAPENTIN 300 MG CAPSULE: 300 | 14 days supply | Qty: 42 | Fill #0

## 2020-08-29 NOTE — Plan of Care (Signed)
  Problem: Education: Goal: Knowledge of General Education information will improve Description: Including pain rating scale, medication(s)/side effects and non-pharmacologic comfort measures Outcome: Completed/Met   Problem: Health Behavior/Discharge Planning: Goal: Ability to manage health-related needs will improve Outcome: Completed/Met   Problem: Clinical Measurements: Goal: Ability to maintain clinical measurements within normal limits will improve Outcome: Completed/Met Goal: Will remain free from infection Outcome: Completed/Met Goal: Diagnostic test results will improve Outcome: Completed/Met Goal: Respiratory complications will improve Outcome: Completed/Met Goal: Cardiovascular complication will be avoided Outcome: Completed/Met   Problem: Activity: Goal: Risk for activity intolerance will decrease Outcome: Completed/Met   Problem: Nutrition: Goal: Adequate nutrition will be maintained Outcome: Completed/Met   Problem: Coping: Goal: Level of anxiety will decrease Outcome: Completed/Met   Problem: Elimination: Goal: Will not experience complications related to bowel motility Outcome: Completed/Met Goal: Will not experience complications related to urinary retention Outcome: Completed/Met   Problem: Pain Managment: Goal: General experience of comfort will improve Outcome: Completed/Met   Problem: Safety: Goal: Ability to remain free from injury will improve Outcome: Completed/Met   Problem: Skin Integrity: Goal: Risk for impaired skin integrity will decrease Outcome: Completed/Met   Problem: Education: Goal: Knowledge of the prescribed therapeutic regimen will improve Outcome: Completed/Met   Problem: Bowel/Gastric: Goal: Gastrointestinal status for postoperative course will improve Outcome: Completed/Met   Problem: Clinical Measurements: Goal: Postoperative complications will be avoided or minimized Outcome: Completed/Met   Problem:  Respiratory: Goal: Ability to achieve and maintain a regular respiratory rate will improve Outcome: Completed/Met   Problem: Skin Integrity: Goal: Demonstration of wound healing without infection will improve Outcome: Completed/Met   Problem: Urinary Elimination: Goal: Ability to avoid or minimize complications of infection will improve Outcome: Completed/Met Goal: Ability to achieve and maintain urine output will improve Outcome: Completed/Met   

## 2020-08-29 NOTE — Discharge Summary (Addendum)
Date of admission: 08/27/2020  Date of discharge: 08/29/2020  Admission diagnosis: Large left renal mass and periaortic adenopathy  Discharge diagnosis: Large left renal mass and periaortic adenopathy  Secondary diagnoses: None  History and Physical: For full details, please see admission history and physical. Briefly, Randy Walker is a 64 y.o. patient with large left renal mass and periaortic adenopathy.   Hospital Course:  He underwent robotic radical left nephrectomy with peri-aortic lymphadenectomy on 08/27/20. He tolerated the procedure well and was transferred to the floor after receiving routine post-operative care. His diet was gradually advanced, and his pain was controlled with analgesics.  His foley catheter was removed on POD1, and he was able to void spontaneously. He was started on [facility] for DVT ppx on POD1. His pain was poorly controlled, so gabapentin 300mg  TID was added with subsequent improvement in pain control.  By POD2, he was tolerating a regular diet without nausea/emesis, passing flatus, ambulating, voiding spontaneously, and having pain well-controlled with oral analgesics. He was discharged home.   Laboratory values: Recent Labs    08/27/20 1233 08/28/20 0433 08/29/20 0458  HGB 16.1 14.1 14.5  HCT 46.7 40.5 43.2   Recent Labs    08/28/20 0433 08/29/20 0458  CREATININE 1.37* 1.34*    Disposition: Home  Discharge instruction: The patient was instructed to be ambulatory but told to refrain from heavy lifting, strenuous activity, or driving.   Discharge medications:  Allergies as of 08/29/2020   No Known Allergies      Medication List     STOP taking these medications    Humira Pen 40 MG/0.4ML Pnkt Generic drug: Adalimumab   VITAMIN D3 PO       TAKE these medications    ALPRAZolam 1 MG tablet Commonly known as: XANAX TAKE 1 TABLET BY MOUTH AT BEDTIME AS NEEDED What changed: reasons to take this   amLODipine 10 MG tablet Commonly  known as: NORVASC TAKE 1 TABLET BY MOUTH ONCE DAILY   gabapentin 300 MG capsule Commonly known as: Neurontin Take 1 capsule (300 mg total) by mouth 3 (three) times daily for 14 days.   losartan 100 MG tablet Commonly known as: COZAAR TAKE 1 TABLET BY MOUTH ONCE DAILY   oxyCODONE-acetaminophen 5-325 MG tablet Commonly known as: PERCOCET/ROXICET Take 1-2 tablets by mouth every 6 (six) hours as needed for moderate pain or severe pain. What changed:  how much to take when to take this reasons to take this   pantoprazole 40 MG tablet Commonly known as: PROTONIX TAKE 1 TABLET BY MOUTH ONCE DAILY        Follow up:   Follow-up Information     Alexis Frock, MD On 09/09/2020.   Specialty: Urology Why: at 9:30 for MD visit and pathology review Contact information: [address] [place] [id number] [phone]                  I have seen and examined the patient on the day of discharge and agree that he is meeting all DC criteria. Precautions discussed with pt who voiced understanding. GFR and Hgb acceptable.

## 2020-08-29 NOTE — Discharge Instructions (Signed)

## 2020-09-01 LAB — SURGICAL PATHOLOGY

## 2020-09-15 ENCOUNTER — Other Ambulatory Visit: Payer: Self-pay | Admitting: Internal Medicine

## 2020-09-15 NOTE — Telephone Encounter (Signed)
Done erx 

## 2020-09-30 ENCOUNTER — Telehealth: Payer: Self-pay | Admitting: Internal Medicine

## 2020-09-30 NOTE — Telephone Encounter (Signed)
Sent to Dr. John. 

## 2020-09-30 NOTE — Telephone Encounter (Signed)
Dr. Allyn Kenner his dermatologist recommended that this patient needs to get his liver function tested and a CMP  Patient # [phone]

## 2020-09-30 NOTE — Telephone Encounter (Signed)
Randy Walker for rov with me  Also please ask pt if he had his referral to GI addressed from July 2021  If not, we may need to start the process again

## 2020-10-01 NOTE — Telephone Encounter (Signed)
I was able to Lvm fot pt to call the clinic with information regarding if he has or has not seen a GI doctor back in July of 2021.  **Pt has an appointment with Dr. Jenny Reichmann on 10/13/2020 at 3:20 pm.

## 2020-10-07 ENCOUNTER — Other Ambulatory Visit (INDEPENDENT_AMBULATORY_CARE_PROVIDER_SITE_OTHER): Payer: 59

## 2020-10-07 DIAGNOSIS — Z125 Encounter for screening for malignant neoplasm of prostate: Secondary | ICD-10-CM

## 2020-10-07 DIAGNOSIS — E538 Deficiency of other specified B group vitamins: Secondary | ICD-10-CM | POA: Diagnosis not present

## 2020-10-07 DIAGNOSIS — Z0000 Encounter for general adult medical examination without abnormal findings: Secondary | ICD-10-CM

## 2020-10-07 DIAGNOSIS — R739 Hyperglycemia, unspecified: Secondary | ICD-10-CM | POA: Diagnosis not present

## 2020-10-07 DIAGNOSIS — E559 Vitamin D deficiency, unspecified: Secondary | ICD-10-CM | POA: Diagnosis not present

## 2020-10-07 NOTE — Addendum Note (Signed)
Addended by: Cresenciano Lick on: 10/07/2020 04:03 PM   Modules accepted: Orders

## 2020-10-07 NOTE — Addendum Note (Signed)
Addended by: Cresenciano Lick on: 10/07/2020 04:04 PM   Modules accepted: Orders

## 2020-10-08 LAB — LDL CHOLESTEROL, DIRECT: Direct LDL: 90 mg/dL

## 2020-10-08 LAB — LIPID PANEL
Cholesterol: 176 mg/dL (ref 0–200)
HDL: 51.1 mg/dL (ref >39.00)
NonHDL: 124.79
Total CHOL/HDL Ratio: 3
Triglycerides: 273 mg/dL — ABNORMAL HIGH (ref 0.0–149.0)
VLDL: 54.6 mg/dL — ABNORMAL HIGH (ref 0.0–40.0)

## 2020-10-08 LAB — CBC WITH DIFFERENTIAL/PLATELET
Basophils Absolute: 0.1 10*3/uL (ref 0.0–0.1)
Basophils Relative: 1.1 % (ref 0.0–3.0)
Eosinophils Absolute: 0.2 10*3/uL (ref 0.0–0.7)
Eosinophils Relative: 2.6 % (ref 0.0–5.0)
HCT: 47.8 % (ref 39.0–52.0)
Hemoglobin: 16.6 g/dL (ref 13.0–17.0)
Lymphocytes Relative: 29 % (ref 12.0–46.0)
Lymphs Abs: 1.8 10*3/uL (ref 0.7–4.0)
MCHC: 34.7 g/dL (ref 30.0–36.0)
MCV: 97.5 fl (ref 78.0–100.0)
Monocytes Absolute: 0.5 10*3/uL (ref 0.1–1.0)
Monocytes Relative: 7.9 % (ref 3.0–12.0)
Neutro Abs: 3.8 10*3/uL (ref 1.4–7.7)
Neutrophils Relative %: 59.4 % (ref 43.0–77.0)
Platelets: 225 10*3/uL (ref 150.0–400.0)
RBC: 4.9 Mil/uL (ref 4.22–5.81)
RDW: 12.6 % (ref 11.5–15.5)
WBC: 6.3 10*3/uL (ref 4.0–10.5)

## 2020-10-08 LAB — HEMOGLOBIN A1C: Hgb A1c MFr Bld: 5.6 % (ref 4.6–6.5)

## 2020-10-08 LAB — HEPATIC FUNCTION PANEL
ALT: 27 U/L (ref 0–53)
AST: 19 U/L (ref 0–37)
Albumin: 4 g/dL (ref 3.5–5.2)
Alkaline Phosphatase: 93 U/L (ref 39–117)
Bilirubin, Direct: 0.1 mg/dL (ref 0.0–0.3)
Total Bilirubin: 0.5 mg/dL (ref 0.2–1.2)
Total Protein: 6.7 g/dL (ref 6.0–8.3)

## 2020-10-08 LAB — BASIC METABOLIC PANEL
BUN: 13 mg/dL (ref 6–23)
CO2: 32 mEq/L (ref 19–32)
Calcium: 9.9 mg/dL (ref 8.4–10.5)
Chloride: 100 mEq/L (ref 96–112)
Creatinine, Ser: 1.21 mg/dL (ref 0.40–1.50)
GFR: 63.77 mL/min (ref >60.00)
Glucose, Bld: 103 mg/dL — ABNORMAL HIGH (ref 70–99)
Potassium: 4.2 mEq/L (ref 3.5–5.1)
Sodium: 139 mEq/L (ref 135–145)

## 2020-10-08 LAB — URINALYSIS, ROUTINE W REFLEX MICROSCOPIC
Bilirubin Urine: NEGATIVE
Hgb urine dipstick: NEGATIVE
Ketones, ur: NEGATIVE
Leukocytes,Ua: NEGATIVE
Nitrite: NEGATIVE
RBC / HPF: NONE SEEN (ref 0–?)
Specific Gravity, Urine: <=1.005 — AB (ref 1.000–1.030)
Total Protein, Urine: NEGATIVE
Urine Glucose: NEGATIVE
Urobilinogen, UA: 0.2 (ref 0.0–1.0)
WBC, UA: NONE SEEN (ref 0–?)
pH: 6 (ref 5.0–8.0)

## 2020-10-08 LAB — VITAMIN D 25 HYDROXY (VIT D DEFICIENCY, FRACTURES): VITD: 30.86 ng/mL (ref 30.00–100.00)

## 2020-10-08 LAB — TSH: TSH: 3.32 u[IU]/mL (ref 0.35–4.50)

## 2020-10-08 LAB — PSA: PSA: 0.33 ng/mL (ref 0.10–4.00)

## 2020-10-08 LAB — VITAMIN B12: Vitamin B-12: 143 pg/mL — ABNORMAL LOW (ref 211–911)

## 2020-10-10 ENCOUNTER — Telehealth: Payer: Self-pay | Admitting: Internal Medicine

## 2020-10-10 ENCOUNTER — Encounter: Payer: Self-pay | Admitting: Internal Medicine

## 2020-10-10 MED ORDER — AMLODIPINE BESYLATE 10 MG PO TABS
10.0000 mg | ORAL_TABLET | Freq: Every day | ORAL | 2 refills | Status: DC
Start: 2020-10-10 — End: 2021-10-06

## 2020-10-10 MED ORDER — PANTOPRAZOLE SODIUM 40 MG PO TBEC
40.0000 mg | DELAYED_RELEASE_TABLET | Freq: Every day | ORAL | 3 refills | Status: DC
Start: 2020-10-10 — End: 2021-10-06

## 2020-10-10 NOTE — Telephone Encounter (Signed)
Sent to Dr. John. 

## 2020-10-10 NOTE — Telephone Encounter (Signed)
Ok for [employer] and protonix  But unable to for percocet across state lines

## 2020-10-10 NOTE — Telephone Encounter (Signed)
   Patient states he went to [place] and forgot all of his medications and doesn't return until after Christmas. He is requesting amLODipine (NORVASC) 10 MG tablet and oxyCODONE-acetaminophen (PERCOCET/ROXICET) 5-325 MG tablet and pantoprazole (PROTONIX) 40 MG tablet  Requesting medication be sent [place] [address] Phone [phone]   Please advise

## 2020-10-13 ENCOUNTER — Ambulatory Visit: Payer: 59 | Admitting: Internal Medicine

## 2020-10-20 ENCOUNTER — Other Ambulatory Visit: Payer: Self-pay | Admitting: Internal Medicine

## 2020-11-12 ENCOUNTER — Other Ambulatory Visit: Payer: Self-pay | Admitting: Internal Medicine

## 2020-11-27 ENCOUNTER — Telehealth: Payer: Self-pay | Admitting: Internal Medicine

## 2020-11-27 NOTE — Telephone Encounter (Signed)
Patients wife calling to make [place] aware that the patient tested positive for covid today and he is experiencing fever,cough, congestion,body aches, and fatigue. They were wondering if we could refer him to get the monoclonal antibodies because of his medical history or what we could do. [phone]

## 2020-11-28 ENCOUNTER — Telehealth: Payer: Self-pay

## 2020-11-28 ENCOUNTER — Other Ambulatory Visit: Payer: Self-pay | Admitting: Infectious Diseases

## 2020-11-28 ENCOUNTER — Telehealth: Payer: Self-pay | Admitting: Infectious Diseases

## 2020-11-28 ENCOUNTER — Other Ambulatory Visit: Payer: 59

## 2020-11-28 MED ORDER — NIRMATRELVIR/RITONAVIR (PAXLOVID)TABLET: 3.0000 | ORAL_TABLET | Freq: Two times a day (BID) | ORAL | 0 refills | Status: AC

## 2020-11-28 NOTE — Telephone Encounter (Signed)
Called to discuss with patient about COVID-19 symptoms and the use of one of the available treatments for those with mild to moderate Covid symptoms and at a high risk of hospitalization.  Pt appears to qualify for outpatient treatment due to co-morbid conditions and/or a member of an at-risk group in accordance with the FDA Emergency Use Authorization.     Symptom onset: 11/26/2020 - body aches, congestion, fever  Vaccinated: moderna completed   Booster? "1 month ago" Qualifiers:   Has not had any in 1 week xanax   Janene Madeira, NP     Outpatient Oral COVID Treatment Note  I connected with [name] on 11/28/2020/1:40 PM by telephone and verified that I am speaking with the correct person using two identifiers.  I discussed the limitations, risks, security, and privacy concerns of performing an evaluation and management service by telephone and the availability of in person appointments. I also discussed with the patient that there may be a patient responsible charge related to this service. The patient expressed understanding and agreed to proceed.  Patient location: [place], [place]  Provider location: [facility]   Diagnosis: COVID-19 infection  Purpose of visit: Discussion of potential use of Molnupiravir or Paxlovid, a new treatment for mild to moderate COVID-19 viral infection in non-hospitalized patients.   Subjective: Patient is a 65 y.o. male who has been diagnosed with COVID 19 viral infection.  Their symptoms began on 11/26/2020 with body aches, congestion, fever.     Past Medical History:  Diagnosis Date  . Adenomatous polyps 11/17/2011  . Alcohol use 11/17/2011  . ALLERGIC RHINITIS 09/10/2007  . ANXIETY 09/10/2007  . BACK PAIN 12/31/2010  . [place] DISEASE, LUMBAR 12/31/2010  . GERD (gastroesophageal reflux disease) 11/23/2016  . Hyperglycemia 11/23/2016  . HYPERLIPIDEMIA 12/31/2010  . Hypertension   . Personal history of rectal adenomas 01/29/2008  . PSORIASIS  12/31/2010  . Psoriatic arthritis ([place])   . PULMONARY EMBOLISM, HX OF 09/10/2007  . RASH-NONVESICULAR 04/23/2009  . Renal mass   . SMOKER 12/31/2010    No Known Allergies  Outpatient Medications Prior to Visit  Medication Sig Dispense Refill  . ALPRAZolam (XANAX) 1 MG tablet TAKE 1 TABLET BY MOUTH AT BEDTIME AS NEEDED (Patient taking differently: Take 1 mg by mouth at bedtime as needed for anxiety or sleep.) 30 tablet 5  . amLODipine (NORVASC) 10 MG tablet Take 1 tablet (10 mg total) by mouth daily. 90 tablet 2  . losartan (COZAAR) 100 MG tablet TAKE 1 TABLET BY MOUTH ONCE DAILY (Patient taking differently: Take 100 mg by mouth daily.) 90 tablet 2  . pantoprazole (PROTONIX) 40 MG tablet Take 1 tablet (40 mg total) by mouth daily. 90 tablet 3  . gabapentin (NEURONTIN) 300 MG capsule Take 1 capsule (300 mg total) by mouth 3 (three) times daily for 14 days. 42 capsule 0  . oxyCODONE-acetaminophen (PERCOCET/ROXICET) 5-325 MG tablet TAKE 1 TABLET BY MOUTH TWICE A DAY AS NEEDED 60 tablet 0   No facility-administered medications prior to visit.     Objective: Patient appears/sounds mildly ill.  They are in no apparent distress.  Breathing is non labored.  Mood and behavior are normal.  Laboratory Data:  Recent Results (from the past 2160 hour(s))  Lipid panel     Status: Abnormal   Collection Time: 10/07/20  4:04 PM  Result Value Ref Range   Cholesterol 176 0 - 200 mg/dL    Comment: ATP III Classification  Desirable:  < 200 mg/dL               Borderline High:  200 - 239 mg/dL          High:  > = 240 mg/dL   Triglycerides 273.0 (H) 0.0 - 149.0 mg/dL    Comment: Normal:  <150 mg/dLBorderline High:  150 - 199 mg/dL   HDL 51.10 >39.00 mg/dL   VLDL 54.6 (H) 0.0 - 40.0 mg/dL   Total CHOL/HDL Ratio 3     Comment:                Men          Women1/2 Average Risk     3.4          3.3Average Risk          5.0          4.42X Average Risk          9.6          7.13X Average Risk          15.0           11.0                       NonHDL 124.79     Comment: NOTE:  Non-HDL goal should be 30 mg/dL higher than patient's LDL goal (i.e. LDL goal of < 70 mg/dL, would have non-HDL goal of < 100 mg/dL)  Basic metabolic panel     Status: Abnormal   Collection Time: 10/07/20  4:04 PM  Result Value Ref Range   Sodium 139 135 - 145 mEq/L   Potassium 4.2 3.5 - 5.1 mEq/L   Chloride 100 96 - 112 mEq/L   CO2 32 19 - 32 mEq/L   Glucose, Bld 103 (H) 70 - 99 mg/dL   BUN 13 6 - 23 mg/dL   Creatinine, Ser 1.21 0.40 - 1.50 mg/dL   GFR 63.77 >60.00 mL/min    Comment: Calculated using the CKD-EPI Creatinine Equation (2021)   Calcium 9.9 8.4 - 10.5 mg/dL  Hemoglobin A1c     Status: None   Collection Time: 10/07/20  4:04 PM  Result Value Ref Range   Hgb A1c MFr Bld 5.6 4.6 - 6.5 %    Comment: Glycemic Control Guidelines for People with Diabetes:Non Diabetic:  <6%Goal of Therapy: <7%Additional Action Suggested:  >8%   Hepatic function panel     Status: None   Collection Time: 10/07/20  4:04 PM  Result Value Ref Range   Total Bilirubin 0.5 0.2 - 1.2 mg/dL   Bilirubin, Direct 0.1 0.0 - 0.3 mg/dL   Alkaline Phosphatase 93 39 - 117 U/L   AST 19 0 - 37 U/L   ALT 27 0 - 53 U/L   Total Protein 6.7 6.0 - 8.3 g/dL   Albumin 4.0 3.5 - 5.2 g/dL  CBC with Differential/Platelet     Status: None   Collection Time: 10/07/20  4:04 PM  Result Value Ref Range   WBC 6.3 4.0 - 10.5 K/uL   RBC 4.90 4.22 - 5.81 Mil/uL   Hemoglobin 16.6 13.0 - 17.0 g/dL   HCT 47.8 39.0 - 52.0 %   MCV 97.5 78.0 - 100.0 fl   MCHC 34.7 30.0 - 36.0 g/dL   RDW 12.6 11.5 - 15.5 %   Platelets 225.0 150.0 - 400.0 K/uL   Neutrophils Relative % 59.4 43.0 - 77.0 %  Lymphocytes Relative 29.0 12.0 - 46.0 %   Monocytes Relative 7.9 3.0 - 12.0 %   Eosinophils Relative 2.6 0.0 - 5.0 %   Basophils Relative 1.1 0.0 - 3.0 %   Neutro Abs 3.8 1.4 - 7.7 K/uL   Lymphs Abs 1.8 0.7 - 4.0 K/uL   Monocytes Absolute 0.5 0.1 - 1.0 K/uL   Eosinophils  Absolute 0.2 0.0 - 0.7 K/uL   Basophils Absolute 0.1 0.0 - 0.1 K/uL  TSH     Status: None   Collection Time: 10/07/20  4:04 PM  Result Value Ref Range   TSH 3.32 0.35 - 4.50 uIU/mL  Urinalysis, Routine w reflex microscopic     Status: Abnormal   Collection Time: 10/07/20  4:04 PM  Result Value Ref Range   Color, Urine YELLOW Yellow;Lt. Yellow;Straw;Dark Yellow;Amber;Green;Red;Brown   APPearance CLEAR Clear;Turbid;Slightly Cloudy;Cloudy   Specific Gravity, Urine <=1.005 (A) 1.000 - 1.030   pH 6.0 5.0 - 8.0   Total Protein, Urine NEGATIVE Negative   Urine Glucose NEGATIVE Negative   Ketones, ur NEGATIVE Negative   Bilirubin Urine NEGATIVE Negative   Hgb urine dipstick NEGATIVE Negative   Urobilinogen, UA 0.2 0.0 - 1.0   Leukocytes,Ua NEGATIVE Negative   Nitrite NEGATIVE Negative   WBC, UA none seen 0-2/hpf   RBC / HPF none seen 0-2/hpf   Squamous Epithelial / LPF Rare(0-4/hpf) Rare(0-4/hpf)  PSA     Status: None   Collection Time: 10/07/20  4:04 PM  Result Value Ref Range   PSA 0.33 0.10 - 4.00 ng/mL    Comment: Test performed using Access Hybritech PSA Assay, a parmagnetic partical, chemiluminecent immunoassay.  VITAMIN D 25 Hydroxy (Vit-D Deficiency, Fractures)     Status: None   Collection Time: 10/07/20  4:04 PM  Result Value Ref Range   VITD 30.86 30.00 - 100.00 ng/mL  Vitamin B12     Status: Abnormal   Collection Time: 10/07/20  4:04 PM  Result Value Ref Range   Vitamin B-12 143 (L) 211 - 911 pg/mL  LDL cholesterol, direct     Status: None   Collection Time: 10/07/20  4:04 PM  Result Value Ref Range   Direct LDL 90.0 mg/dL    Comment: Optimal:  <100 mg/dLNear or Above Optimal:  100-129 mg/dLBorderline High:  130-159 mg/dLHigh:  160-189 mg/dLVery High:  >190 mg/dL     Assessment: 65 y.o. male with mild/moderate COVID 19 viral infection diagnosed on 11/27/2020 at high risk for progression to severe COVID 19.  Plan:  This patient is a 65 y.o. male that meets the  following criteria for Emergency Use Authorization of: Paxlovid 1. Age >12 yr AND > 40 kg 2. SARS-COV-2 positive test 3. Symptom onset < 5 days 4. Mild-to-moderate COVID disease with high risk for severe progression to hospitalization or death  I have spoken and communicated the following to the patient or parent/caregiver regarding: 1. Paxlovid is an unapproved drug that is authorized for use under an Emergency Use Authorization.  2. There are no adequate, approved, available products for the treatment of COVID-19 in adults who have mild-to-moderate COVID-19 and are at high risk for progressing to severe COVID-19, including hospitalization or death. 3. Other therapeutics are currently authorized. For additional information on all products authorized for treatment or prevention of COVID-19, please see [email or web address].  4. There are benefits and risks of taking this treatment as outlined in the "Fact Sheet for Patients and Caregivers."  5. "Fact Sheet for Patients and Caregivers" was  reviewed with patient. A hard copy will be provided to patient from pharmacy prior to the patient receiving treatment. 6. Patients should continue to self-isolate and use infection control measures (e.g., wear mask, isolate, social distance, avoid sharing personal items, clean and disinfect "high touch" surfaces, and frequent handwashing) according to CDC guidelines.  7. The patient or parent/caregiver has the option to accept or refuse treatment. 8. Patient medication history was reviewed for potential drug interactions:Interaction with home meds: Xanax - patient has not taken it in 1 week and will continue to hold 9. Patient's creatinine clearance was calculated to be 78 mL/min, and they were therefore prescribed Normal dose (CrCl>60) - nirmatrelvir 150mg  tab (2 tablet) by mouth twice daily AND ritonavir 100mg  tab  (1 tablet) by mouth twice daily   After reviewing above information with the patient, the patient agrees to receive Paxlovid.  Follow up instructions:    . Take prescription BID x 5 days as directed . Reach out to pharmacist for counseling on medication if desired . For concerns regarding further COVID symptoms please follow up with your PCP or urgent care . For urgent or life-threatening issues, seek care at your local emergency department  The patient was provided an opportunity to ask questions, and all were answered. The patient agreed with the plan and demonstrated an understanding of the instructions.   Script sent to [place] and opted to pick up RX.  The patient was advised to call their PCP or seek an in-person evaluation if the symptoms worsen or if the condition fails to improve as anticipated.   I provided 20 minutes of non face-to-face telephone visit time during this encounter, and > 50% was spent counseling as documented under my assessment & plan.  Janene Madeira, NP 11/28/2020 /1:40 PM

## 2020-11-28 NOTE — Telephone Encounter (Signed)
Patient was prescribed oral covid treatment Paxlovid and treatment note was reviewed. Medication has been received by [place] and reviewed for appropriateness.  Drug Interactions or Dosage Adjustments Noted: Potential interaction with alprazolam. Patient will hold medication while taking Paxlovid.  Creatinine clearance calculated as 79 mL/min.  Delivery Method: Pick-up  Patient contacted for counseling on 11/28/20 and verbalized understanding.   Delivery or Pick-Up Date: 11/28/20   Carolynne Edouard 11/28/2020, 2:10 PM [facility] Outpatient Pharmacist Phone# [phone]

## 2020-12-02 NOTE — Telephone Encounter (Signed)
Wife states pt has done MAB & is doing better. Has no ques/concerns at this time.

## 2020-12-18 ENCOUNTER — Other Ambulatory Visit: Payer: Self-pay | Admitting: Internal Medicine

## 2020-12-18 NOTE — Telephone Encounter (Signed)
Done erx  pmp reviewed and no diversion

## 2020-12-22 ENCOUNTER — Telehealth: Payer: Self-pay | Admitting: Internal Medicine

## 2020-12-22 NOTE — Telephone Encounter (Signed)
These tests were all done Oct 07 2020 except the TB gold  Not sure why he would need them again, sorry

## 2020-12-22 NOTE — Telephone Encounter (Signed)
Patient called and was wondering if Dr. Jenny Reichmann would be able to put in lab orders. He is needing CBC Differential, CMPE, Liver function and a TB Gold. Please advise.

## 2020-12-23 ENCOUNTER — Telehealth: Payer: Self-pay

## 2020-12-23 NOTE — Telephone Encounter (Signed)
Left patient a vc with previous note

## 2021-01-19 ENCOUNTER — Other Ambulatory Visit: Payer: Self-pay | Admitting: Internal Medicine

## 2021-01-19 NOTE — Telephone Encounter (Signed)
Done erx   [email or web address] reviewed, no evidence for diversion

## 2021-02-01 ENCOUNTER — Encounter: Payer: Self-pay | Admitting: Internal Medicine

## 2021-03-19 ENCOUNTER — Telehealth: Payer: Self-pay | Admitting: Internal Medicine

## 2021-03-19 NOTE — Telephone Encounter (Signed)
OK to contact pt  Please remind pt I will no longer be able to prescribe the pain medication after July 1  Does the patient want to be referred to pain management to continue this? We are asking because it takes up to 90 days sometimes to complete the referral process 

## 2021-03-20 MED ORDER — ALPRAZOLAM 1 MG PO TABS: 1.0000 mg | ORAL_TABLET | Freq: Every evening | ORAL | 2 refills | Status: DC | PRN

## 2021-03-20 MED ORDER — LOSARTAN POTASSIUM 100 MG PO TABS: 1.0000 | ORAL_TABLET | Freq: Every day | ORAL | 0 refills | Status: DC

## 2021-03-20 NOTE — Addendum Note (Signed)
Addended by: Biagio Borg on: 03/20/2021 08:48 PM   Modules accepted: Orders

## 2021-06-29 ENCOUNTER — Other Ambulatory Visit: Payer: Self-pay | Admitting: Internal Medicine

## 2021-06-29 NOTE — Telephone Encounter (Signed)
Please to contact pt  Xanax refilled x 1 mo  LOV July 2021  Please for ROV for further refills

## 2021-07-28 ENCOUNTER — Other Ambulatory Visit: Payer: Self-pay | Admitting: Internal Medicine

## 2021-08-06 ENCOUNTER — Other Ambulatory Visit: Payer: Self-pay

## 2021-08-06 ENCOUNTER — Encounter: Payer: Self-pay | Admitting: Nurse Practitioner

## 2021-08-06 ENCOUNTER — Telehealth (INDEPENDENT_AMBULATORY_CARE_PROVIDER_SITE_OTHER): Payer: 59 | Admitting: Nurse Practitioner

## 2021-08-06 VITALS — BP 130/80

## 2021-08-06 DIAGNOSIS — U071 COVID-19: Secondary | ICD-10-CM

## 2021-08-06 MED ORDER — NIRMATRELVIR/RITONAVIR (PAXLOVID)TABLET: 3.0000 | ORAL_TABLET | Freq: Two times a day (BID) | ORAL | 0 refills | Status: AC

## 2021-08-06 NOTE — Progress Notes (Signed)
Due to national recommendations of social distancing related to the [place] pandemic, an audio-only tele-health visit was felt to be the most appropriate encounter type for this patient today. I connected with  Randy Walker on 08/06/21 utilizing audio-only technology and verified that I am speaking with the correct person using two identifiers. The patient was located at their home, and I was located at the office of [place] at [facility] during the encounter. I discussed the limitations of evaluation and management by telemedicine. The patient expressed understanding and agreed to proceed.    Subjective:  Patient ID: Randy Walker, male    DOB: 1956-05-16  Age: 65 y.o. MRN: [id number]  CC:  Chief Complaint  Patient presents with   Covid 19       HPI  This patient arrives today for the above.  He tells me he started having mild symptoms yesterday and tested at home today and was positive for COVID-19 infection.  He tells me he was in [place] and did have known exposure to COVID-19 infection.  He tells me generally his symptoms are mild but he is experiencing fever and chills, as well as dry cough.  He does report some mild shortness of breath with exertion but does not feel he is having any significantly difficult time breathing. He is on humira, recently diagnosed and treated for renal cancer (09/2020) follows-up with Dr. Tammi Klippel. He is concerned about his immunosuppression. Has taken Paxlovid in January 2022 for Covid 19 infection.    Past Medical History:  Diagnosis Date   Adenomatous polyps 11/17/2011   Alcohol use 11/17/2011   ALLERGIC RHINITIS 09/10/2007   ANXIETY 09/10/2007   BACK PAIN 12/31/2010   [place] DISEASE, LUMBAR 12/31/2010   GERD (gastroesophageal reflux disease) 11/23/2016   Hyperglycemia 11/23/2016   HYPERLIPIDEMIA 12/31/2010   Hypertension    Personal history of rectal adenomas 01/29/2008   PSORIASIS 12/31/2010   Psoriatic arthritis ([place])    PULMONARY  EMBOLISM, HX OF 09/10/2007   RASH-NONVESICULAR 04/23/2009   Renal mass    SMOKER 12/31/2010      Family History  Problem Relation Age of Onset   Colon cancer Neg Hx    Esophageal cancer Neg Hx    Rectal cancer Neg Hx    Stomach cancer Neg Hx     Social History   Social History Narrative   Not on file   Social History   Tobacco Use   Smoking status: Every Day    Packs/day: 0.50    Types: Cigarettes   Smokeless tobacco: Never   Tobacco comments:    Restarted in March 2021  Substance Use Topics   Alcohol use: Yes    Comment: 4 [place] daily     Current Meds  Medication Sig   Adalimumab 40 MG/0.4ML PSKT Inject 40 mg into the skin every 14 (fourteen) days.   nirmatrelvir/ritonavir EUA (PAXLOVID) 20 x 150 MG & 10 x '100MG'$  TABS Take 3 tablets by mouth 2 (two) times daily for 5 days. (Take nirmatrelvir 150 mg two tablets twice daily for 5 days and ritonavir 100 mg one tablet twice daily for 5 days) Patient GFR is 89 (per Dr. Johny Shears office - in July 2022)    ROS:  Review of Systems  Constitutional:  Positive for chills, fever and malaise/fatigue.  Respiratory:  Positive for cough, shortness of breath (with exertion) and wheezing.   Cardiovascular:  Negative for chest pain.  Gastrointestinal:  Negative for abdominal pain,  diarrhea, nausea and vomiting.  Musculoskeletal:  Negative for myalgias.  Neurological:  Positive for headaches. Negative for dizziness.    Objective:   Today's Vitals: BP 130/80  Vitals with BMI 08/06/2021 08/29/2020 08/28/2020  Height - - -  Weight - - -  BMI - - -  Systolic [id number]  Diastolic 80 93 98  Pulse - 77 77     Physical Exam Comprehensive physical exam not completed today as office visit was conducted remotely.  Patient sounded well over the phone. He did cough, but did not need to stop talking due to shortness of breath.  Patient was alert and oriented, and appeared to have appropriate judgment.       Assessment and Plan    1. COVID-19      Plan: Called Dr. Johny Shears office to verify last GFR, per triage nurse there his last GFR was collected 06/08/2021 and was 89.9.  We will prescribe paxlovid for patient.  We did discuss that it can interact with amlodipine, patient tells me he will probably stop taking amlodipine while on the paxlovid. He was encouraged to monitor blood pressure closely if he does this and consider taking 1/2 tablet by mouth daily while on paxlovid.  He was instructed to present to the emergency department if symptoms worsen especially if shortness of breath worsens.  I recommended he get up and walk around the house a couple times a day for pneumonia prevention.  Also recommended he quarantine at home for at least the next 4 days and if he goes out in public after that time to wear a mask for a total of 10 days from symptom onset.  He tells me he understands.  Tests ordered No orders of the defined types were placed in this encounter.     Meds ordered this encounter  Medications   nirmatrelvir/ritonavir EUA (PAXLOVID) 20 x 150 MG & 10 x '100MG'$  TABS    Sig: Take 3 tablets by mouth 2 (two) times daily for 5 days. (Take nirmatrelvir 150 mg two tablets twice daily for 5 days and ritonavir 100 mg one tablet twice daily for 5 days) Patient GFR is 89 (per Dr. Johny Shears office - in July 2022)    Dispense:  30 tablet    Refill:  0    Order Specific Question:   Supervising Provider    Answer:   Binnie Rail [id number]     Patient to follow-up when he is due for his annual exam with his primary care provider, or sooner as needed.  Total time spent on the telephone today with this patient was 22 minutes and 9 seconds.  Ailene Ards, NP

## 2021-08-27 ENCOUNTER — Other Ambulatory Visit: Payer: Self-pay | Admitting: Internal Medicine

## 2021-08-31 ENCOUNTER — Other Ambulatory Visit: Payer: Self-pay | Admitting: Internal Medicine

## 2021-08-31 NOTE — Telephone Encounter (Signed)
Sorry, due to office refill policy, we are unable to further refill medicaitons

## 2021-09-07 ENCOUNTER — Other Ambulatory Visit: Payer: Self-pay | Admitting: Internal Medicine

## 2021-09-07 DIAGNOSIS — E785 Hyperlipidemia, unspecified: Secondary | ICD-10-CM

## 2021-09-14 ENCOUNTER — Ambulatory Visit (INDEPENDENT_AMBULATORY_CARE_PROVIDER_SITE_OTHER)
Admission: RE | Admit: 2021-09-14 | Discharge: 2021-09-14 | Disposition: A | Discharge status: Home / Self Care | Payer: Self-pay | Source: Ambulatory Visit | Attending: Internal Medicine | Admitting: Internal Medicine

## 2021-09-14 ENCOUNTER — Other Ambulatory Visit: Payer: Self-pay

## 2021-09-14 DIAGNOSIS — E785 Hyperlipidemia, unspecified: Secondary | ICD-10-CM

## 2021-09-15 ENCOUNTER — Other Ambulatory Visit: Payer: Self-pay | Admitting: Internal Medicine

## 2021-09-15 ENCOUNTER — Encounter: Payer: Self-pay | Admitting: Internal Medicine

## 2021-09-15 DIAGNOSIS — I77810 Thoracic aortic ectasia: Secondary | ICD-10-CM

## 2021-10-05 ENCOUNTER — Other Ambulatory Visit: Payer: Self-pay | Admitting: Internal Medicine

## 2021-10-05 NOTE — Telephone Encounter (Signed)
Please refill as per office routine med refill policy (all routine meds to be refilled for 3 mo or monthly (per pt preference) up to one year from last visit, then month to month grace period for 3 mo, then further med refills will have to be denied) ? ?

## 2021-10-06 ENCOUNTER — Other Ambulatory Visit: Payer: Self-pay

## 2021-10-06 ENCOUNTER — Ambulatory Visit (INDEPENDENT_AMBULATORY_CARE_PROVIDER_SITE_OTHER): Payer: 59 | Admitting: Cardiovascular Disease

## 2021-10-06 ENCOUNTER — Encounter: Payer: Self-pay | Admitting: Cardiovascular Disease

## 2021-10-06 VITALS — BP 100/80 | HR 115 | Ht 74.0 in | Wt 192.5 lb

## 2021-10-06 DIAGNOSIS — R0602 Shortness of breath: Secondary | ICD-10-CM

## 2021-10-06 DIAGNOSIS — I7121 Aneurysm of the ascending aorta, without rupture: Secondary | ICD-10-CM

## 2021-10-06 DIAGNOSIS — Z720 Tobacco use: Secondary | ICD-10-CM

## 2021-10-06 DIAGNOSIS — I10 Essential (primary) hypertension: Secondary | ICD-10-CM

## 2021-10-06 DIAGNOSIS — E785 Hyperlipidemia, unspecified: Secondary | ICD-10-CM

## 2021-10-06 MED ORDER — LOSARTAN POTASSIUM 50 MG PO TABS: 50.0000 mg | ORAL_TABLET | Freq: Every day | ORAL | 1 refills | Status: DC

## 2021-10-06 MED ORDER — PANTOPRAZOLE SODIUM 40 MG PO TBEC: 40.0000 mg | DELAYED_RELEASE_TABLET | Freq: Every day | ORAL | 1 refills | Status: DC

## 2021-10-06 NOTE — Progress Notes (Signed)
Cardiology Office Note   Date:  10/06/2021   ID:  Randy Walker, DOB [birth year], MRN [id number]  PCP:  Biagio Borg, MD  Cardiologist:   [name], MD   Chief Complaint  Patient presents with   Other    Ascending aorta dilatation c/o 'chest cramps'. Meds reviewed verbally with pt.      History of Present Illness: Randy Walker is a 65 y.o. male who was referred by Dr. Jenny Walker for evaluation of mildly dilated ascending aorta. 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  He had CT calcium score done last month which showed a calcium score of 29.  The ascending aorta was mildly dilated at 42 mm. He reports exertional dyspnea.  He has occasional episodes of chest pain that is described as heartburn and usually responds to Protonix.  He has no exertional chest pain.  There is no family history of aortic aneurysms.  His father died at the age of 76 of myocardial infarction.  The patient smokes 10 cigarettes/day and has been doing so since he was a teenager.  He drinks 4 beers a day.  He is a retired Merchandiser, retail. He is noted to be mildly tachycardic today but he denies palpitations.  He had COVID-19 infection in September but recovered. He ran out of amlodipine recently and has been taking his wife's losartan.  Past Medical History:  Diagnosis Date   Adenomatous polyps 11/17/2011   Alcohol use 11/17/2011   ALLERGIC RHINITIS 09/10/2007   ANXIETY 09/10/2007   BACK PAIN 12/31/2010   [place] DISEASE, LUMBAR 12/31/2010   GERD (gastroesophageal reflux disease) 11/23/2016   Hyperglycemia 11/23/2016   HYPERLIPIDEMIA 12/31/2010   Hypertension    Personal history of rectal adenomas 01/29/2008   PSORIASIS 12/31/2010   Psoriatic arthritis ([place])    PULMONARY EMBOLISM, HX OF 09/10/2007   RASH-NONVESICULAR 04/23/2009   Renal mass    SMOKER 12/31/2010    Past Surgical History:   Procedure Laterality Date   LYMPH NODE DISSECTION Bilateral 08/27/2020   Procedure: RETROPERITONEAL LYMPH NODE DISSECTION;  Surgeon: Alexis Frock, MD;  Location: [facility];  Service: Urology;  Laterality: Bilateral;   ROBOT ASSISTED LAPAROSCOPIC NEPHRECTOMY Left 08/27/2020   Procedure: XI ROBOTIC ASSISTED LAPAROSCOPIC RADICAL NEPHRECTOMY;  Surgeon: Alexis Frock, MD;  Location: [facility];  Service: Urology;  Laterality: Left;  3.5 HRS   ROTATOR CUFF REPAIR  Jan, 2010   [facility], Left   spleen repair     TOTAL HIP ARTHROPLASTY Left 09/09/2017   Procedure: LEFT TOTAL HIP ARTHROPLASTY ANTERIOR APPROACH;  Surgeon: Dorna Leitz, MD;  Location: [facility];  Service: Orthopedics;  Laterality: Left;   WISDOM TOOTH EXTRACTION       Current Outpatient Medications  Medication Sig Dispense Refill   losartan (COZAAR) 100 MG tablet Take 100 mg by mouth daily.     pantoprazole (PROTONIX) 40 MG tablet Take 40 mg by mouth daily.     No current facility-administered medications for this visit.    Allergies:   Patient has no known allergies.    Social History:  The patient  reports that he has been smoking cigarettes. He has a 20.00 pack-year smoking history. He has never used smokeless tobacco. He reports current alcohol use. He reports that he does not use drugs.   Family History:  The patient's family history includes Heart attack in his father.  ROS:  Please see the history of present illness.   Otherwise, review of systems are positive for none.   All other systems are reviewed and negative.    PHYSICAL EXAM: VS:  BP 100/80 (BP Location: Right Arm, Patient Position: Sitting, Cuff Size: Normal)   Pulse (!) 115   Ht 6\' 2"  (1.88 m)   Wt 192 lb 8 oz (87.3 kg)   SpO2 98%   BMI 24.72 kg/m  , BMI Body mass index is 24.72 kg/m. GEN: Well nourished, well developed, in no acute distress  HEENT: normal  Neck: no JVD, carotid bruits, or masses Cardiac: RRR; no murmurs, rubs, or gallops,no edema   Respiratory:  clear to auscultation bilaterally, normal work of breathing GI: soft, nontender, nondistended, + BS MS: no deformity or atrophy  Skin: warm and dry, no rash Neuro:  Strength and sensation are intact Psych: euthymic mood, full affect   EKG:  EKG is ordered today. The ekg ordered today demonstrates sinus tachycardia with left anterior fascicular block.   Recent Labs: 10/07/2020: ALT 27; BUN 13; Creatinine, Ser 1.21; Hemoglobin 16.6; Platelets 225.0; Potassium 4.2; Sodium 139; TSH 3.32    Lipid Panel    Component Value Date/Time   CHOL 176 10/07/2020 1604   TRIG 273.0 (H) 10/07/2020 1604   HDL 51.10 10/07/2020 1604   CHOLHDL 3 10/07/2020 1604   VLDL 54.6 (H) 10/07/2020 1604   LDLCALC 86 05/28/2020 0812   LDLDIRECT 90.0 10/07/2020 1604      Wt Readings from Last 3 Encounters:  10/06/21 192 lb 8 oz (87.3 kg)  08/27/20 198 lb (89.8 kg)  08/20/20 207 lb 11.2 oz (94.2 kg)       PAD Screen 10/06/2021  Previous PAD dx? No  Previous surgical procedure? Yes  Pain with walking? No  Feet/toe relief with dangling? No  Painful, non-healing ulcers? No  Extremities discolored? No      ASSESSMENT AND PLAN:  1.  Mildly dilated ascending aorta: This measured 42 mm by noncontrast CT.  I explained to him the rationale for periodic monitoring and recommend CTA of the chest with contrast in 6 months unless we can visualize this on  the upcoming echocardiogram.  2.  Exertional dyspnea: He is mildly tachycardic today.  I requested an echocardiogram to ensure no structural heart abnormalities or cardiomyopathy.  3.  Mildly elevated coronary calcium score: Convincing evidence of angina at the present time.  Continue medical therapy.  4.   Hyperlipidemia: I requested a follow-up lipid profile to see if treatment is needed.  5/.  Tobacco use: Discussed the importance of smoking cessation.  6.  Sinus tachycardia: I requested routine labs today including CBC, basic  metabolic profile and TSH.  In addition, I will obtain an echocardiogram.  7.  Essential hypertension: His blood pressure seems to be on the low side.  I decreased losartan to 50 mg daily.  No need to resume amlodipine at the present time given low blood pressure.   Disposition:   FU with me in 3 months  Signed,  [name], MD  10/06/2021 2:21 PM    [place]

## 2021-10-06 NOTE — Patient Instructions (Signed)
Medication Instructions:  Your physician has recommended you make the following change in your medication:   REDUCE Losartan to 50 mg daily. An Rx has been sent to your pharmacy.  Protonix has been refilled today.   *If you need a refill on your cardiac medications before your next appointment, please call your pharmacy*   Lab Work: Cmp, Cbc, Tsh, Lipid today  If you have labs (blood work) drawn today and your tests are completely normal, you will receive your results only by: [place] (if you have MyChart) OR A paper copy in the mail If you have any lab test that is abnormal or we need to change your treatment, we will call you to review the results.   Testing/Procedures: Your physician has requested that you have an echocardiogram. Echocardiography is a painless test that uses sound waves to create images of your heart. It provides your doctor with information about the size and shape of your heart and how well your heart's chambers and valves are working. This procedure takes approximately one hour. There are no restrictions for this procedure.    Follow-Up: At [facility], you and your health needs are our priority.  As part of our continuing mission to provide you with exceptional heart care, we have created designated Provider Care Teams.  These Care Teams include your primary Cardiologist (physician) and Advanced Practice Providers (APPs -  Physician Assistants and Nurse Practitioners) who all work together to provide you with the care you need, when you need it.  We recommend signing up for the patient portal called "MyChart".  Sign up information is provided on this After Visit Summary.  MyChart is used to connect with patients for Virtual Visits (Telemedicine).  Patients are able to view lab/test results, encounter notes, upcoming appointments, etc.  Non-urgent messages can be sent to your provider as well.   To learn more about what you can do with MyChart, go to  [email or web address].    Your next appointment:   3 month(s)  The format for your next appointment:   In Person  Provider:   You may see [name], MD or one of the following Advanced Practice Providers on your designated Care Team:   Murray Hodgkins, NP Christell Faith, PA-C Cadence Kathlen Mody, [place]    Other Instructions N/A

## 2021-10-07 ENCOUNTER — Other Ambulatory Visit: Payer: Self-pay | Admitting: Internal Medicine

## 2021-10-07 ENCOUNTER — Encounter: Payer: Self-pay | Admitting: Internal Medicine

## 2021-10-07 ENCOUNTER — Telehealth: Payer: Self-pay | Admitting: Internal Medicine

## 2021-10-07 DIAGNOSIS — D751 Secondary polycythemia: Secondary | ICD-10-CM

## 2021-10-07 LAB — COMPREHENSIVE METABOLIC PANEL
ALT: 39 IU/L (ref 0–44)
AST: 34 IU/L (ref 0–40)
Albumin/Globulin Ratio: 1.6 (ref 1.2–2.2)
Albumin: 4.4 g/dL (ref 3.8–4.8)
Alkaline Phosphatase: 112 IU/L (ref 44–121)
BUN/Creatinine Ratio: 8 — ABNORMAL LOW (ref 10–24)
BUN: 7 mg/dL — ABNORMAL LOW (ref 8–27)
Bilirubin Total: 0.5 mg/dL (ref 0.0–1.2)
CO2: 18 mmol/L — ABNORMAL LOW (ref 20–29)
Calcium: 9.2 mg/dL (ref 8.6–10.2)
Chloride: 97 mmol/L (ref 96–106)
Creatinine, Ser: 0.9 mg/dL (ref 0.76–1.27)
Globulin, Total: 2.7 g/dL (ref 1.5–4.5)
Glucose: 77 mg/dL (ref 70–99)
Potassium: 4.1 mmol/L (ref 3.5–5.2)
Sodium: 135 mmol/L (ref 134–144)
Total Protein: 7.1 g/dL (ref 6.0–8.5)
eGFR: 95 mL/min/{1.73_m2} (ref >59)

## 2021-10-07 LAB — CBC WITH DIFFERENTIAL/PLATELET
Basophils Absolute: 0.1 10*3/uL (ref 0.0–0.2)
Basos: 1 %
EOS (ABSOLUTE): 0.1 10*3/uL (ref 0.0–0.4)
Eos: 2 %
Hematocrit: 51.2 % — ABNORMAL HIGH (ref 37.5–51.0)
Hemoglobin: 17.9 g/dL — ABNORMAL HIGH (ref 13.0–17.7)
Immature Grans (Abs): 0 10*3/uL (ref 0.0–0.1)
Immature Granulocytes: 1 %
Lymphocytes Absolute: 1.7 10*3/uL (ref 0.7–3.1)
Lymphs: 28 %
MCH: 34.5 pg — ABNORMAL HIGH (ref 26.6–33.0)
MCHC: 35 g/dL (ref 31.5–35.7)
MCV: 99 fL — ABNORMAL HIGH (ref 79–97)
Monocytes Absolute: 0.5 10*3/uL (ref 0.1–0.9)
Monocytes: 9 %
Neutrophils Absolute: 3.7 10*3/uL (ref 1.4–7.0)
Neutrophils: 59 %
Platelets: 164 10*3/uL (ref 150–450)
RBC: 5.19 x10E6/uL (ref 4.14–5.80)
RDW: 12.5 % (ref 11.6–15.4)
WBC: 6.1 10*3/uL (ref 3.4–10.8)

## 2021-10-07 LAB — LIPID PANEL
Chol/HDL Ratio: 3.1 ratio (ref 0.0–5.0)
Cholesterol, Total: 170 mg/dL (ref 100–199)
HDL: 55 mg/dL (ref >39)
LDL Chol Calc (NIH): 93 mg/dL (ref 0–99)
Triglycerides: 127 mg/dL (ref 0–149)
VLDL Cholesterol Cal: 22 mg/dL (ref 5–40)

## 2021-10-07 LAB — TSH: TSH: 2.31 u[IU]/mL (ref 0.450–4.500)

## 2021-10-07 NOTE — Telephone Encounter (Signed)
Ok to contact pt  Please to let pt know, his recent Hgb was 17.9, and the reason is not clear, but may or may not be serious  Some possible causes might include :  Lab error Mild low volume (dehydration) Smoking more lately  New worsening sleep apnea Bone marrow problem such as even a type of blood cancer  But the first thing is to simply repeat the blood test  Please plan to go to the [facility] in about 1 wk to simply repeat the test after drinking some increased fluids for the time being  Staff to please inform pt, I will do order for repeat cbc

## 2021-10-08 NOTE — Telephone Encounter (Signed)
Patient notified

## 2021-11-12 ENCOUNTER — Ambulatory Visit (INDEPENDENT_AMBULATORY_CARE_PROVIDER_SITE_OTHER): Payer: 59

## 2021-11-12 ENCOUNTER — Other Ambulatory Visit: Payer: Self-pay

## 2021-11-12 ENCOUNTER — Telehealth: Payer: Self-pay | Admitting: Cardiovascular Disease

## 2021-11-12 DIAGNOSIS — R0602 Shortness of breath: Secondary | ICD-10-CM

## 2021-11-12 LAB — ECHOCARDIOGRAM COMPLETE
AR max vel: 3.62 cm2
AV Area VTI: 3.78 cm2
AV Area mean vel: 3.45 cm2
AV Mean grad: 4 mmHg
AV Peak grad: 6.5 mmHg
AV Vena cont: 0.45 cm
Ao pk vel: 1.27 m/s
Area-P 1/2: 3.39 cm2
Calc EF: 44.7 %
P 1/2 time: 691 msec
S' Lateral: 3.7 cm
Single Plane A2C EF: 40.8 %
Single Plane A4C EF: 48.3 %

## 2021-11-12 MED ORDER — PERFLUTREN LIPID MICROSPHERE
1.0000 mL | INTRAVENOUS | Status: AC | PRN
  Administered 2021-11-12: 2 mL via INTRAVENOUS

## 2021-11-12 NOTE — Telephone Encounter (Signed)
Patient in office for ECHO States that he had bloodwork done and his hemoglobin was high Would like to know if we can do a repeat blood draw today Please advise

## 2021-11-12 NOTE — Telephone Encounter (Signed)
Patient here having echocardiogram done. Labs were from November and he was advised to follow up with his primary care provider. They had reached out to schedule repeat labs. He should contact their office for further testing and management.

## 2021-11-13 ENCOUNTER — Telehealth: Payer: Self-pay

## 2021-11-13 NOTE — Telephone Encounter (Signed)
-----   Message from Wellington Hampshire, MD sent at 11/13/2021  2:19 PM EST ----- Inform patient that echo showed mildly reduced LV systolic function with mildly dilated ascending aorta.  Add Toprol 25 mg once daily and schedule a follow-up appointment in the next few weeks as he might require a right and left cardiac catheterization to evaluate his cardiomyopathy.  Cc: Dr. Jenny Reichmann

## 2021-11-13 NOTE — Telephone Encounter (Signed)
Attempted to call patient and reached a VM that was full and was unable to leave a message. Will try again at a later time.

## 2021-11-20 MED ORDER — METOPROLOL SUCCINATE ER 25 MG PO TB24: 25.0000 mg | ORAL_TABLET | Freq: Every day | ORAL | 3 refills | Status: DC

## 2021-11-20 NOTE — Telephone Encounter (Signed)
Results and recommendations reviewed with patient. Scheduled appointment for him to come in and see Dr. Fletcher Anon per his request. Patient verbalized understanding of our conversation with no further questions at this time.

## 2021-11-20 NOTE — Telephone Encounter (Signed)
-----   Message from Wellington Hampshire, MD sent at 11/13/2021  2:19 PM EST ----- Inform patient that echo showed mildly reduced LV systolic function with mildly dilated ascending aorta.  Add Toprol 25 mg once daily and schedule a follow-up appointment in the next few weeks as he might require a right and left cardiac catheterization to evaluate his cardiomyopathy.  Cc: Dr. Jenny Reichmann

## 2021-11-20 NOTE — Addendum Note (Signed)
Addended by: Valora Corporal on: 11/20/2021 10:13 AM   Modules accepted: Orders

## 2021-12-11 ENCOUNTER — Other Ambulatory Visit: Payer: Self-pay

## 2021-12-11 ENCOUNTER — Ambulatory Visit (INDEPENDENT_AMBULATORY_CARE_PROVIDER_SITE_OTHER): Payer: 59 | Admitting: Cardiovascular Disease

## 2021-12-11 ENCOUNTER — Encounter: Payer: Self-pay | Admitting: Cardiovascular Disease

## 2021-12-11 VITALS — BP 140/78 | HR 102 | Ht 74.0 in | Wt 194.1 lb

## 2021-12-11 DIAGNOSIS — R000 Tachycardia, unspecified: Secondary | ICD-10-CM

## 2021-12-11 DIAGNOSIS — I10 Essential (primary) hypertension: Secondary | ICD-10-CM | POA: Diagnosis not present

## 2021-12-11 DIAGNOSIS — E785 Hyperlipidemia, unspecified: Secondary | ICD-10-CM

## 2021-12-11 DIAGNOSIS — I5022 Chronic systolic (congestive) heart failure: Secondary | ICD-10-CM

## 2021-12-11 DIAGNOSIS — I7121 Aneurysm of the ascending aorta, without rupture: Secondary | ICD-10-CM

## 2021-12-11 MED ORDER — METOPROLOL SUCCINATE ER 50 MG PO TB24: 50.0000 mg | ORAL_TABLET | Freq: Every day | ORAL | 1 refills | Status: DC

## 2021-12-11 NOTE — Patient Instructions (Signed)
Medication Instructions:  Your physician has recommended you make the following change in your medication:   INCREASE Metoprolol to 50 mg daily. An Rx has been sent to your pharmacy.   *If you need a refill on your cardiac medications before your next appointment, please call your pharmacy*   Lab Work: Bmp and Cbc today  If you have labs (blood work) drawn today and your tests are completely normal, you will receive your results only by: [place] (if you have MyChart) OR A paper copy in the mail If you have any lab test that is abnormal or we need to change your treatment, we will call you to review the results.   Testing/Procedures: Your physician has requested that you have a cardiac catheterization. Cardiac catheterization is used to diagnose and/or treat various heart conditions. Doctors may recommend this procedure for a number of different reasons. The most common reason is to evaluate chest pain. Chest pain can be a symptom of coronary artery disease (CAD), and cardiac catheterization can show whether plaque is narrowing or blocking your hearts arteries. This procedure is also used to evaluate the valves, as well as measure the blood flow and oxygen levels in different parts of your heart. For further information please visit [email or web address]. Please follow instruction sheet, as given.    Follow-Up: At [facility], you and your health needs are our priority.  As part of our continuing mission to provide you with exceptional heart care, we have created designated Provider Care Teams.  These Care Teams include your primary Cardiologist (physician) and Advanced Practice Providers (APPs -  Physician Assistants and Nurse Practitioners) who all work together to provide you with the care you need, when you need it.  We recommend signing up for the patient portal called "MyChart".  Sign up information is provided on this After Visit Summary.  MyChart is used to connect with  patients for Virtual Visits (Telemedicine).  Patients are able to view lab/test results, encounter notes, upcoming appointments, etc.  Non-urgent messages can be sent to your provider as well.   To learn more about what you can do with MyChart, go to [email or web address].    Your next appointment:   4 week(s)  The format for your next appointment:   In Person  Provider:   You may see Randy Sacramento, MD or one of the following Advanced Practice Providers on your designated Care Team:   Murray Hodgkins, NP Christell Faith, PA-C Cadence Kathlen Mody, [place]    Other Instructions   [place] [address] [place] [address] Dept: [phone] Loc: [place]  12/11/2021  You are scheduled for a Cardiac Catheterization on Tuesday, January 24 with Dr. Kathlyn Walker.  1. Please arrive at the [facility] [place], [place] [address] at 12:30 PM (This time is one hour before your procedure to ensure your preparation). Free valet parking service is available.   Special note: Every effort is made to have your procedure done on time. Please understand that emergencies sometimes delay scheduled procedures.  2. Diet: Do not eat solid foods after midnight.  The patient may have clear liquids until 5am upon the day of the procedure.  3. Labs: You will need to have blood drawn today (bmp, cbc ) You do not need to be fasting.  4. Medication instructions in preparation for your procedure:   Contrast Allergy: No  On the morning of your procedure, take your Aspirin 81 mg and any morning medicines NOT listed above.  You may use sips of water.  5. Plan for one night stay--bring personal belongings. 6. Bring a current list of your medications and current insurance cards. 7. You MUST have a responsible person to drive you home. 8. Someone MUST be with you the first  24 hours after you arrive home or your discharge will be delayed. 9. Please wear clothes that are easy to get on and off and wear slip-on shoes.  Thank you for allowing [place] to care for you!   -- [facility]

## 2021-12-11 NOTE — H&P (View-Only) (Signed)
Cardiology Office Note   Date:  12/11/2021   ID:  Randy Walker, MRN [id number]  PCP:  Biagio Borg, MD  Cardiologist:   [name], MD   Chief Complaint  Patient presents with   Other    F/u echo no complaints.  Meds reviewed verbally with pt.      History of Present Illness: Randy Walker is a 66 y.o. male who is here today for follow-up visit regarding  mildly dilated ascending aorta. 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 There is no family history of aortic aneurysms.  His father died at the age of 37 of myocardial infarction.  The patient smokes 10 cigarettes/day and has been doing so since he was a teenager.  He drinks 4 beers a day.  He is a retired Merchandiser, retail. He had CT calcium score in October 2022  which showed a calcium score of 29.  The ascending aorta was mildly dilated at 42 mm. During his initial evaluation, he reported occasional chest pain thought to be GI in nature with mild exertional dyspnea.  He was noted to have sinus tachycardia.  I requested routine labs which overall were unremarkable.  His hemoglobin was mildly elevated likely due to tobacco use. He had an echocardiogram done in December which showed an EF of 40 to 45% with grade 2 diastolic dysfunction, mild mitral regurgitation, mild to moderate aortic regurgitation and moderately dilated ascending aorta at 44 mm.  Based on this, I added Toprol 25 mg once daily.  He reports exertional dyspnea but no chest pain.  No significant lower extremity edema. He reports uncontrolled anxiety symptoms.  Past Medical History:  Diagnosis Date   Adenomatous polyps 11/17/2011   Alcohol use 11/17/2011   ALLERGIC RHINITIS 09/10/2007   ANXIETY 09/10/2007   BACK PAIN 12/31/2010   [place] DISEASE, LUMBAR 12/31/2010   GERD (gastroesophageal reflux disease) 11/23/2016    Hyperglycemia 11/23/2016   HYPERLIPIDEMIA 12/31/2010   Hypertension    Personal history of rectal adenomas 01/29/2008   PSORIASIS 12/31/2010   Psoriatic arthritis ([place])    PULMONARY EMBOLISM, HX OF 09/10/2007   RASH-NONVESICULAR 04/23/2009   Renal mass    SMOKER 12/31/2010    Past Surgical History:  Procedure Laterality Date   LYMPH NODE DISSECTION Bilateral 08/27/2020   Procedure: RETROPERITONEAL LYMPH NODE DISSECTION;  Surgeon: Alexis Frock, MD;  [facility];  Service: Urology;  Laterality: Bilateral;   ROBOT ASSISTED LAPAROSCOPIC NEPHRECTOMY Left 08/27/2020   Procedure: XI ROBOTIC ASSISTED LAPAROSCOPIC RADICAL NEPHRECTOMY;  Surgeon: Alexis Frock, MD;  Location: [facility];  Service: Urology;  Laterality: Left;  3.5 HRS   ROTATOR CUFF REPAIR  Jan, 2010   [facility], Left   spleen repair     TOTAL HIP ARTHROPLASTY Left 09/09/2017   Procedure: LEFT TOTAL HIP ARTHROPLASTY ANTERIOR APPROACH;  Surgeon: Dorna Leitz, MD;  Location: [facility];  Service: Orthopedics;  Laterality: Left;   WISDOM TOOTH EXTRACTION       Current Outpatient Medications  Medication Sig Dispense Refill   losartan (COZAAR) 50 MG tablet Take 1 tablet (50 mg total) by mouth daily. 90 tablet 1   metoprolol succinate (TOPROL XL) 25 MG 24 hr tablet Take 1 tablet (25 mg total) by mouth daily. 90 tablet 3   pantoprazole (PROTONIX) 40 MG tablet Take 1 tablet (40 mg total) by mouth daily. 90 tablet  1   HUMIRA PEN 40 MG/0.4ML PNKT every 14 (fourteen) days.     No current facility-administered medications for this visit.    Allergies:   Patient has no known allergies.    Social History:  The patient  reports that he has been smoking cigarettes. He has a 20.00 pack-year smoking history. He has never used smokeless tobacco. He reports current alcohol use. He reports that he does not use drugs.   Family History:  The patient's family history includes Heart attack in his father.    ROS:  Please see the history of present  illness.   Otherwise, review of systems are positive for none.   All other systems are reviewed and negative.    PHYSICAL EXAM: VS:  BP 140/78 (BP Location: Left Arm, Patient Position: Sitting, Cuff Size: Normal)    Pulse (!) 102    Ht 6\' 2"  (1.88 m)    Wt 194 lb 2 oz (88.1 kg)    SpO2 96%    BMI 24.92 kg/m  , BMI Body mass index is 24.92 kg/m. GEN: Well nourished, well developed, in no acute distress  HEENT: normal  Neck: no JVD, carotid bruits, or masses Cardiac: RRR; no murmurs, rubs, or gallops,no edema  Respiratory:  clear to auscultation bilaterally, normal work of breathing GI: soft, nontender, nondistended, + BS MS: no deformity or atrophy  Skin: warm and dry, no rash Neuro:  Strength and sensation are intact Psych: euthymic mood, full affect   EKG:  EKG is ordered today. The ekg ordered today demonstrates sinus tachycardia with left anterior fascicular block.   Recent Labs: 10/06/2021: ALT 39; BUN 7; Creatinine, Ser 0.90; Hemoglobin 17.9; Platelets 164; Potassium 4.1; Sodium 135; TSH 2.310    Lipid Panel    Component Value Date/Time   CHOL 170 10/06/2021 1443   TRIG 127 10/06/2021 1443   HDL 55 10/06/2021 1443   CHOLHDL 3.1 10/06/2021 1443   CHOLHDL 3 10/07/2020 1604   VLDL 54.6 (H) 10/07/2020 1604   LDLCALC 93 10/06/2021 1443   LDLDIRECT 90.0 10/07/2020 1604      Wt Readings from Last 3 Encounters:  12/11/21 194 lb 2 oz (88.1 kg)  10/06/21 192 lb 8 oz (87.3 kg)  08/27/20 198 lb (89.8 kg)       PAD Screen 10/06/2021  Previous PAD dx? No  Previous surgical procedure? Yes  Pain with walking? No  Feet/toe relief with dangling? No  Painful, non-healing ulcers? No  Extremities discolored? No      ASSESSMENT AND PLAN:  1.  Chronic systolic heart failure: Echocardiogram showed mildly reduced LV systolic function.  Symptoms include exertional dyspnea with no significant chest pain.  Given his risk factors, we have to exclude coronary artery disease as a  culprit for his cardiomyopathy.  Thus, I recommend proceeding with a right and left cardiac catheterization possible PCI.  I discussed the procedure in details as well as risk and benefits.  He appears to be euvolemic clinically. Resting tachycardia improved with small dose Toprol but still not controlled.  I increase Toprol to 50 mg daily.  Continue losartan.  2. Mildly dilated ascending aorta: This measured 42 mm by noncontrast CT. this was relatively stable on recent echocardiogram at 44 mm.  Recommend annual surveillance with imaging likely with CTA.  3.  Mildly elevated coronary calcium score: We will evaluate with coronary angiography as outlined above.  4.   Hyperlipidemia: His recent lipid profile showed an LDL of 93 and  triglyceride of 127.  If cardiac catheterization showed significant coronary artery disease, we will start the patient on a statin.  5.  Tobacco use: He is working to quit smoking.  6.  Sinus tachycardia: Improved but still persistent.  I increase the dose of Toprol.  7.  Essential hypertension: Blood pressure is reasonably controlled on current medications.     Disposition:   Proceed with a right and left cardiac catheterization next week and follow-up in 1 month.  Signed,  [name], MD  12/11/2021 10:19 AM    [place]

## 2021-12-11 NOTE — Progress Notes (Signed)
Cardiology Office Note   Date:  12/11/2021   ID:  [name], [name] 06/05/1956, MRN [id number]  PCP:  Biagio Borg, MD  Cardiologist:   [name], MD   Chief Complaint  Patient presents with   Other    F/u echo no complaints.  Meds reviewed verbally with pt.      History of Present Illness: Randy Walker is a 66 y.o. male who is here today for follow-up visit regarding  mildly dilated ascending aorta. 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 There is no family history of aortic aneurysms.  His father died at the age of 71 of myocardial infarction.  The patient smokes 10 cigarettes/day and has been doing so since he was a teenager.  He drinks 4 beers a day.  He is a retired Merchandiser, retail. He had CT calcium score in October 2022  which showed a calcium score of 29.  The ascending aorta was mildly dilated at 42 mm. During his initial evaluation, he reported occasional chest pain thought to be GI in nature with mild exertional dyspnea.  He was noted to have sinus tachycardia.  I requested routine labs which overall were unremarkable.  His hemoglobin was mildly elevated likely due to tobacco use. He had an echocardiogram done in December which showed an EF of 40 to 45% with grade 2 diastolic dysfunction, mild mitral regurgitation, mild to moderate aortic regurgitation and moderately dilated ascending aorta at 44 mm.  Based on this, I added Toprol 25 mg once daily.  He reports exertional dyspnea but no chest pain.  No significant lower extremity edema. He reports uncontrolled anxiety symptoms.  Past Medical History:  Diagnosis Date   Adenomatous polyps 11/17/2011   Alcohol use 11/17/2011   ALLERGIC RHINITIS 09/10/2007   ANXIETY 09/10/2007   BACK PAIN 12/31/2010   [place] DISEASE, LUMBAR 12/31/2010   GERD (gastroesophageal reflux disease) 11/23/2016    Hyperglycemia 11/23/2016   HYPERLIPIDEMIA 12/31/2010   Hypertension    Personal history of rectal adenomas 01/29/2008   PSORIASIS 12/31/2010   Psoriatic arthritis ([place])    PULMONARY EMBOLISM, HX OF 09/10/2007   RASH-NONVESICULAR 04/23/2009   Renal mass    SMOKER 12/31/2010    Past Surgical History:  Procedure Laterality Date   LYMPH NODE DISSECTION Bilateral 08/27/2020   Procedure: RETROPERITONEAL LYMPH NODE DISSECTION;  Surgeon: Alexis Frock, MD;  [facility];  Service: Urology;  Laterality: Bilateral;   ROBOT ASSISTED LAPAROSCOPIC NEPHRECTOMY Left 08/27/2020   Procedure: XI ROBOTIC ASSISTED LAPAROSCOPIC RADICAL NEPHRECTOMY;  Surgeon: Alexis Frock, MD;  [facility];  Service: Urology;  Laterality: Left;  3.5 HRS   ROTATOR CUFF REPAIR  Jan, 2010   [facility], Left   spleen repair     TOTAL HIP ARTHROPLASTY Left 09/09/2017   Procedure: LEFT TOTAL HIP ARTHROPLASTY ANTERIOR APPROACH;  Surgeon: Dorna Leitz, MD;  [facility];  Service: Orthopedics;  Laterality: Left;   WISDOM TOOTH EXTRACTION       Current Outpatient Medications  Medication Sig Dispense Refill   losartan (COZAAR) 50 MG tablet Take 1 tablet (50 mg total) by mouth daily. 90 tablet 1   metoprolol succinate (TOPROL XL) 25 MG 24 hr tablet Take 1 tablet (25 mg total) by mouth daily. 90 tablet 3   pantoprazole (PROTONIX) 40 MG tablet Take 1 tablet (40 mg total) by mouth daily. 90 tablet  1   HUMIRA PEN 40 MG/0.4ML PNKT every 14 (fourteen) days.     No current facility-administered medications for this visit.    Allergies:   Patient has no known allergies.    Social History:  The patient  reports that he has been smoking cigarettes. He has a 20.00 pack-year smoking history. He has never used smokeless tobacco. He reports current alcohol use. He reports that he does not use drugs.   Family History:  The patient's family history includes Heart attack in his father.    ROS:  Please see the history of present  illness.   Otherwise, review of systems are positive for none.   All other systems are reviewed and negative.    PHYSICAL EXAM: VS:  BP 140/78 (BP Location: Left Arm, Patient Position: Sitting, Cuff Size: Normal)    Pulse (!) 102    Ht 6\' 2"  (1.88 m)    Wt 194 lb 2 oz (88.1 kg)    SpO2 96%    BMI 24.92 kg/m  , BMI Body mass index is 24.92 kg/m. GEN: Well nourished, well developed, in no acute distress  HEENT: normal  Neck: no JVD, carotid bruits, or masses Cardiac: RRR; no murmurs, rubs, or gallops,no edema  Respiratory:  clear to auscultation bilaterally, normal work of breathing GI: soft, nontender, nondistended, + BS MS: no deformity or atrophy  Skin: warm and dry, no rash Neuro:  Strength and sensation are intact Psych: euthymic mood, full affect   EKG:  EKG is ordered today. The ekg ordered today demonstrates sinus tachycardia with left anterior fascicular block.   Recent Labs: 10/06/2021: ALT 39; BUN 7; Creatinine, Ser 0.90; Hemoglobin 17.9; Platelets 164; Potassium 4.1; Sodium 135; TSH 2.310    Lipid Panel    Component Value Date/Time   CHOL 170 10/06/2021 1443   TRIG 127 10/06/2021 1443   HDL 55 10/06/2021 1443   CHOLHDL 3.1 10/06/2021 1443   CHOLHDL 3 10/07/2020 1604   VLDL 54.6 (H) 10/07/2020 1604   LDLCALC 93 10/06/2021 1443   LDLDIRECT 90.0 10/07/2020 1604      Wt Readings from Last 3 Encounters:  12/11/21 194 lb 2 oz (88.1 kg)  10/06/21 192 lb 8 oz (87.3 kg)  08/27/20 198 lb (89.8 kg)       PAD Screen 10/06/2021  Previous PAD dx? No  Previous surgical procedure? Yes  Pain with walking? No  Feet/toe relief with dangling? No  Painful, non-healing ulcers? No  Extremities discolored? No      ASSESSMENT AND PLAN:  1.  Chronic systolic heart failure: Echocardiogram showed mildly reduced LV systolic function.  Symptoms include exertional dyspnea with no significant chest pain.  Given his risk factors, we have to exclude coronary artery disease as a  culprit for his cardiomyopathy.  Thus, I recommend proceeding with a right and left cardiac catheterization possible PCI.  I discussed the procedure in details as well as risk and benefits.  He appears to be euvolemic clinically. Resting tachycardia improved with small dose Toprol but still not controlled.  I increase Toprol to 50 mg daily.  Continue losartan.  2. Mildly dilated ascending aorta: This measured 42 mm by noncontrast CT. this was relatively stable on recent echocardiogram at 44 mm.  Recommend annual surveillance with imaging likely with CTA.  3.  Mildly elevated coronary calcium score: We will evaluate with coronary angiography as outlined above.  4.   Hyperlipidemia: His recent lipid profile showed an LDL of 93 and  triglyceride of 127.  If cardiac catheterization showed significant coronary artery disease, we will start the patient on a statin.  5.  Tobacco use: He is working to quit smoking.  6.  Sinus tachycardia: Improved but still persistent.  I increase the dose of Toprol.  7.  Essential hypertension: Blood pressure is reasonably controlled on current medications.     Disposition:   Proceed with a right and left cardiac catheterization next week and follow-up in 1 month.  Signed,  [name], MD  12/11/2021 10:19 AM    Randy Walker

## 2021-12-12 LAB — CBC WITH DIFFERENTIAL/PLATELET
Basophils Absolute: 0.1 10*3/uL (ref 0.0–0.2)
Basos: 2 %
EOS (ABSOLUTE): 0.1 10*3/uL (ref 0.0–0.4)
Eos: 2 %
Hematocrit: 50.1 % (ref 37.5–51.0)
Hemoglobin: 17.4 g/dL (ref 13.0–17.7)
Immature Grans (Abs): 0 10*3/uL (ref 0.0–0.1)
Immature Granulocytes: 1 %
Lymphocytes Absolute: 1.3 10*3/uL (ref 0.7–3.1)
Lymphs: 29 %
MCH: 34.9 pg — ABNORMAL HIGH (ref 26.6–33.0)
MCHC: 34.7 g/dL (ref 31.5–35.7)
MCV: 100 fL — ABNORMAL HIGH (ref 79–97)
Monocytes Absolute: 0.7 10*3/uL (ref 0.1–0.9)
Monocytes: 17 %
Neutrophils Absolute: 2.2 10*3/uL (ref 1.4–7.0)
Neutrophils: 49 %
Platelets: 166 10*3/uL (ref 150–450)
RBC: 4.99 x10E6/uL (ref 4.14–5.80)
RDW: 12.8 % (ref 11.6–15.4)
WBC: 4.3 10*3/uL (ref 3.4–10.8)

## 2021-12-12 LAB — BASIC METABOLIC PANEL
BUN/Creatinine Ratio: 12 (ref 10–24)
BUN: 14 mg/dL (ref 8–27)
CO2: 25 mmol/L (ref 20–29)
Calcium: 9.8 mg/dL (ref 8.6–10.2)
Chloride: 99 mmol/L (ref 96–106)
Creatinine, Ser: 1.19 mg/dL (ref 0.76–1.27)
Glucose: 99 mg/dL (ref 70–99)
Potassium: 5.2 mmol/L (ref 3.5–5.2)
Sodium: 140 mmol/L (ref 134–144)
eGFR: 68 mL/min/{1.73_m2} (ref >59)

## 2021-12-15 ENCOUNTER — Encounter
Admission: RE | Disposition: A | Discharge status: Home / Self Care | Payer: Self-pay | Source: Home / Self Care | Attending: Cardiovascular Disease

## 2021-12-15 ENCOUNTER — Other Ambulatory Visit: Payer: Self-pay

## 2021-12-15 ENCOUNTER — Encounter: Payer: Self-pay | Admitting: Cardiovascular Disease

## 2021-12-15 ENCOUNTER — Ambulatory Visit
Admission: RE | Admit: 2021-12-15 | Discharge: 2021-12-15 | Disposition: A | Discharge status: Home / Self Care | Payer: 59 | Attending: Cardiovascular Disease | Admitting: Cardiovascular Disease

## 2021-12-15 DIAGNOSIS — I5022 Chronic systolic (congestive) heart failure: Secondary | ICD-10-CM

## 2021-12-15 DIAGNOSIS — E785 Hyperlipidemia, unspecified: Secondary | ICD-10-CM | POA: Insufficient documentation

## 2021-12-15 DIAGNOSIS — R000 Tachycardia, unspecified: Secondary | ICD-10-CM | POA: Insufficient documentation

## 2021-12-15 DIAGNOSIS — Z905 Acquired absence of kidney: Secondary | ICD-10-CM | POA: Insufficient documentation

## 2021-12-15 DIAGNOSIS — F17210 Nicotine dependence, cigarettes, uncomplicated: Secondary | ICD-10-CM | POA: Insufficient documentation

## 2021-12-15 DIAGNOSIS — Z85528 Personal history of other malignant neoplasm of kidney: Secondary | ICD-10-CM | POA: Diagnosis not present

## 2021-12-15 DIAGNOSIS — I110 Hypertensive heart disease with heart failure: Secondary | ICD-10-CM | POA: Insufficient documentation

## 2021-12-15 DIAGNOSIS — I428 Other cardiomyopathies: Secondary | ICD-10-CM | POA: Diagnosis not present

## 2021-12-15 DIAGNOSIS — Z8249 Family history of ischemic heart disease and other diseases of the circulatory system: Secondary | ICD-10-CM | POA: Diagnosis not present

## 2021-12-15 DIAGNOSIS — I5020 Unspecified systolic (congestive) heart failure: Secondary | ICD-10-CM

## 2021-12-15 HISTORY — PX: RIGHT/LEFT HEART CATH AND CORONARY ANGIOGRAPHY: CATH118266

## 2021-12-15 SURGERY — RIGHT/LEFT HEART CATH AND CORONARY ANGIOGRAPHY
Anesthesia: Moderate Sedation

## 2021-12-15 MED ORDER — LIDOCAINE HCL 1 % IJ SOLN
INTRAMUSCULAR | Status: AC
  Filled 2021-12-15: qty 20

## 2021-12-15 MED ORDER — SODIUM CHLORIDE 0.9% FLUSH: 3.0000 mL | Freq: Two times a day (BID) | INTRAVENOUS | Status: DC

## 2021-12-15 MED ORDER — FENTANYL CITRATE (PF) 100 MCG/2ML IJ SOLN
INTRAMUSCULAR | Status: DC | PRN
  Administered 2021-12-15 (×2): 50 ug via INTRAVENOUS

## 2021-12-15 MED ORDER — FENTANYL CITRATE (PF) 100 MCG/2ML IJ SOLN
INTRAMUSCULAR | Status: AC
  Filled 2021-12-15: qty 2

## 2021-12-15 MED ORDER — SODIUM CHLORIDE 0.9 % IV SOLN: 250.0000 mL | INTRAVENOUS | Status: DC | PRN

## 2021-12-15 MED ORDER — MIDAZOLAM HCL 2 MG/2ML IJ SOLN
INTRAMUSCULAR | Status: AC
  Filled 2021-12-15: qty 2

## 2021-12-15 MED ORDER — HEPARIN SODIUM (PORCINE) 1000 UNIT/ML IJ SOLN
INTRAMUSCULAR | Status: DC | PRN
  Administered 2021-12-15: 4000 [IU] via INTRAVENOUS

## 2021-12-15 MED ORDER — MIDAZOLAM HCL 2 MG/2ML IJ SOLN
INTRAMUSCULAR | Status: DC | PRN
  Administered 2021-12-15 (×2): 1 mg via INTRAVENOUS

## 2021-12-15 MED ORDER — LIDOCAINE HCL (PF) 1 % IJ SOLN
INTRAMUSCULAR | Status: DC | PRN
  Administered 2021-12-15: 3 mL

## 2021-12-15 MED ORDER — ASPIRIN 81 MG PO CHEW: 81.0000 mg | CHEWABLE_TABLET | ORAL | Status: DC

## 2021-12-15 MED ORDER — IOHEXOL 300 MG/ML  SOLN
INTRAMUSCULAR | Status: DC | PRN
  Administered 2021-12-15: 15:00:00 48 mL

## 2021-12-15 MED ORDER — HEPARIN (PORCINE) IN NACL 1000-0.9 UT/500ML-% IV SOLN
INTRAVENOUS | Status: AC
  Filled 2021-12-15: qty 1000

## 2021-12-15 MED ORDER — ONDANSETRON HCL 4 MG/2ML IJ SOLN: 4.0000 mg | Freq: Four times a day (QID) | INTRAMUSCULAR | Status: DC | PRN

## 2021-12-15 MED ORDER — SODIUM CHLORIDE 0.9% FLUSH: 3.0000 mL | INTRAVENOUS | Status: DC | PRN

## 2021-12-15 MED ORDER — VERAPAMIL HCL 2.5 MG/ML IV SOLN
INTRAVENOUS | Status: AC
  Filled 2021-12-15: qty 2

## 2021-12-15 MED ORDER — VERAPAMIL HCL 2.5 MG/ML IV SOLN
INTRAVENOUS | Status: DC | PRN
  Administered 2021-12-15: 2.5 mg via INTRA_ARTERIAL

## 2021-12-15 MED ORDER — SODIUM CHLORIDE 0.9 % IV SOLN: INTRAVENOUS | Status: DC

## 2021-12-15 MED ORDER — HEPARIN (PORCINE) IN NACL 1000-0.9 UT/500ML-% IV SOLN
INTRAVENOUS | Status: DC | PRN
  Administered 2021-12-15: 1000 mL

## 2021-12-15 MED ORDER — HEPARIN SODIUM (PORCINE) 1000 UNIT/ML IJ SOLN
INTRAMUSCULAR | Status: AC
  Filled 2021-12-15: qty 10

## 2021-12-15 MED ORDER — ACETAMINOPHEN 325 MG PO TABS: 650.0000 mg | ORAL_TABLET | ORAL | Status: DC | PRN

## 2021-12-15 SURGICAL SUPPLY — 14 items
CATH BALLN WEDGE 5F 110CM (CATHETERS) ×2 IMPLANT
CATH INFINITI 5FR ANG PIGTAIL (CATHETERS) ×2 IMPLANT
CATH INFINITI 5FR JK (CATHETERS) ×2 IMPLANT
DEVICE RAD TR BAND REGULAR (VASCULAR PRODUCTS) ×2 IMPLANT
DRAPE BRACHIAL (DRAPES) ×4 IMPLANT
GLIDESHEATH SLEND SS 6F .021 (SHEATH) ×2 IMPLANT
GUIDEWIRE .025 260CM (WIRE) ×2 IMPLANT
GUIDEWIRE INQWIRE 1.5J.035X260 (WIRE) IMPLANT
INQWIRE 1.5J .035X260CM (WIRE) ×3
PACK CARDIAC CATH (CUSTOM PROCEDURE TRAY) ×3 IMPLANT
PROTECTION STATION PRESSURIZED (MISCELLANEOUS) ×3
SET ATX SIMPLICITY (MISCELLANEOUS) ×2 IMPLANT
SHEATH GLIDE SLENDER 4/5FR (SHEATH) ×2 IMPLANT
STATION PROTECTION PRESSURIZED (MISCELLANEOUS) IMPLANT

## 2021-12-15 NOTE — Interval H&P Note (Signed)
History and Physical Interval Note:  12/15/2021 2:07 PM  Randy Walker  has presented today for surgery, with the diagnosis of RT LT Heart Cath   HF with reduced EF.  The various methods of treatment have been discussed with the patient and family. After consideration of risks, benefits and other options for treatment, the patient has consented to  Procedure(s): RIGHT/LEFT HEART CATH AND CORONARY ANGIOGRAPHY (N/A) as a surgical intervention.  The patient's history has been reviewed, patient examined, no change in status, stable for surgery.  I have reviewed the patient's chart and labs.  Questions were answered to the patient's satisfaction.     [name]

## 2021-12-16 ENCOUNTER — Encounter: Payer: Self-pay | Admitting: Cardiovascular Disease

## 2022-01-13 NOTE — Progress Notes (Addendum)
Office Visit    Patient Name: Randy Walker Date of Encounter: 01/13/2022  Primary Care Provider:  Biagio Borg, MD Primary Cardiologist:  None  Chief Complaint    1 month follow-up for [facility]  History of Present Illness    Randy Walker is a 66 year old male following up today for Randy Walker.He has a history of HTN, HLD, HFrEF,NICM,left renal cancer s/p nephrectomy,former smoker,and mild aortic aneurysm. He was referred to [facility] following CTA on 10/22 for monitoring of aortic aneurysm and calcium score of 29.  During initial visit he was noted to have sinus tachycardia and reported some mild exertional CP believed to be GI in nature. He was prescribed Toprol XL. Dr. Fletcher Anon then ordered an Echo 12/22 with EF of 26-71%,IWPYK 2 diastolic dysfunction,with moderately dilated ascending aorta at 44 mm.On 12/11/21 he was seen in follow up and reported exertional dyspnea without CP. Cardiac Cath was ordered at that time to rule out CAD.Results from cath showed normal coronaries,right heart pressures,and mild nonischemic cardiomyopathy.    He presents today for cardiac cath follow up. [name] reports that he is doing well, following his procedure. Cath was accessed thru right [facility] with no hematoma, bruit, or thrill present. He denies CP,but does state that he has occasional bouts of dyspnea at rest. last for a few moments and resolves after rest. His heart rate remains elevated today however he states that it is in the 70's at home. He appear euvolemic on exam today. He denies fatigue, palpitations, diaphoresis, weakness, presyncope, syncope, orthopnea, and PND.   Past Medical History    Past Medical History:  Diagnosis Date   Adenomatous polyps 11/17/2011   Alcohol use 11/17/2011   ALLERGIC RHINITIS 09/10/2007   ANXIETY 09/10/2007   BACK PAIN 12/31/2010   [place] DISEASE, LUMBAR 12/31/2010   GERD (gastroesophageal reflux disease) 11/23/2016   Hyperglycemia 11/23/2016   HYPERLIPIDEMIA 12/31/2010    Hypertension    Personal history of rectal adenomas 01/29/2008   PSORIASIS 12/31/2010   Psoriatic arthritis ([place])    PULMONARY EMBOLISM, HX OF 09/10/2007   RASH-NONVESICULAR 04/23/2009   Renal mass    SMOKER 12/31/2010   Past Surgical History:  Procedure Laterality Date   LYMPH NODE DISSECTION Bilateral 08/27/2020   Procedure: RETROPERITONEAL LYMPH NODE DISSECTION;  Surgeon: Alexis Frock, MD;  [facility];  Service: Urology;  Laterality: Bilateral;   RIGHT/LEFT HEART CATH AND CORONARY ANGIOGRAPHY N/A 12/15/2021   Procedure: RIGHT/LEFT HEART CATH AND CORONARY ANGIOGRAPHY;  Surgeon: Wellington Hampshire, MD;  Location: [facility];  Service: Cardiovascular;  Laterality: N/A;   ROBOT ASSISTED LAPAROSCOPIC NEPHRECTOMY Left 08/27/2020   Procedure: XI ROBOTIC ASSISTED LAPAROSCOPIC RADICAL NEPHRECTOMY;  Surgeon: Alexis Frock, MD;  [facility];  Service: Urology;  Laterality: Left;  3.5 HRS   ROTATOR CUFF REPAIR  Jan, 2010   [facility], Left   spleen repair     TOTAL HIP ARTHROPLASTY Left 09/09/2017   Procedure: LEFT TOTAL HIP ARTHROPLASTY ANTERIOR APPROACH;  Surgeon: Dorna Leitz, MD;  [facility];  Service: Orthopedics;  Laterality: Left;   WISDOM TOOTH EXTRACTION      Allergies  No Known Allergies  Home Medications    Current Outpatient Medications  Medication Sig Dispense Refill   HUMIRA PEN 40 MG/0.4ML PNKT Inject 40 mg into the skin every 14 (fourteen) days. Fridays.     losartan (COZAAR) 50 MG tablet Take 1 tablet (50 mg total) by mouth daily. 90 tablet  1   metoprolol succinate (TOPROL XL) 50 MG 24 hr tablet Take 1 tablet (50 mg total) by mouth daily. 90 tablet 1   pantoprazole (PROTONIX) 40 MG tablet Take 1 tablet (40 mg total) by mouth daily. 90 tablet 1   No current facility-administered medications for this visit.     Review of Systems  Review of Systems  Cardiovascular:  Negative for chest pain, palpitations, orthopnea, claudication, leg swelling and PND.   Gastrointestinal:  Negative for heartburn.  Neurological:  Negative for dizziness.  All other systems reviewed and are negative.   Physical Exam    VS:  Vitals with BMI 01/14/2022 12/15/2021 12/15/2021  Height 6\' 2"  - -  Weight 195 lbs - -  BMI 03.49 - -  Systolic 179 - 150  Diastolic 98 - 94  Pulse 569 77 76       GEN: Well nourished, well developed, in no acute distress. HEENT: normal. Neck: Supple, no JVD, carotid bruits, or masses. Cardiac: RRR, no murmurs, rubs, or gallops. No clubbing, cyanosis, edema.  Radials/DP/PT 2+ and equal bilaterally.  Respiratory:  Respirations regular and unlabored, clear to auscultation bilaterally. GI: Soft, nontender, nondistended, BS + x 4. MS: no deformity or atrophy. Skin: warm and dry, no rash. Neuro:  Strength and sensation are intact. Psych: Normal affect.  Accessory Clinical Findings    ECG personally reviewed by me today -EKG sinus tachycardia with incomplete right bundle branch block left anterior fascicular block no acute changes.   Lab Results  Component Value Date   WBC 4.3 12/11/2021   HGB 17.4 12/11/2021   HCT 50.1 12/11/2021   MCV 100 (H) 12/11/2021   PLT 166 12/11/2021   Lab Results  Component Value Date   CREATININE 1.19 12/11/2021   BUN 14 12/11/2021   NA 140 12/11/2021   K 5.2 12/11/2021   CL 99 12/11/2021   CO2 25 12/11/2021   Lab Results  Component Value Date   ALT 39 10/06/2021   AST 34 10/06/2021   ALKPHOS 112 10/06/2021   BILITOT 0.5 10/06/2021   Lab Results  Component Value Date   CHOL 170 10/06/2021   HDL 55 10/06/2021   LDLCALC 93 10/06/2021   LDLDIRECT 90.0 10/07/2020   TRIG 127 10/06/2021   CHOLHDL 3.1 10/06/2021    Lab Results  Component Value Date   HGBA1C 5.6 10/07/2020    Assessment & Plan    1. HFrEF/ NICM  -R/LHC completed 12/15/2021 with no evidence of CAD, normal right heart and pulmonary pressures.  -EF: 40-45% based on Echo in 12/22 -Increase Toprol XL to 100 mg -Add  Jardiance 10 mg to optimize GDMT -BMET in 2 weeks  2. Dilated ascending aorta:  -Stable by echo on 12/22 at 44 mm. -Continue annual surveillance per CTA  3. HLD: -LDL 93 (11/22) -Continue low-sodium heart healthy diet and exercise 150 minutes per week  4.Hypertension:  -B/P today was 130/98,  -Continue losartan and Toprol XL -Low-sodium heart healthy diet and exercise 150 minutes/week  5. Sinus tachycardia: -HR today was 106 -Toprol XL increased to 100 mg  6.Alcohol Use:  -He reports that he has 8 mixed drinks a day. -We discussed the need for reducing alcohol consumption.  Disposition: Follow-up with MD or APP in 4 weeks   Mable Fill, Marissa Nestle, NP 01/13/2022, 6:33 PM

## 2022-01-14 ENCOUNTER — Other Ambulatory Visit: Payer: Self-pay

## 2022-01-14 ENCOUNTER — Ambulatory Visit (INDEPENDENT_AMBULATORY_CARE_PROVIDER_SITE_OTHER): Payer: 59 | Admitting: Nurse Practitioner

## 2022-01-14 ENCOUNTER — Encounter: Payer: Self-pay | Admitting: Medical

## 2022-01-14 VITALS — BP 130/98 | HR 106 | Ht 74.0 in | Wt 195.0 lb

## 2022-01-14 DIAGNOSIS — I5020 Unspecified systolic (congestive) heart failure: Secondary | ICD-10-CM | POA: Diagnosis not present

## 2022-01-14 DIAGNOSIS — E785 Hyperlipidemia, unspecified: Secondary | ICD-10-CM | POA: Diagnosis not present

## 2022-01-14 DIAGNOSIS — I5022 Chronic systolic (congestive) heart failure: Secondary | ICD-10-CM

## 2022-01-14 DIAGNOSIS — I7121 Aneurysm of the ascending aorta, without rupture: Secondary | ICD-10-CM

## 2022-01-14 DIAGNOSIS — Z789 Other specified health status: Secondary | ICD-10-CM

## 2022-01-14 DIAGNOSIS — F1090 Alcohol use, unspecified, uncomplicated: Secondary | ICD-10-CM

## 2022-01-14 MED ORDER — METOPROLOL SUCCINATE ER 100 MG PO TB24: 100.0000 mg | ORAL_TABLET | Freq: Every day | ORAL | 3 refills | Status: DC

## 2022-01-14 MED ORDER — PANTOPRAZOLE SODIUM 40 MG PO TBEC: 40.0000 mg | DELAYED_RELEASE_TABLET | Freq: Every day | ORAL | 3 refills | Status: DC

## 2022-01-14 MED ORDER — LOSARTAN POTASSIUM 50 MG PO TABS: 50.0000 mg | ORAL_TABLET | Freq: Every day | ORAL | 3 refills | Status: DC

## 2022-01-14 MED ORDER — EMPAGLIFLOZIN 10 MG PO TABS: 10.0000 mg | ORAL_TABLET | Freq: Every day | ORAL | 11 refills | Status: DC

## 2022-01-14 NOTE — Patient Instructions (Signed)
Medication Instructions:  Your physician has recommended you make the following change in your medication:   INCREASE Toprol XL (Metoprolol succinate) 100 mg once daily START Jardiance 10 mg once daily   *If you need a refill on your cardiac medications before your next appointment, please call your pharmacy*   Lab Work: [place] in 2 weeks here in our office.   If you have labs (blood work) drawn today and your tests are completely normal, you will receive your results only by: [place] (if you have MyChart) OR A paper copy in the mail If you have any lab test that is abnormal or we need to change your treatment, we will call you to review the results.   Testing/Procedures: None   Follow-Up: At [facility], you and your health needs are our priority.  As part of our continuing mission to provide you with exceptional heart care, we have created designated Provider Care Teams.  These Care Teams include your primary Cardiologist (physician) and Advanced Practice Providers (APPs -  Physician Assistants and Nurse Practitioners) who all work together to provide you with the care you need, when you need it.   Your next appointment:   1 month(s)  The format for your next appointment:   In Person  Provider:   [name], MD or Cadence Kathlen Mody, [place]

## 2022-01-28 ENCOUNTER — Other Ambulatory Visit: Payer: Self-pay

## 2022-01-28 ENCOUNTER — Other Ambulatory Visit (INDEPENDENT_AMBULATORY_CARE_PROVIDER_SITE_OTHER): Payer: 59

## 2022-01-28 DIAGNOSIS — I5020 Unspecified systolic (congestive) heart failure: Secondary | ICD-10-CM

## 2022-01-28 DIAGNOSIS — I5022 Chronic systolic (congestive) heart failure: Secondary | ICD-10-CM

## 2022-01-29 LAB — BASIC METABOLIC PANEL
BUN/Creatinine Ratio: 10 (ref 10–24)
BUN: 12 mg/dL (ref 8–27)
CO2: 19 mmol/L — ABNORMAL LOW (ref 20–29)
Calcium: 9.1 mg/dL (ref 8.6–10.2)
Chloride: 102 mmol/L (ref 96–106)
Creatinine, Ser: 1.21 mg/dL (ref 0.76–1.27)
Glucose: 104 mg/dL — ABNORMAL HIGH (ref 70–99)
Potassium: 4.4 mmol/L (ref 3.5–5.2)
Sodium: 137 mmol/L (ref 134–144)
eGFR: 67 mL/min/{1.73_m2} (ref >59)

## 2022-02-16 ENCOUNTER — Ambulatory Visit: Payer: 59 | Admitting: Medical

## 2022-02-24 NOTE — Progress Notes (Signed)
?Cardiology Office Note:   ? ?Date:  02/24/2022  ? ?ID:  Randy Walker, DOB Apr 17, 1956, MRN [id number] ? ?PCP:  Biagio Borg, MD  ?[facility] Cardiologist:  Fletcher Anon ?[place] Electrophysiologist:  None  ? ?Referring MD: Biagio Borg, MD  ? ?Chief Complaint: 1 month follow-up ? ?History of Present Illness:   ? ?Randy Walker is a 66 y.o. male with a hx of HTN, HLD, HFrEF, NICM, left renal cancer s/p nephrectomy, former smoker, and mild aortic aneurysm who presents for CHF follow-up.  ? ?He was referred to Dr. Fletcher Anon following CTA on 08/2021 for monitoring of aortic aneurysm. It also showed a calcium score or 29.  ? ?On the initial visit he was noted to have sinus tachycardia and reported mild dyspnea on exertion. He was started on toprol. Echo was ordered which showed LVEF 40-45%, G2DD, moderately dilated ascending aorta at 106m. He was set up for cardiac cath which showed normal coronaries.  ? ?Last seen 01/14/22 and was doing well. Toprol was increased and Jardiance was added.  ? ?Today, the patient reports he has been doing well. He has rare chest pain episodes. Breathing is sometimes short on severe exertion, but overall good. BP is mildly elevated. He is down to 3 cigarettes a day. He drinks 5 drinks daily. Not wanting to cut down, but will try. He is very active and has an active lifestyle.  ? ?Past Medical History:  ?Diagnosis Date  ? Adenomatous polyps 11/17/2011  ? Alcohol use 11/17/2011  ? ALLERGIC RHINITIS 09/10/2007  ? ANXIETY 09/10/2007  ? BACK PAIN 12/31/2010  ? [place] 12[place]2010  ? GERD (gastroesophageal reflux disease) 11/23/2016  ? Hyperglycemia 11/23/2016  ? HYPERLIPIDEMIA 12/31/2010  ? Hypertension   ? Personal history of rectal adenomas 01/29/2008  ? PSORIASIS 12/31/2010  ? [place]iatic arthritis ([place]   ? PULMONARY EMBOLISM, HX OF 09/10/2007  ? RASH-NONVESICULAR 04/23/2009  ? Renal mass   ? SMOKER 12/31/2010  ? ? ?Past Surgical History:  ?Procedure Laterality Date  ? LYMPH NODE DISSECTION  Bilateral 08/27/2020  ? Procedure: RETROPERITONEAL LYMPH NODE DISSECTION;  Surgeon: MAlexis Frock MD;  [facility];  Service: Urology;  Laterality: Bilateral;  ? RIGHT/LEFT HEART CATH AND CORONARY ANGIOGRAPHY N/A 12/15/2021  ? Procedure: RIGHT/LEFT HEART CATH AND CORONARY ANGIOGRAPHY;  Surgeon: AWellington Hampshire MD;  [facility];  Service: Cardiovascular;  Laterality: N/A;  ? ROBOT ASSISTED LAPAROSCOPIC NEPHRECTOMY Left 08/27/2020  ? Procedure: XI ROBOTIC ASSISTED LAPAROSCOPIC RADICAL NEPHRECTOMY;  Surgeon: MAlexis Frock MD;  [facility];  Service: Urology;  Laterality: Left;  3.5 HRS  ? ROTATOR CUFF REPAIR  Jan, 2010  ? [facility], Left  ? spleen repair    ? TOTAL HIP ARTHROPLASTY Left 09/09/2017  ? Procedure: LEFT TOTAL HIP ARTHROPLASTY ANTERIOR APPROACH;  Surgeon: GDorna Leitz MD;  [facility];  Service: Orthopedics;  Laterality: Left;  ? WISDOM TOOTH EXTRACTION    ? ? ?Current Medications: ?No outpatient medications have been marked as taking for the 02/25/22 encounter (Appointment) with FKathlen Mody Samanthajo Payano H, PA-C.  ?  ? ?Allergies:   Patient has no known allergies.  ? ?Social History  ? ?Socioeconomic History  ? Marital status: Married  ?  Spouse name: Not on file  ? Number of children: Not on file  ? Years of education: Not on file  ? Highest education level: Not on file  ?Occupational History  ? Occupation: rRegulatory affairs officer ?Tobacco Use  ?  Smoking status: Every Day  ?  Packs/day: 0.25  ?  Years: 40.00  ?  Pack years: 10.00  ?  Types: Cigarettes  ? Smokeless tobacco: Never  ? Tobacco comments:  ?  Restarted in March 2021  ?Vaping Use  ? Vaping Use: Never used  ?Substance and Sexual Activity  ? Alcohol use: Yes  ?  Comment: 3 [place] daily  ? Drug use: No  ? Sexual activity: Not on file  ?Other Topics Concern  ? Not on file  ?Social History Narrative  ? Not on file  ? ?Social Determinants of Health  ? ?Financial Resource Strain: Not on file  ?Food Insecurity: Not on file   ?Transportation Needs: Not on file  ?Physical Activity: Not on file  ?Stress: Not on file  ?Social Connections: Not on file  ?  ? ?Family History: ?The patient's family history includes Heart attack in his father. There is no history of Colon cancer, Esophageal cancer, Rectal cancer, or Stomach cancer. ? ?ROS:   ?Please see the history of present illness.    ? All other systems reviewed and are negative. ? ?EKGs/Labs/Other Studies Reviewed:   ? ?The following studies were reviewed today: ? ?R/L heart cath 12/15/21 ?  ?  There is mild left ventricular systolic dysfunction. ?  LV end diastolic pressure is normal. ?  The left ventricular ejection fraction is 45-50% by visual estimate. ?  ?1.  Normal coronary arteries. ?2.  Mildly reduced LV systolic function with an EF of 45%. ?3.  Right heart catheterization showed normal filling pressures, normal pulmonary pressure and normal cardiac output. ?  ?Recommendations: ?The patient has mild nonischemic cardiomyopathy.  Recommend continuing medical therapy.  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  Maximize the dose of Toprol as tolerated. ?  ? ?Echo 11/12/21 ?1. Left ventricular ejection fraction, by estimation, is 40 to 45%. The  ?left ventricle has mildly decreased function. The left ventricle  ?demonstrates global hypokinesis. The left ventricular internal cavity size  ?was mildly dilated. Left ventricular  ?diastolic parameters are consistent with Grade II diastolic dysfunction  ?(pseudonormalization).  ? 2. Right ventricular systolic function is normal. The right ventricular  ?size is normal.  ? 3. The mitral valve is normal in structure. Mild mitral valve  ?regurgitation. No evidence of mitral stenosis.  ? 4. The aortic valve is tricuspid. Aortic valve regurgitation is mild to  ?moderate. Aortic valve sclerosis is present, with no evidence of aortic  ?valve stenosis.  ? 5. There is mild dilatation of the aortic root, measuring 43 mm. There is  ?mild dilatation  of the ascending aorta, measuring 44 mm.  ? 6. The inferior vena cava is normal in size with greater than 50%  ?respiratory variability, suggesting right atrial pressure of 3 mmHg.  ? ?EKG:  EKG is not ordered today.   ? ?Recent Labs: ?10/06/2021: ALT 39; TSH 2.310 ?12/11/2021: Hemoglobin 17.4; Platelets 166 ?01/28/2022: BUN 12; Creatinine, Ser 1.21; Potassium 4.4; Sodium 137  ?Recent Lipid Panel ?   ?Component Value Date/Time  ? CHOL 170 10/06/2021 1443  ? TRIG 127 10/06/2021 1443  ? HDL 55 10/06/2021 1443  ? CHOLHDL 3.1 10/06/2021 1443  ? CHOLHDL 3 10/07/2020 1604  ? VLDL 54.6 (H) 10/07/2020 1604  ? [place] 93 10/06/2021 1443  ? LDLDIRECT 90.0 10/07/2020 1604  ? ? ?Physical Exam:   ? ?VS:  There were no vitals taken for this visit.   ? ?Wt Readings from Last 3 Encounters:  ?01/14/22 195  lb (88.5 kg)  ?12/15/21 181 lb (82.1 kg)  ?12/11/21 194 lb 2 oz (88.1 kg)  ?  ? ?GEN:  Well nourished, well developed in no acute distress ?HEENT: Normal ?NECK: No JVD; No carotid bruits ?LYMPHATICS: No lymphadenopathy ?CARDIAC: RRR, no murmurs, rubs, gallops ?RESPIRATORY:  Clear to auscultation without rales, wheezing or rhonchi  ?ABDOMEN: Soft, non-tender, non-distended ?MUSCULOSKELETAL:  No edema; No deformity  ?SKIN: Warm and dry ?NEUROLOGIC:  Alert and oriented x 3 ?PSYCHIATRIC:  Normal affect  ? ?ASSESSMENT:   ? ?No diagnosis found. ?PLAN:   ? ?In order of problems listed above: ? ?HFrEF ?NICM ?Echo 11/12/21 showed LVEF 40-45%. Cath showed normal coronaries. He is euvolemic on exam today. Continue Toprol and Jardiance. Stop Losartan and start Entresto 24-'26mg'$  BID. BMET in 1-2 weeks. Continue GDMT at follow-up.  ? ?Dilated ascending aorta ?Stable on echo 10/2021 at 82m. Continue with annual surveillance.  ? ?HLD ?LDL 93 09/2021. Lifestyle changes encouraged. ? ?HTN ?BP mildly elevated. Continue Toprol and Jardiance. Add on Entresto as above.  ? ?Sinus tachycardia ?Resolved with increase in ToprolXL to '100mg'$  daily. Heart rate  today 90bpm. ? ?Alcohol use ?He is drinking 5 alcoholic drinks daily. Encouraged to cut back, which he will try.  ? ?Tobacco use ?He down to smoking 2-3 cigarettes daily. Complete cessation encouraged.  ? ?Dispositio

## 2022-02-25 ENCOUNTER — Encounter: Payer: Self-pay | Admitting: Medical

## 2022-02-25 ENCOUNTER — Telehealth: Payer: Self-pay | Admitting: *Deleted

## 2022-02-25 ENCOUNTER — Ambulatory Visit (INDEPENDENT_AMBULATORY_CARE_PROVIDER_SITE_OTHER): Payer: 59 | Admitting: Medical

## 2022-02-25 VITALS — BP 140/90 | HR 90 | Ht 74.0 in | Wt 194.0 lb

## 2022-02-25 DIAGNOSIS — I5022 Chronic systolic (congestive) heart failure: Secondary | ICD-10-CM | POA: Diagnosis not present

## 2022-02-25 MED ORDER — ENTRESTO 24-26 MG PO TABS: 1.0000 | ORAL_TABLET | Freq: Two times a day (BID) | ORAL | 2 refills | Status: DC

## 2022-02-25 NOTE — Patient Instructions (Signed)
Medication Instructions:  ?Your physician has recommended you make the following change in your medication:  ? ?STOP Losartan ? ?START Entresto 24/26 mg twice a day. An Rx has sent to your pharmacy. ? ? ?*If you need a refill on your cardiac medications before your next appointment, please call your pharmacy* ? ? ?Lab Work: ?Your physician recommends that you return for lab work (Bmp) in: 1-2 weeks ? ?If you have labs (blood work) drawn today and your tests are completely normal, you will receive your results only by: ?MyChart Message (if you have MyChart) OR ?A paper copy in the mail ?If you have any lab test that is abnormal or we need to change your treatment, we will call you to review the results. ? ? ?Testing/Procedures: ?None ordered ? ? ?Follow-Up: ?At [facility], you and your health needs are our priority.  As part of our continuing mission to provide you with exceptional heart care, we have created designated Provider Care Teams.  These Care Teams include your primary Cardiologist (physician) and Advanced Practice Providers (APPs -  Physician Assistants and Nurse Practitioners) who all work together to provide you with the care you need, when you need it. ? ?We recommend signing up for the patient portal called "MyChart".  Sign up information is provided on this After Visit Summary.  MyChart is used to connect with patients for Virtual Visits (Telemedicine).  Patients are able to view lab/test results, encounter notes, upcoming appointments, etc.  Non-urgent messages can be sent to your provider as well.   ?To learn more about what you can do with MyChart, go to [email or web address].   ? ?Your next appointment:   ?4 week(s) ? ?The format for your next appointment:   ?In Person ? ?Provider:   ?You may see [name], MD or one of the following Advanced Practice Providers on your designated Care Team:   ?Murray Hodgkins, NP ?Christell Faith, PA-C ?Cadence Kathlen Mody, PA-C{ ? ? ?Other Instructions ?N/A ? ?

## 2022-02-25 NOTE — Telephone Encounter (Signed)
Per Cadence Kathlen Mody, PA ? ?-New York Heart Association Class II ?

## 2022-02-25 NOTE — Telephone Encounter (Signed)
PA required for Entresto 24-26 mg tablet. ?Please advise pt's Heart Association Class if there is one indicated for this pt. ?PA currently on hold for response.  ? ?Is the patient's heart failure classified as ONE of the following? 1) New York Heart Association Class II, 2) New York Heart Association Class III, 3) New [place] Heart Association Class IV ? -New [place] Heart Association Class II ? -New [place] Heart Association Class III ? -New [place] Heart Association Class IV ? -None of The Above ?

## 2022-02-25 NOTE — Telephone Encounter (Signed)
PA has been submitted via covermymeds. ?Awaiting approval. ? ? ?

## 2022-03-10 ENCOUNTER — Other Ambulatory Visit: Payer: 59

## 2022-03-19 ENCOUNTER — Other Ambulatory Visit: Payer: Self-pay

## 2022-03-19 DIAGNOSIS — I5022 Chronic systolic (congestive) heart failure: Secondary | ICD-10-CM

## 2022-03-20 LAB — BASIC METABOLIC PANEL WITH GFR
BUN/Creatinine Ratio: 11 (ref 10–24)
BUN: 12 mg/dL (ref 8–27)
CO2: 22 mmol/L (ref 20–29)
Calcium: 9.5 mg/dL (ref 8.6–10.2)
Chloride: 105 mmol/L (ref 96–106)
Creatinine, Ser: 1.13 mg/dL (ref 0.76–1.27)
Glucose: 97 mg/dL (ref 70–99)
Potassium: 5.5 mmol/L — ABNORMAL HIGH (ref 3.5–5.2)
Sodium: 142 mmol/L (ref 134–144)
eGFR: 73 mL/min/{1.73_m2}

## 2022-03-23 ENCOUNTER — Telehealth: Payer: Self-pay

## 2022-03-23 NOTE — Telephone Encounter (Signed)
Patient made aware of lab results. ?Patient denies taking potassium supplements but does endorse eating a lot of potassium rich foods. ?Adv him to cut back on food high in potassium. Gave him examples of these. ?He is scheduled to see Cadence on 03/25/22. Adv to keep that appt. ?

## 2022-03-23 NOTE — Telephone Encounter (Signed)
-----   Message from Fenton, PA-C sent at 03/23/2022  8:38 AM EDT ----- ?Can we repeat a potassium in a week. Also can we ask if he is on any potassium supplements. thanks ?

## 2022-03-23 NOTE — Telephone Encounter (Signed)
Furth, Cadence H, PA-C  You 5 minutes ago (1:19 PM)  ? ?Ok, thank you   ? ?

## 2022-03-25 ENCOUNTER — Encounter: Payer: Self-pay | Admitting: Medical

## 2022-03-25 ENCOUNTER — Ambulatory Visit (INDEPENDENT_AMBULATORY_CARE_PROVIDER_SITE_OTHER): Payer: 59 | Admitting: Medical

## 2022-03-25 VITALS — BP 120/88 | HR 116 | Ht 74.0 in | Wt 196.0 lb

## 2022-03-25 DIAGNOSIS — I5022 Chronic systolic (congestive) heart failure: Secondary | ICD-10-CM | POA: Diagnosis not present

## 2022-03-25 DIAGNOSIS — R000 Tachycardia, unspecified: Secondary | ICD-10-CM

## 2022-03-25 DIAGNOSIS — I7121 Aneurysm of the ascending aorta, without rupture: Secondary | ICD-10-CM | POA: Diagnosis not present

## 2022-03-25 DIAGNOSIS — E782 Mixed hyperlipidemia: Secondary | ICD-10-CM

## 2022-03-25 DIAGNOSIS — I10 Essential (primary) hypertension: Secondary | ICD-10-CM

## 2022-03-25 DIAGNOSIS — Z720 Tobacco use: Secondary | ICD-10-CM

## 2022-03-25 DIAGNOSIS — Z789 Other specified health status: Secondary | ICD-10-CM

## 2022-03-25 MED ORDER — SPIRONOLACTONE 25 MG PO TABS: 12.5000 mg | ORAL_TABLET | Freq: Every day | ORAL | 5 refills | Status: DC

## 2022-03-25 NOTE — Progress Notes (Signed)
?Cardiology Office Note:   ? ?Date:  03/25/2022  ? ?ID:  LIAHM GRIVAS, DOB 09/17/1956, MRN [id number] ? ?PCP:  Biagio Borg, MD  ?[facility] Cardiologist:  None  ?[place] Electrophysiologist:  None  ? ?Referring MD: Biagio Borg, MD  ? ?Chief Complaint: 4 week follow-up ? ?History of Present Illness:   ? ?Randy Walker is a 66 y.o. male with a hx of  HTN, HLD, HFrEF, NICM, left renal cancer s/p nephrectomy, former smoker, and mild aortic aneurysm who presents for CHF follow-up.  ?  ?He was referred to Dr. Fletcher Anon following CTA on 08/2021 for monitoring of aortic aneurysm. It also showed a calcium score or 29.  ?  ?On the initial visit he was noted to have sinus tachycardia and reported mild dyspnea on exertion. He was started on toprol. Echo was ordered which showed LVEF 40-45%, G2DD, moderately dilated ascending aorta at 51m. He was set up for cardiac cath which showed normal coronaries.  ?  ?Seen 01/14/22 and was doing well. Toprol was increased and Jardiance was added.  ? ?Last seen 02/25/22 and was cutting down smoking and drinking 5 drinks daily. [facility]  ? ?Today, the patient reports [facility] is [facility] Has some leg numbness, unsure why. Heart rate is high, this is chronic. Rhythm is regular on exam. He has no pain with walking. He is down to 3 cigarettes daily. He cut back on bourbon and is now drinking wine. He feels the same as prior visits. No chest pain or sob. NO LLE, orthopnea, pnd.   ? ? ?Past Medical History:  ?Diagnosis Date  ? Adenomatous polyps 11/17/2011  ? Alcohol use 11/17/2011  ? ALLERGIC RHINITIS 09/10/2007  ? ANXIETY 09/10/2007  ? BACK PAIN 12/31/2010  ? DSt. JohnDISEASE, LUMBAR 12/31/2010  ? GERD (gastroesophageal reflux disease) 11/23/2016  ? Hyperglycemia 11/23/2016  ? HYPERLIPIDEMIA 12/31/2010  ? Hypertension   ? Personal history of rectal adenomas 01/29/2008  ? PSORIASIS 12/31/2010  ? Psoriatic arthritis ([place]   ? PULMONARY EMBOLISM, HX OF 09/10/2007   ? RASH-NONVESICULAR 04/23/2009  ? Renal mass   ? SMOKER 12/31/2010  ? ? ?Past Surgical History:  ?Procedure Laterality Date  ? LYMPH NODE DISSECTION Bilateral 08/27/2020  ? Procedure: RETROPERITONEAL LYMPH NODE DISSECTION;  Surgeon: MAlexis Frock MD;  Location: [facility];  Service: Urology;  Laterality: Bilateral;  ? RIGHT/LEFT HEART CATH AND CORONARY ANGIOGRAPHY N/A 12/15/2021  ? Procedure: RIGHT/LEFT HEART CATH AND CORONARY ANGIOGRAPHY;  Surgeon: AWellington Hampshire MD;  [facility];  Service: Cardiovascular;  Laterality: N/A;  ? ROBOT ASSISTED LAPAROSCOPIC NEPHRECTOMY Left 08/27/2020  ? Procedure: XI ROBOTIC ASSISTED LAPAROSCOPIC RADICAL NEPHRECTOMY;  Surgeon: MAlexis Frock MD;  Location: [facility];  Service: Urology;  Laterality: Left;  3.5 HRS  ? ROTATOR CUFF REPAIR  Jan, 2010  ? [facility], Left  ? spleen repair    ? TOTAL HIP ARTHROPLASTY Left 09/09/2017  ? Procedure: LEFT TOTAL HIP ARTHROPLASTY ANTERIOR APPROACH;  Surgeon: GDorna Leitz MD;  Location: [facility];  Service: Orthopedics;  Laterality: Left;  ? WISDOM TOOTH EXTRACTION    ? ? ?Current Medications: ?Current Meds  ?Medication Sig  ? empagliflozin (JARDIANCE) 10 MG TABS tablet Take 1 tablet (10 mg total) by mouth daily before breakfast.  ? HUMIRA PEN 40 MG/0.4ML PNKT Inject 40 mg into the skin every 14 (fourteen) days. Fridays.  ? metoprolol succinate (TOPROL XL) 100 MG 24 hr  tablet Take 1 tablet (100 mg total) by mouth daily. Take with or immediately following a meal.  ? pantoprazole (PROTONIX) 40 MG tablet Take 1 tablet (40 mg total) by mouth daily.  ? sacubitril-valsartan (ENTRESTO) 24-26 MG Take 1 tablet by mouth 2 (two) times daily.  ? spironolactone (ALDACTONE) 25 MG tablet Take 0.5 tablets (12.5 mg total) by mouth daily.  ?  ? ?Allergies:   Patient has no known allergies.  ? ?Social History  ? ?Socioeconomic History  ? Marital status: Married  ?  Spouse name: Not on file  ? Number of children: Not on file  ? Years of education: Not on  file  ? Highest education level: Not on file  ?Occupational History  ? Occupation: Regulatory affairs officer  ?Tobacco Use  ? Smoking status: Every Day  ?  Packs/day: 0.25  ?  Years: 40.00  ?  Pack years: 10.00  ?  Types: Cigarettes  ? Smokeless tobacco: Never  ? Tobacco comments:  ?  Restarted in March 2021  ?Vaping Use  ? Vaping Use: Never used  ?Substance and Sexual Activity  ? Alcohol use: Yes  ?  Comment: 3 Bourbon daily  ? Drug use: No  ? Sexual activity: Not on file  ?Other Topics Concern  ? Not on file  ?Social History Narrative  ? Not on file  ? ?Social Determinants of Health  ? ?Financial Resource Strain: Not on file  ?Food Insecurity: Not on file  ?Transportation Needs: Not on file  ?Physical Activity: Not on file  ?Stress: Not on file  ?Social Connections: Not on file  ?  ? ?Family History: ?The patient's family history includes Heart attack in his father. There is no history of Colon cancer, Esophageal cancer, Rectal cancer, or Stomach cancer. ? ?ROS:   ?Please see the history of present illness.    ? All other systems reviewed and are negative. ? ?EKGs/Labs/Other Studies Reviewed:   ? ?The following studies were reviewed today: ? ?  ?R/L heart cath 12/15/21 ?  ?  There is mild left ventricular systolic dysfunction. ?  LV end diastolic pressure is normal. ?  The left ventricular ejection fraction is 45-50% by visual estimate. ?  ?1.  Normal coronary arteries. ?2.  Mildly reduced LV systolic function with an EF of 45%. ?3.  Right heart catheterization showed normal filling pressures, normal pulmonary pressure and normal cardiac output. ?  ?Recommendations: ?The patient has mild nonischemic cardiomyopathy.  Recommend continuing medical therapy.  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  Maximize the dose of Toprol as tolerated. ?  ?  ?Echo 11/12/21 ?1. Left ventricular ejection fraction, by estimation, is 40 to 45%. The  ?left ventricle has mildly decreased function. The left ventricle  ?demonstrates global  hypokinesis. The left ventricular internal cavity size  ?was mildly dilated. Left ventricular  ?diastolic parameters are consistent with Grade II diastolic dysfunction  ?(pseudonormalization).  ? 2. Right ventricular systolic function is normal. The right ventricular  ?size is normal.  ? 3. The mitral valve is normal in structure. Mild mitral valve  ?regurgitation. No evidence of mitral stenosis.  ? 4. The aortic valve is tricuspid. Aortic valve regurgitation is mild to  ?moderate. Aortic valve sclerosis is present, with no evidence of aortic  ?valve stenosis.  ? 5. There is mild dilatation of the aortic root, measuring 43 mm. There is  ?mild dilatation of the ascending aorta, measuring 44 mm.  ? 6. The inferior vena cava is normal in size  with greater than 50%  ?respiratory variability, suggesting right atrial pressure of 3 mmHg.  ? ?EKG:  EKG is  ordered today.  The ekg ordered today demonstrates ST, 116bpm, LAD, LAFB, nonspecific ST changes ? ?Recent Labs: ?10/06/2021: ALT 39; TSH 2.310 ?12/11/2021: Hemoglobin 17.4; Platelets 166 ?03/19/2022: BUN 12; Creatinine, Ser 1.13; Potassium 5.5; Sodium 142  ?Recent Lipid Panel ?   ?Component Value Date/Time  ? CHOL 170 10/06/2021 1443  ? TRIG 127 10/06/2021 1443  ? HDL 55 10/06/2021 1443  ? CHOLHDL 3.1 10/06/2021 1443  ? CHOLHDL 3 10/07/2020 1604  ? VLDL 54.6 (H) 10/07/2020 1604  ? [place] 93 10/06/2021 1443  ? LDLDIRECT 90.0 10/07/2020 1604  ? ? ?Physical Exam:   ? ?VS:  BP 120/88 (BP Location: Left Arm, Patient Position: Sitting, Cuff Size: Normal)   Pulse (!) 116   Ht '6\' 2"'$  (1.88 m)   Wt 196 lb (88.9 kg)   SpO2 97%   BMI 25.16 kg/m?    ? ?Wt Readings from Last 3 Encounters:  ?03/25/22 196 lb (88.9 kg)  ?02/25/22 194 lb (88 kg)  ?01/14/22 195 lb (88.5 kg)  ?  ? ?GEN:  Well nourished, well developed in no acute distress ?HEENT: Normal ?NECK: No JVD; No carotid bruits ?LYMPHATICS: No lymphadenopathy ?CARDIAC: Irreg Irreg, no murmurs, rubs, gallops ?RESPIRATORY:  Clear  to auscultation without rales, wheezing or rhonchi  ?ABDOMEN: Soft, non-tender, non-distended ?MUSCULOSKELETAL:  No edema; No deformity  ?SKIN: Warm and dry ?NEUROLOGIC:  Alert and oriented x 3 ?PSYCHIATRIC:

## 2022-03-25 NOTE — Patient Instructions (Addendum)
Medication Instructions:  ?Your physician has recommended you make the following change in your medication:  ? ?START Spironolactone 12.'5mg'$  (1/2 tablet) daily. An Rx has been sent to your pharmacy. ? ? ?*If you need a refill on your cardiac medications before your next appointment, please call your pharmacy* ? ? ?Lab Work: ?Your physician recommends that you return for lab work (Bmp) in: 1-2 weeks ? ?Take the lab slip given to you today with you to [place]. ? ?If you have labs (blood work) drawn today and your tests are completely normal, you will receive your results only by: ?MyChart Message (if you have MyChart) OR ?A paper copy in the mail ?If you have any lab test that is abnormal or we need to change your treatment, we will call you to review the results. ? ? ?Testing/Procedures: ?Your physician has requested that you have an echocardiogram. Echocardiography is a painless test that uses sound waves to create images of your heart. It provides your doctor with information about the size and shape of your heart and how well your heart?s chambers and valves are working. This procedure takes approximately one hour. There are no restrictions for this procedure. ?(To be scheduled in 2 months) ? ? ?Follow-Up: ?At [facility], you and your health needs are our priority.  As part of our continuing mission to provide you with exceptional heart care, we have created designated Provider Care Teams.  These Care Teams include your primary Cardiologist (physician) and Advanced Practice Providers (APPs -  Physician Assistants and Nurse Practitioners) who all work together to provide you with the care you need, when you need it. ? ?We recommend signing up for the patient portal called "MyChart".  Sign up information is provided on this After Visit Summary.  MyChart is used to connect with patients for Virtual Visits (Telemedicine).  Patients are able to view lab/test results, encounter notes, upcoming appointments, etc.   Non-urgent messages can be sent to your provider as well.   ?To learn more about what you can do with MyChart, go to [email or web address].   ? ?Your next appointment:   ?3 month(s) ? ?The format for your next appointment:   ?In Person ? ?Provider:   ?You may see [name], MD or one of the following Advanced Practice Providers on your designated Care Team:   ?Murray Hodgkins, NP ?Christell Faith, PA-C ?Cadence Kathlen Mody, PA-C ? ?Other Instructions ?N/A ? ?Important Information About Sugar ? ? ? ? ? ? ?

## 2022-04-29 ENCOUNTER — Other Ambulatory Visit: Payer: Self-pay | Admitting: Medical

## 2022-04-29 DIAGNOSIS — I5022 Chronic systolic (congestive) heart failure: Secondary | ICD-10-CM

## 2022-05-26 ENCOUNTER — Other Ambulatory Visit: Payer: 59

## 2022-05-27 ENCOUNTER — Ambulatory Visit (INDEPENDENT_AMBULATORY_CARE_PROVIDER_SITE_OTHER): Payer: 59

## 2022-05-27 DIAGNOSIS — I5022 Chronic systolic (congestive) heart failure: Secondary | ICD-10-CM

## 2022-05-27 LAB — ECHOCARDIOGRAM LIMITED
Area-P 1/2: 3.97 cm2
Calc EF: 47.9 %
S' Lateral: 3.7 cm
Single Plane A2C EF: 48.9 %
Single Plane A4C EF: 49.9 %

## 2022-05-27 LAB — BASIC METABOLIC PANEL
BUN/Creatinine Ratio: 11 (ref 10–24)
BUN: 12 mg/dL (ref 8–27)
CO2: 22 mmol/L (ref 20–29)
Calcium: 9.5 mg/dL (ref 8.6–10.2)
Chloride: 97 mmol/L (ref 96–106)
Creatinine, Ser: 1.12 mg/dL (ref 0.76–1.27)
Glucose: 127 mg/dL — ABNORMAL HIGH (ref 70–99)
Potassium: 4.7 mmol/L (ref 3.5–5.2)
Sodium: 133 mmol/L — ABNORMAL LOW (ref 134–144)
eGFR: 73 mL/min/{1.73_m2} (ref >59)

## 2022-05-31 ENCOUNTER — Telehealth: Payer: Self-pay | Admitting: Medical

## 2022-05-31 NOTE — Telephone Encounter (Signed)
Randy Ninfa Meeker, PA-C  05/31/2022  1:41 PM EDT     Echo showed unchanged pump function

## 2022-05-31 NOTE — Telephone Encounter (Signed)
Attempted to call the patient with results. No answer- his voice mail box is currently full.   Will try to reach the patient at a later time.

## 2022-06-16 ENCOUNTER — Other Ambulatory Visit: Payer: Self-pay | Admitting: Urology

## 2022-06-18 NOTE — Patient Instructions (Addendum)
SURGICAL WAITING ROOM VISITATION Patients having surgery or a procedure may have no more than 2 support people in the waiting area - these visitors may rotate.   Children under the age of 82 must have an adult with them who is not the patient. If the patient needs to stay at the hospital during part of their recovery, the visitor guidelines for inpatient rooms apply. Pre-op nurse will coordinate an appropriate time for 1 support person to accompany patient in pre-op.  This support person may not rotate.    Please refer to the [facility] website for the visitor guidelines for Inpatients (after your surgery is over and you are in a regular room).      Your procedure is scheduled on: 07-07-22   Report to [facility] Main Entrance    Report to admitting at 12:15 PM   Call this number if you have problems the morning of surgery [phone]   Do not eat food :After Midnight.   After Midnight you may have the following liquids until 11:30 AM DAY OF SURGERY  Water Non-Citrus Juices (without pulp, NO RED) Carbonated Beverages Black Coffee (NO MILK/CREAM OR CREAMERS, sugar ok)  Clear Tea (NO MILK/CREAM OR CREAMERS, sugar ok) regular and decaf                             Plain Jell-O (NO RED)                                           Fruit ices (not with fruit pulp, NO RED)                                     Popsicles (NO RED)                                                               Sports drinks like Gatorade (NO RED)                     If you have questions, please contact your surgeon's office.  FOLLOW ANY ADDITIONAL PRE OP INSTRUCTIONS YOU RECEIVED FROM YOUR SURGEON'S OFFICE!!!     Oral Hygiene is also important to reduce your risk of infection.                                    Remember - BRUSH YOUR TEETH THE MORNING OF SURGERY WITH YOUR REGULAR TOOTHPASTE   Do NOT smoke after Midnight   Take these medicines the morning of surgery with A SIP OF WATER:  Metoprolol,  Pantoprazole                            You may not have any metal on your body including  jewelry, and body piercing             Do not wear lotions, powders, cologne, or deodorant  Men may shave face and neck.   Do not bring valuables to the hospital. [place].   Contacts, dentures or bridgework may not be worn into surgery.   Bring small overnight bag day of surgery.   DO NOT [place]. PHARMACY WILL DISPENSE MEDICATIONS LISTED ON YOUR MEDICATION LIST TO YOU DURING YOUR ADMISSION [place]!   Special Instructions: Bring a copy of your healthcare power of attorney and living will documents the day of surgery if you haven't scanned them before.  Please read over the following fact sheets you were given: IF YOU HAVE QUESTIONS ABOUT YOUR PRE-OP INSTRUCTIONS PLEASE CALL [place] - Preparing for Surgery Before surgery, you can play an important role.  Because skin is not sterile, your skin needs to be as free of germs as possible.  You can reduce the number of germs on your skin by washing with CHG (chlorahexidine gluconate) soap before surgery.  CHG is an antiseptic cleaner which kills germs and bonds with the skin to continue killing germs even after washing. Please DO NOT use if you have an allergy to CHG or antibacterial soaps.  If your skin becomes reddened/irritated stop using the CHG and inform your nurse when you arrive at Short Stay. Do not shave (including legs and underarms) for at least 48 hours prior to the first CHG shower.  You may shave your face/neck.  Please follow these instructions carefully:  1.  Shower with CHG Soap the night before surgery and the  morning of surgery.  2.  If you choose to wash your hair, wash your hair first as usual with your normal  shampoo.  3.  After you shampoo, rinse your hair and body thoroughly to remove the shampoo.                              4.  Use CHG as you would any other liquid soap.  You can apply chg directly to the skin and wash.  Gently with a scrungie or clean washcloth.  5.  Apply the CHG Soap to your body ONLY FROM THE NECK DOWN.   Do   not use on face/ open                           Wound or open sores. Avoid contact with eyes, ears mouth and   genitals (private parts).                       Wash face,  Genitals (private parts) with your normal soap.             6.  Wash thoroughly, paying special attention to the area where your    surgery  will be performed.  7.  Thoroughly rinse your body with warm water from the neck down.  8.  DO NOT shower/wash with your normal soap after using and rinsing off the CHG Soap.                9.  Pat yourself dry with a clean towel.            10.  Wear clean pajamas.            11.  Place clean sheets on your bed the night of your first  shower and do not  sleep with pets. Day of Surgery : Do not apply any lotions/deodorants the morning of surgery.  Please wear clean clothes to the hospital/surgery center.  FAILURE TO FOLLOW THESE INSTRUCTIONS MAY RESULT IN THE CANCELLATION OF YOUR SURGERY  PATIENT SIGNATURE_________________________________  NURSE SIGNATURE__________________________________  ________________________________________________________________________

## 2022-06-22 NOTE — Progress Notes (Signed)
[facility] scheduler line. LVM for Selita B. To request preop orders needed for surgery.     06/22/22 1542  Preop Orders  Has preop orders? No  Name of staff/physician contacted for orders(Indicate phone or IB message) Noemi Chapel.

## 2022-06-23 ENCOUNTER — Other Ambulatory Visit (HOSPITAL_COMMUNITY): Payer: Self-pay

## 2022-06-25 NOTE — Progress Notes (Addendum)
COVID Vaccine Completed: Yes  Date of COVID positive in last 90 days:  No  PCP - Cathlean Cower, MD Cardiologist - [name], MD  Chest x-ray - N/A EKG - 03-25-22 Epic Stress Test - greater than 2 years ECHO - 05-27-22 Epic  Cardiac Cath - 12-15-21 Epic Pacemaker/ICD device last checked: Spinal Cord Stimulator: Cardiac CT - 09-14-21 Epic  Bowel Prep - N/A  Sleep Study - N/A CPAP -   Fasting Blood Sugar - N/A Checks Blood Sugar _____ times a day  Blood Thinner Instructions:  N/A Aspirin Instructions: Last Dose:  Activity level:   Can go up a flight of stairs and perform activities of daily living without stopping and without symptoms of chest pain or shortness of breath.  Anesthesia review:  CHF, HTN, Hx of PE, dilated ascending aorta  Patient denies shortness of breath, fever, cough and chest pain at PAT appointment  Patient verbalized understanding of instructions that were given to them at the PAT appointment. Patient was also instructed that they will need to review over the PAT instructions again at home before surgery.

## 2022-06-28 ENCOUNTER — Ambulatory Visit: Payer: 59 | Admitting: Medical

## 2022-06-28 ENCOUNTER — Encounter: Payer: Self-pay | Admitting: Medical

## 2022-06-28 NOTE — Progress Notes (Deleted)
Cardiology Office Note:    Date:  06/28/2022   ID:  Randy Walker, DOB 05-Mar-1956, MRN [id number]  PCP:  Randy Borg, Walker  [facility] Cardiologist:  None  [facility] Electrophysiologist:  None   Referring Walker: Randy Borg, Walker   Chief Complaint: 3 month follow-up  History of Present Illness:    Randy Walker is Randy 66 y.o. male with Randy hx of HTN, HLD, HFrEF, NICM, left renal cancer s/p nephrectomy, former smoker, and mild aortic aneurysm who presents for CHF follow-up.    He was referred to Randy Walker following CTA on 08/2021 for monitoring of aortic aneurysm. It also showed Randy calcium score or 29.    On the initial visit he was noted to have sinus tachycardia and reported mild dyspnea on exertion. He was started on toprol. Echo was ordered which showed LVEF 40-45%, G2DD, moderately dilated ascending aorta at 47m. He was set up for cardiac cath which showed normal coronaries.   Last seen 03/25/22 and was doing well overall with GDMT. Spironolactone was ordered. Follow-up limited echo showed LVEF 45-50%.  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   GERD (gastroesophageal reflux disease) 11/23/2016   Hyperglycemia 11/23/2016   HYPERLIPIDEMIA 12/31/2010   Hypertension    Personal history of rectal adenomas 01/29/2008   PSORIASIS 12/31/2010   Psoriatic arthritis ([place]    PULMONARY EMBOLISM, HX OF 09/10/2007   RASH-NONVESICULAR 04/23/2009   Renal mass    SMOKER 12/31/2010    Past Surgical History:  Procedure Laterality Date   LYMPH NODE DISSECTION Bilateral 08/27/2020   Procedure: RETROPERITONEAL LYMPH NODE DISSECTION;  Surgeon: Randy Frock Walker;  [facility]: [facility];  Service: Urology;  Laterality: Bilateral;   RIGHT/LEFT HEART CATH AND CORONARY ANGIOGRAPHY N/Randy 12/15/2021   Procedure: RIGHT/LEFT HEART CATH AND  CORONARY ANGIOGRAPHY;  Surgeon: AWellington Hampshire Walker;  [facility];  Service: Cardiovascular;  Laterality: N/Randy;   ROBOT ASSISTED LAPAROSCOPIC NEPHRECTOMY Left 08/27/2020   Procedure: XI ROBOTIC ASSISTED LAPAROSCOPIC RADICAL NEPHRECTOMY;  Surgeon: Randy Frock Walker;  Location: [facility];  Service: Urology;  Laterality: Left;  3.5 HRS   ROTATOR CUFF REPAIR  Jan, 2010   [facility], Left   spleen repair     TOTAL HIP ARTHROPLASTY Left 09/09/2017   Procedure: LEFT TOTAL HIP ARTHROPLASTY ANTERIOR APPROACH;  Surgeon: GDorna Leitz Walker;  Location: [facility];  Service: Orthopedics;  Laterality: Left;   WISDOM TOOTH EXTRACTION      Current Medications: No outpatient medications have been marked as taking for the 06/28/22 encounter (Appointment) with FKathlen Mody Randy Tonkinson H, PA-C.     Allergies:   Patient has no known allergies.   Social History   Socioeconomic History   Marital status: Married    Spouse name: Not on file   Number of children: Not on file   Years of education: Not on file   Highest education level: Not on file  Occupational History   Occupation: rRegulatory affairs officer Tobacco Use   Smoking status: Every Day    Packs/day: 0.25    Years: 40.00    Total pack years: 10.00    Types: Cigarettes   Smokeless tobacco: Never   Tobacco comments:    Restarted in March 2021  Vaping Use   Vaping Use: Never  used  Substance and Sexual Activity   Alcohol use: Yes    Comment: 3 Bourbon daily   Drug use: No   Sexual activity: Not on file  Other Topics Concern   Not on file  Social History Narrative   Not on file   Social Determinants of Health   Financial Resource Strain: Not on file  Food Insecurity: Not on file  Transportation Needs: Not on file  Physical Activity: Not on file  Stress: Not on file  Social Connections: Not on file     Family History: The patient's family history includes Heart attack in his father. There is no history of Colon cancer, Esophageal cancer,  Rectal cancer, or Stomach cancer.  ROS:   Please see the history of present illness.     All other systems reviewed and are negative.  EKGs/Labs/Other Studies Reviewed:    The following studies were reviewed today:  Limited echo 05/27/22  1. Left ventricular ejection fraction, by estimation, is 40 to 45%. The  left ventricle has mildly decreased function. The left ventricle  demonstrates global hypokinesis. The average left ventricular global  longitudinal strain is -15.8 %.   2. Right ventricular systolic function is normal. The right ventricular  size is normal.   3. The mitral valve is normal in structure. No evidence of mitral valve  regurgitation. No evidence of mitral stenosis.   4. The aortic valve is normal in structure. Aortic valve regurgitation is  mild. No aortic stenosis is present.   5. The inferior vena cava is normal in size with greater than 50%  respiratory variability, suggesting right atrial pressure of 3 mmHg.   Comparison(s): Heart cath reported LVEF of 45-50% in 11/2021.   R/L heart cath 12/15/21     There is mild left ventricular systolic dysfunction.   LV end diastolic pressure is normal.   The left ventricular ejection fraction is 45-50% by visual estimate.   1.  Normal coronary arteries. 2.  Mildly reduced LV systolic function with an EF of 45%. 3.  Right heart catheterization showed normal filling pressures, normal pulmonary pressure and normal cardiac output.   Recommendations: The patient has mild nonischemic cardiomyopathy.  Recommend continuing medical therapy.  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  Maximize the dose of Toprol as tolerated.     Echo 11/12/21 1. Left ventricular ejection fraction, by estimation, is 40 to 45%. The  left ventricle has mildly decreased function. The left ventricle  demonstrates global hypokinesis. The left ventricular internal cavity size  was mildly dilated. Left ventricular  diastolic parameters are  consistent with Grade II diastolic dysfunction  (pseudonormalization).   2. Right ventricular systolic function is normal. The right ventricular  size is normal.   3. The mitral valve is normal in structure. Mild mitral valve  regurgitation. No evidence of mitral stenosis.   4. The aortic valve is tricuspid. Aortic valve regurgitation is mild to  moderate. Aortic valve sclerosis is present, with no evidence of aortic  valve stenosis.   5. There is mild dilatation of the aortic root, measuring 43 mm. There is  mild dilatation of the ascending aorta, measuring 44 mm.   6. The inferior vena cava is normal in size with greater than 50%  respiratory variability, suggesting right atrial pressure of 3 mmHg.   EKG:  EKG is *** ordered today.  The ekg ordered today demonstrates ***  Recent Labs: 10/06/2021: ALT 39; TSH 2.310 12/11/2021: Hemoglobin 17.4; Platelets 166 05/26/2022: BUN 12; Creatinine,  Ser 1.12; Potassium 4.7; Sodium 133  Recent Lipid Panel    Component Value Date/Time   CHOL 170 10/06/2021 1443   TRIG 127 10/06/2021 1443   HDL 55 10/06/2021 1443   CHOLHDL 3.1 10/06/2021 1443   CHOLHDL 3 10/07/2020 1604   VLDL 54.6 (Walker) 10/07/2020 1604   LDLCALC 93 10/06/2021 1443   LDLDIRECT 90.0 10/07/2020 1604     Risk Assessment/Calculations:   {Does this patient have ATRIAL FIBRILLATION?:[phone]}   Physical Exam:    VS:  There were no vitals taken for this visit.    Wt Readings from Last 3 Encounters:  03/25/22 196 lb (88.9 kg)  02/25/22 194 lb (88 kg)  01/14/22 195 lb (88.5 kg)     GEN: *** Well nourished, well developed in no acute distress HEENT: Normal NECK: No JVD; No carotid bruits LYMPHATICS: No lymphadenopathy CARDIAC: ***RRR, no murmurs, rubs, gallops RESPIRATORY:  Clear to auscultation without rales, wheezing or rhonchi  ABDOMEN: Soft, non-tender, non-distended MUSCULOSKELETAL:  No edema; No deformity  SKIN: Warm and dry NEUROLOGIC:  Alert and oriented x  3 PSYCHIATRIC:  Normal affect   ASSESSMENT:    No diagnosis found. PLAN:    In order of problems listed above:  ***  Disposition: Follow up {follow up:15908} with ***   Shared Decision Making/Informed Consent   {Are you ordering Randy CV Procedure (e.g. stress test, cath, DCCV, TEE, etc)?   Press F2        :[id number]}    Signed, Randy Cefalu Ninfa Meeker, PA-C  06/28/2022 8:23 AM    [facility]

## 2022-06-29 ENCOUNTER — Other Ambulatory Visit: Payer: Self-pay

## 2022-06-29 ENCOUNTER — Encounter (HOSPITAL_COMMUNITY)
Admission: RE | Admit: 2022-06-29 | Discharge: 2022-06-29 | Disposition: A | Discharge status: Home / Self Care | Payer: 59 | Source: Ambulatory Visit | Attending: Urology | Admitting: Urology

## 2022-06-29 ENCOUNTER — Encounter (HOSPITAL_COMMUNITY): Payer: Self-pay

## 2022-06-29 VITALS — BP 125/89 | HR 99 | Temp 98.2°F | Resp 16 | Ht 74.0 in | Wt 191.4 lb

## 2022-06-29 DIAGNOSIS — Z01818 Encounter for other preprocedural examination: Secondary | ICD-10-CM

## 2022-06-29 DIAGNOSIS — Z789 Other specified health status: Secondary | ICD-10-CM | POA: Diagnosis not present

## 2022-06-29 DIAGNOSIS — Z01812 Encounter for preprocedural laboratory examination: Secondary | ICD-10-CM | POA: Insufficient documentation

## 2022-06-29 DIAGNOSIS — I2510 Atherosclerotic heart disease of native coronary artery without angina pectoris: Secondary | ICD-10-CM

## 2022-06-29 HISTORY — DX: Heart failure, unspecified: I50.9

## 2022-06-29 HISTORY — DX: Personal history of other malignant neoplasm of kidney: Z85.528

## 2022-06-29 LAB — COMPREHENSIVE METABOLIC PANEL
ALT: 22 U/L (ref 0–44)
AST: 25 U/L (ref 15–41)
Albumin: 3.9 g/dL (ref 3.5–5.0)
Alkaline Phosphatase: 72 U/L (ref 38–126)
Anion gap: 7 (ref 5–15)
BUN: 18 mg/dL (ref 8–23)
CO2: 22 mmol/L (ref 22–32)
Calcium: 9.5 mg/dL (ref 8.9–10.3)
Chloride: 108 mmol/L (ref 98–111)
Creatinine, Ser: 1.45 mg/dL — ABNORMAL HIGH (ref 0.61–1.24)
GFR, Estimated: 53 mL/min — ABNORMAL LOW (ref >60)
Glucose, Bld: 114 mg/dL — ABNORMAL HIGH (ref 70–99)
Potassium: 5.1 mmol/L (ref 3.5–5.1)
Sodium: 137 mmol/L (ref 135–145)
Total Bilirubin: 0.5 mg/dL (ref 0.3–1.2)
Total Protein: 7.3 g/dL (ref 6.5–8.1)

## 2022-06-29 NOTE — Progress Notes (Signed)
Patient on Jardiance for heart failure, no diagnosis of diabetes.  Discussed with Willeen Cass, NP who recommended patient stay on for surgery as he uses it for a diuretic.

## 2022-06-30 NOTE — Progress Notes (Signed)
Anesthesia Chart Review:   Case: [id number] Date/Time: 07/07/22 1415   Procedure: XI ROBOTIC PARTIAL ADRENALECTOMY (Right)   Anesthesia type: General   Pre-op diagnosis: RIGHT ADRENAL MASS   [facility]   Surgeons: Alexis Frock, MD       DISCUSSION: - Pt is 66 years old with hx NICM, HFrEF, dilated ascending aorta (70m on 11/12/21 echo, but echo 05/27/22 documents aorta normal in size), HTN, PE (2008), RCC (s/p partial L nephrectomy 2021), alcohol abuse, current smoker.   By notes, pt has chronic sinus tachycardia. Tachycardia is seen intermittently on EKG 10/06/21 going forward. By notes, pt had COVID in 07/2021 and recovered.   Reviewed case with Dr. JGlennon Mac Given pt's tachycardia since last fall and current adrenal mass, pt will need urine metanephrines to eval for pheochromocytoma prior to surgery. I notified Coni in Dr. MZettie Phooffice.    VS: BP 125/89   Pulse 99   Temp 36.8 C (Oral)   Resp 16   Ht '6\' 2"'$  (1.88 m)   Wt 86.8 kg   SpO2 99%   BMI 24.57 kg/m   PROVIDERS: - PCP is JBiagio Borg MD - Cardiologist is MKathlyn Sacramento MD. Last office visit 03/25/22 with CEd Blalock PA   LABS: Labs reviewed: Acceptable for surgery. (all labs ordered are listed, but only abnormal results are displayed)  Labs Reviewed  COMPREHENSIVE METABOLIC PANEL - Abnormal; Notable for the following components:      Result Value   Glucose, Bld 114 (*)    Creatinine, Ser 1.45 (*)    GFR, Estimated 53 (*)    All other components within normal limits    EKG 03/25/22: sinus tachycardia 123 bpm. LAFB. Nonspecific ST abnormality   CV: Echo 05/27/22:  1. Left ventricular ejection fraction, by estimation, is 40 to 45%. The left ventricle has mildly decreased function. The left ventricle demonstrates global hypokinesis. The average left ventricular global longitudinal strain is -15.8 %.  2. Right ventricular systolic function is normal. The right ventricular size is normal.   3. The mitral valve is normal in structure. No evidence of mitral valve regurgitation. No evidence of mitral stenosis.  4. The aortic valve is normal in structure. Aortic valve regurgitation is mild. No aortic stenosis is present.  5. The inferior vena cava is normal in size with greater than 50% respiratory variability, suggesting right atrial pressure of 3 mmHg.   Cardiac cath 12/15/21:  1.  Normal coronary arteries. 2.  Mildly reduced LV systolic function with an EF of 45%. 3.  Right heart catheterization showed normal filling pressures, normal pulmonary pressure and normal cardiac output.   Past Medical History:  Diagnosis Date   Adenomatous polyps 11/17/2011   Alcohol use 11/17/2011   ALLERGIC RHINITIS 09/10/2007   ANXIETY 09/10/2007   BACK PAIN 12/31/2010   CHF (congestive heart failure) [place]    DISC DISEASE, LUMBAR 12/31/2010   GERD (gastroesophageal reflux disease) 11/23/2016   History of kidney cancer    Hyperglycemia 11/23/2016   HYPERLIPIDEMIA 12/31/2010   Hypertension    Personal history of rectal adenomas 01/29/2008   PSORIASIS 12/31/2010   [place]oriatic arthritis ([place]    PULMONARY EMBOLISM, HX OF 09/10/2007   RASH-NONVESICULAR 04/23/2009   Renal mass    SMOKER 12/31/2010    Past Surgical History:  Procedure Laterality Date   LYMPH NODE DISSECTION Bilateral 08/27/2020   Procedure: RETROPERITONEAL LYMPH NODE DISSECTION;  Surgeon: MAlexis Frock MD;  [facility];  Service: Urology;  Laterality: Bilateral;   RIGHT/LEFT HEART CATH AND CORONARY ANGIOGRAPHY N/A 12/15/2021   Procedure: RIGHT/LEFT HEART CATH AND CORONARY ANGIOGRAPHY;  Surgeon: Wellington Hampshire, MD;  Location: [facility];  Service: Cardiovascular;  Laterality: N/A;   ROBOT ASSISTED LAPAROSCOPIC NEPHRECTOMY Left 08/27/2020   Procedure: XI ROBOTIC ASSISTED LAPAROSCOPIC RADICAL NEPHRECTOMY;  Surgeon: Alexis Frock, MD;  Location: [facility];  Service: Urology;  Laterality: Left;  3.5 HRS    ROTATOR CUFF REPAIR  Jan, 2010   [facility], Left   spleen repair     TOTAL HIP ARTHROPLASTY Left 09/09/2017   Procedure: LEFT TOTAL HIP ARTHROPLASTY ANTERIOR APPROACH;  Surgeon: Dorna Leitz, MD;  Location: [facility];  Service: Orthopedics;  Laterality: Left;   WISDOM TOOTH EXTRACTION      MEDICATIONS:  empagliflozin (JARDIANCE) 10 MG TABS tablet   ENTRESTO 24-26 MG   HUMIRA PEN 40 MG/0.4ML PNKT   metoprolol succinate (TOPROL XL) 100 MG 24 hr tablet   pantoprazole (PROTONIX) 40 MG tablet   spironolactone (ALDACTONE) 25 MG tablet   No current facility-administered medications for this encounter.   Willeen Cass, PhD, FNP-BC [facility]/Anesthesiology Phone: [phone] 07/02/2022 2:06 PM

## 2022-07-06 NOTE — Anesthesia Preprocedure Evaluation (Signed)
Anesthesia Evaluation  Patient identified by MRN, date of birth, ID band Patient awake    Reviewed: Allergy & Precautions, NPO status , Patient's Chart, lab work & pertinent test results  History of Anesthesia Complications Negative for: history of anesthetic complications  Airway Mallampati: II  TM Distance: >3 FB Neck ROM: Full    Dental  (+) Partial Lower, Dental Advisory Given   Pulmonary Current Smoker and Patient abstained from smoking.,    Pulmonary exam normal        Cardiovascular hypertension, Pt. on medications and Pt. on home beta blockers Normal cardiovascular exam Rhythm:Regular Rate:Normal  Echo 05/27/2022 FINDINGS Left Ventricle: Left ventricular ejection fraction, by estimation, is 40 to 45%. The left ventricle has mildly decreased function. The left ventricle demonstrates global hypokinesis. The average left ventricular global longitudinal strain is -15.8 %.  The left ventricular internal cavity size was normal in size. There is no left ventricular hypertrophy.  Right Ventricle: The right ventricular size is normal. No increase in right ventricular wall thickness. Right ventricular systolic function is normal.  Left Atrium: Left atrial size was normal in size.  Right Atrium: Right atrial size was normal in size.  Pericardium: There is no evidence of pericardial effusion.  Mitral Valve: The mitral valve is normal in structure. No evidence of mitral valve stenosis.  Tricuspid Valve: The tricuspid valve is normal in structure. Tricuspid valve regurgitation is not demonstrated. No evidence of tricuspid stenosis.  Aortic Valve: The aortic valve is normal in structure. Aortic valve regurgitation is mild. No aortic stenosis is present.  Pulmonic Valve: The pulmonic valve was normal in structure. Pulmonic valve regurgitation is not visualized. No evidence of pulmonic stenosis.  Aorta: The aortic root is  normal in size and structure.  Venous: The inferior vena cava is normal in size with greater than 50% respiratory variability, suggesting right atrial pressure of 3 mmHg.  IAS/Shunts: No atrial level shunt detected by color flow Doppler.  Cath 11/2021 .  There is mild left ventricular systolic dysfunction. .  LV end diastolic pressure is normal. .  The left ventricular ejection fraction is 45-50% by visual estimate.  1.  Normal coronary arteries. 2.  Mildly reduced LV systolic function with an EF of 45%. 3.  Right heart catheterization showed normal filling pressures, normal pulmonary pressure and normal cardiac output.  Recommendations: The patient has mild nonischemic cardiomyopathy.  Recommend continuing medical therapy.  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  Maximize the dose of Toprol as tolerated.     Neuro/Psych Anxiety    GI/Hepatic Neg liver ROS, GERD  Medicated,  Endo/Other  negative endocrine ROSAdrenal mass  Renal/GU negative Renal ROS     Musculoskeletal negative musculoskeletal ROS (+)   Abdominal   Peds  Hematology negative hematology ROS (+)   Anesthesia Other Findings   Reproductive/Obstetrics                           Anesthesia Physical  Anesthesia Plan  ASA: 3  Anesthesia Plan: General   Post-op Pain Management: Celebrex PO (pre-op)* and Tylenol PO (pre-op)*   Induction: Intravenous  PONV Risk Score and Plan: 3 and Ondansetron, Dexamethasone and Midazolam  Airway Management Planned: Oral ETT  Additional Equipment: Arterial line  Intra-op Plan:   Post-operative Plan: Extubation in OR  Informed Consent: I have reviewed the patients History and Physical, chart, labs and discussed the procedure including the risks, benefits and alternatives for the proposed anesthesia  with the patient or authorized representative who has indicated his/her understanding and acceptance.     Dental advisory  given  Plan Discussed with: Anesthesiologist, CRNA and Surgeon  Anesthesia Plan Comments: (2 IV's Discussed pt's tachycardia associated with the adrenal mass.  Dr. Tresa Moore feels confident this is metastatic renal cancer and alcoholism.  Does not believe this could be a pheo. )      Anesthesia Quick Evaluation

## 2022-07-07 ENCOUNTER — Encounter (HOSPITAL_COMMUNITY)
Admission: RE | Disposition: A | Discharge status: Home / Self Care | Payer: Self-pay | Source: Home / Self Care | Attending: Urology

## 2022-07-07 ENCOUNTER — Encounter (HOSPITAL_COMMUNITY): Payer: Self-pay | Admitting: Urology

## 2022-07-07 ENCOUNTER — Inpatient Hospital Stay (HOSPITAL_COMMUNITY)
Admission: RE | Admit: 2022-07-07 | Discharge: 2022-07-08 | DRG: 615 | Disposition: A | Discharge status: Home / Self Care | Payer: 59 | Attending: Urology | Admitting: Urology

## 2022-07-07 ENCOUNTER — Inpatient Hospital Stay (HOSPITAL_COMMUNITY): Payer: 59 | Admitting: Anesthesiology

## 2022-07-07 ENCOUNTER — Other Ambulatory Visit: Payer: Self-pay

## 2022-07-07 ENCOUNTER — Inpatient Hospital Stay (HOSPITAL_COMMUNITY): Payer: 59 | Admitting: Emergency Medicine

## 2022-07-07 ENCOUNTER — Other Ambulatory Visit (HOSPITAL_COMMUNITY): Payer: Self-pay

## 2022-07-07 DIAGNOSIS — F17210 Nicotine dependence, cigarettes, uncomplicated: Secondary | ICD-10-CM | POA: Diagnosis not present

## 2022-07-07 DIAGNOSIS — F419 Anxiety disorder, unspecified: Secondary | ICD-10-CM

## 2022-07-07 DIAGNOSIS — Z8249 Family history of ischemic heart disease and other diseases of the circulatory system: Secondary | ICD-10-CM

## 2022-07-07 DIAGNOSIS — L4050 Arthropathic psoriasis, unspecified: Secondary | ICD-10-CM | POA: Diagnosis present

## 2022-07-07 DIAGNOSIS — E119 Type 2 diabetes mellitus without complications: Secondary | ICD-10-CM | POA: Diagnosis present

## 2022-07-07 DIAGNOSIS — I10 Essential (primary) hypertension: Secondary | ICD-10-CM | POA: Diagnosis not present

## 2022-07-07 DIAGNOSIS — Z86711 Personal history of pulmonary embolism: Secondary | ICD-10-CM

## 2022-07-07 DIAGNOSIS — K219 Gastro-esophageal reflux disease without esophagitis: Secondary | ICD-10-CM | POA: Diagnosis present

## 2022-07-07 DIAGNOSIS — Z905 Acquired absence of kidney: Secondary | ICD-10-CM | POA: Diagnosis not present

## 2022-07-07 DIAGNOSIS — E785 Hyperlipidemia, unspecified: Secondary | ICD-10-CM | POA: Diagnosis present

## 2022-07-07 DIAGNOSIS — Z85528 Personal history of other malignant neoplasm of kidney: Secondary | ICD-10-CM

## 2022-07-07 DIAGNOSIS — E278 Other specified disorders of adrenal gland: Principal | ICD-10-CM | POA: Diagnosis present

## 2022-07-07 DIAGNOSIS — Z01818 Encounter for other preprocedural examination: Secondary | ICD-10-CM

## 2022-07-07 HISTORY — PX: ROBOTIC ADRENALECTOMY: SHX6407

## 2022-07-07 LAB — CBC
HCT: 44.8 % (ref 39.0–52.0)
Hemoglobin: 15.6 g/dL (ref 13.0–17.0)
MCH: 34.6 pg — ABNORMAL HIGH (ref 26.0–34.0)
MCHC: 34.8 g/dL (ref 30.0–36.0)
MCV: 99.3 fL (ref 80.0–100.0)
Platelets: 134 10*3/uL — ABNORMAL LOW (ref 150–400)
RBC: 4.51 MIL/uL (ref 4.22–5.81)
RDW: 12.4 % (ref 11.5–15.5)
WBC: 5.2 10*3/uL (ref 4.0–10.5)
nRBC: 0 % (ref 0.0–0.2)

## 2022-07-07 LAB — TYPE AND SCREEN
ABO/RH(D): O POS
Antibody Screen: NEGATIVE

## 2022-07-07 LAB — HEMOGLOBIN AND HEMATOCRIT, BLOOD
HCT: 39.6 % (ref 39.0–52.0)
Hemoglobin: 13.7 g/dL (ref 13.0–17.0)

## 2022-07-07 SURGERY — ADRENALECTOMY, ROBOT-ASSISTED
Anesthesia: General | Laterality: Right

## 2022-07-07 MED ORDER — SUGAMMADEX SODIUM 200 MG/2ML IV SOLN
INTRAVENOUS | Status: DC | PRN
  Administered 2022-07-07: 400 mg via INTRAVENOUS

## 2022-07-07 MED ORDER — MIDAZOLAM HCL 5 MG/5ML IJ SOLN
INTRAMUSCULAR | Status: DC | PRN
  Administered 2022-07-07: 2 mg via INTRAVENOUS

## 2022-07-07 MED ORDER — HYDROMORPHONE HCL 1 MG/ML IJ SOLN
0.5000 mg | INTRAMUSCULAR | Status: DC | PRN
  Administered 2022-07-07 – 2022-07-08 (×5): 1 mg via INTRAVENOUS
  Filled 2022-07-07 (×5): qty 1

## 2022-07-07 MED ORDER — PANTOPRAZOLE SODIUM 40 MG PO TBEC
40.0000 mg | DELAYED_RELEASE_TABLET | Freq: Every day | ORAL | Status: DC
Start: 2022-07-08 — End: 2022-07-08
  Administered 2022-07-08: 40 mg via ORAL
  Filled 2022-07-07: qty 1

## 2022-07-07 MED ORDER — ACETAMINOPHEN 500 MG PO TABS
1000.0000 mg | ORAL_TABLET | Freq: Once | ORAL | Status: AC
  Administered 2022-07-07: 1000 mg via ORAL
  Filled 2022-07-07: qty 2

## 2022-07-07 MED ORDER — MIDAZOLAM HCL 2 MG/2ML IJ SOLN
1.0000 mg | Freq: Once | INTRAMUSCULAR | Status: AC
  Administered 2022-07-07: 4 mg via INTRAVENOUS
  Filled 2022-07-07: qty 2

## 2022-07-07 MED ORDER — EMPAGLIFLOZIN 10 MG PO TABS
10.0000 mg | ORAL_TABLET | Freq: Every day | ORAL | Status: DC
  Administered 2022-07-08: 10 mg via ORAL
  Filled 2022-07-07: qty 1

## 2022-07-07 MED ORDER — ORAL CARE MOUTH RINSE: 15.0000 mL | Freq: Once | OROMUCOSAL | Status: AC

## 2022-07-07 MED ORDER — CEFAZOLIN SODIUM-DEXTROSE 2-4 GM/100ML-% IV SOLN
2.0000 g | INTRAVENOUS | Status: AC
  Administered 2022-07-07: 2 g via INTRAVENOUS
  Filled 2022-07-07: qty 100

## 2022-07-07 MED ORDER — PHENYLEPHRINE HCL (PRESSORS) 10 MG/ML IV SOLN
INTRAVENOUS | Status: AC
  Filled 2022-07-07: qty 1

## 2022-07-07 MED ORDER — HYDROMORPHONE HCL 1 MG/ML IJ SOLN
INTRAMUSCULAR | Status: DC | PRN
  Administered 2022-07-07: 1 mg via INTRAVENOUS

## 2022-07-07 MED ORDER — PROMETHAZINE HCL 25 MG/ML IJ SOLN: 6.2500 mg | INTRAMUSCULAR | Status: DC | PRN

## 2022-07-07 MED ORDER — ROCURONIUM BROMIDE 10 MG/ML (PF) SYRINGE
PREFILLED_SYRINGE | INTRAVENOUS | Status: AC
  Filled 2022-07-07: qty 10

## 2022-07-07 MED ORDER — FENTANYL CITRATE PF 50 MCG/ML IJ SOSY
50.0000 ug | PREFILLED_SYRINGE | Freq: Once | INTRAMUSCULAR | Status: AC
  Administered 2022-07-07: 100 ug via INTRAVENOUS
  Filled 2022-07-07: qty 2

## 2022-07-07 MED ORDER — FENTANYL CITRATE (PF) 100 MCG/2ML IJ SOLN
INTRAMUSCULAR | Status: AC
  Filled 2022-07-07: qty 2

## 2022-07-07 MED ORDER — MIDAZOLAM HCL 2 MG/2ML IJ SOLN
INTRAMUSCULAR | Status: AC
  Filled 2022-07-07: qty 2

## 2022-07-07 MED ORDER — DIPHENHYDRAMINE HCL 50 MG/ML IJ SOLN: 12.5000 mg | Freq: Four times a day (QID) | INTRAMUSCULAR | Status: DC | PRN

## 2022-07-07 MED ORDER — PHENYLEPHRINE 80 MCG/ML (10ML) SYRINGE FOR IV PUSH (FOR BLOOD PRESSURE SUPPORT)
PREFILLED_SYRINGE | INTRAVENOUS | Status: DC | PRN
  Administered 2022-07-07: 160 ug via INTRAVENOUS
  Administered 2022-07-07: 240 ug via INTRAVENOUS
  Administered 2022-07-07: 160 ug via INTRAVENOUS

## 2022-07-07 MED ORDER — ONDANSETRON HCL 4 MG/2ML IJ SOLN
INTRAMUSCULAR | Status: AC
Start: 2022-07-07 — End: ?
  Filled 2022-07-07: qty 2

## 2022-07-07 MED ORDER — ONDANSETRON HCL 4 MG/2ML IJ SOLN: 4.0000 mg | INTRAMUSCULAR | Status: DC | PRN

## 2022-07-07 MED ORDER — FENTANYL CITRATE (PF) 100 MCG/2ML IJ SOLN
INTRAMUSCULAR | Status: DC | PRN
  Administered 2022-07-07: 50 ug via INTRAVENOUS
  Administered 2022-07-07: 100 ug via INTRAVENOUS
  Administered 2022-07-07: 50 ug via INTRAVENOUS

## 2022-07-07 MED ORDER — HYDROCODONE-ACETAMINOPHEN 5-325 MG PO TABS
1.0000 | ORAL_TABLET | Freq: Four times a day (QID) | ORAL | 0 refills | Status: DC | PRN
  Filled 2022-07-07: qty 20, 3d supply, fill #0

## 2022-07-07 MED ORDER — METOPROLOL SUCCINATE ER 100 MG PO TB24
100.0000 mg | ORAL_TABLET | Freq: Every day | ORAL | Status: DC
Start: 2022-07-08 — End: 2022-07-08
  Administered 2022-07-08: 100 mg via ORAL
  Filled 2022-07-07: qty 1

## 2022-07-07 MED ORDER — HYDROMORPHONE HCL 2 MG/ML IJ SOLN
INTRAMUSCULAR | Status: AC
  Filled 2022-07-07: qty 1

## 2022-07-07 MED ORDER — DEXAMETHASONE SODIUM PHOSPHATE 10 MG/ML IJ SOLN
INTRAMUSCULAR | Status: AC
  Filled 2022-07-07: qty 1

## 2022-07-07 MED ORDER — DOCUSATE SODIUM 100 MG PO CAPS
100.0000 mg | ORAL_CAPSULE | Freq: Two times a day (BID) | ORAL | Status: DC
  Administered 2022-07-07 – 2022-07-08 (×2): 100 mg via ORAL
  Filled 2022-07-07 (×2): qty 1

## 2022-07-07 MED ORDER — PROPOFOL 10 MG/ML IV BOLUS
INTRAVENOUS | Status: AC
  Filled 2022-07-07: qty 20

## 2022-07-07 MED ORDER — CELECOXIB 200 MG PO CAPS
200.0000 mg | ORAL_CAPSULE | Freq: Once | ORAL | Status: AC
  Administered 2022-07-07: 200 mg via ORAL
  Filled 2022-07-07: qty 1

## 2022-07-07 MED ORDER — STERILE WATER FOR IRRIGATION IR SOLN
Status: DC | PRN
  Administered 2022-07-07: 1000 mL

## 2022-07-07 MED ORDER — ALBUMIN HUMAN 5 % IV SOLN
INTRAVENOUS | Status: AC
  Filled 2022-07-07: qty 250

## 2022-07-07 MED ORDER — DOCUSATE SODIUM 100 MG PO CAPS
100.0000 mg | ORAL_CAPSULE | Freq: Two times a day (BID) | ORAL | Status: AC
Start: 2022-07-07 — End: ?

## 2022-07-07 MED ORDER — AMISULPRIDE (ANTIEMETIC) 5 MG/2ML IV SOLN: 10.0000 mg | Freq: Once | INTRAVENOUS | Status: DC | PRN

## 2022-07-07 MED ORDER — DIPHENHYDRAMINE HCL 12.5 MG/5ML PO ELIX: 12.5000 mg | ORAL_SOLUTION | Freq: Four times a day (QID) | ORAL | Status: DC | PRN

## 2022-07-07 MED ORDER — HYOSCYAMINE SULFATE 0.125 MG SL SUBL: 0.1250 mg | SUBLINGUAL_TABLET | SUBLINGUAL | Status: DC | PRN

## 2022-07-07 MED ORDER — SODIUM CHLORIDE (PF) 0.9 % IJ SOLN
INTRAMUSCULAR | Status: DC | PRN
  Administered 2022-07-07: 20 mL

## 2022-07-07 MED ORDER — BUPIVACAINE LIPOSOME 1.3 % IJ SUSP
INTRAMUSCULAR | Status: AC
  Filled 2022-07-07: qty 20

## 2022-07-07 MED ORDER — POLYETHYLENE GLYCOL 3350 17 GM/SCOOP PO POWD: 1.0000 | Freq: Once | ORAL | Status: DC

## 2022-07-07 MED ORDER — SPIRITUS FRUMENTI
1.0000 | Freq: Three times a day (TID) | ORAL | Status: DC
  Administered 2022-07-07 – 2022-07-08 (×2): 1 via ORAL
  Filled 2022-07-07 (×5): qty 1

## 2022-07-07 MED ORDER — PROPOFOL 10 MG/ML IV BOLUS
INTRAVENOUS | Status: DC | PRN
  Administered 2022-07-07: 200 mg via INTRAVENOUS

## 2022-07-07 MED ORDER — SACUBITRIL-VALSARTAN 24-26 MG PO TABS
1.0000 | ORAL_TABLET | Freq: Two times a day (BID) | ORAL | Status: DC
  Administered 2022-07-07 – 2022-07-08 (×2): 1 via ORAL
  Filled 2022-07-07 (×2): qty 1

## 2022-07-07 MED ORDER — SPIRONOLACTONE 12.5 MG HALF TABLET
12.5000 mg | ORAL_TABLET | Freq: Every day | ORAL | Status: DC
  Administered 2022-07-08: 12.5 mg via ORAL
  Filled 2022-07-07: qty 1

## 2022-07-07 MED ORDER — SODIUM CHLORIDE 0.45 % IV SOLN: INTRAVENOUS | Status: DC

## 2022-07-07 MED ORDER — ALBUMIN HUMAN 5 % IV SOLN: INTRAVENOUS | Status: DC | PRN

## 2022-07-07 MED ORDER — FENTANYL CITRATE PF 50 MCG/ML IJ SOSY
25.0000 ug | PREFILLED_SYRINGE | INTRAMUSCULAR | Status: DC | PRN
  Administered 2022-07-07: 50 ug via INTRAVENOUS

## 2022-07-07 MED ORDER — LIDOCAINE 2% (20 MG/ML) 5 ML SYRINGE
INTRAMUSCULAR | Status: AC
  Filled 2022-07-07: qty 5

## 2022-07-07 MED ORDER — HEMOSTATIC AGENTS (NO CHARGE) OPTIME
TOPICAL | Status: DC | PRN
  Administered 2022-07-07: 1

## 2022-07-07 MED ORDER — TRIPLE ANTIBIOTIC 3.5-400-5000 EX OINT: 1.0000 | TOPICAL_OINTMENT | Freq: Three times a day (TID) | CUTANEOUS | Status: DC | PRN

## 2022-07-07 MED ORDER — LACTATED RINGERS IR SOLN
Status: DC | PRN
  Administered 2022-07-07: 1000 mL

## 2022-07-07 MED ORDER — PHENYLEPHRINE HCL-NACL 20-0.9 MG/250ML-% IV SOLN
INTRAVENOUS | Status: DC | PRN
  Administered 2022-07-07: 30 ug/min via INTRAVENOUS

## 2022-07-07 MED ORDER — SODIUM CHLORIDE (PF) 0.9 % IJ SOLN
INTRAMUSCULAR | Status: AC
  Filled 2022-07-07: qty 20

## 2022-07-07 MED ORDER — ACETAMINOPHEN 500 MG PO TABS
1000.0000 mg | ORAL_TABLET | Freq: Four times a day (QID) | ORAL | Status: AC
  Administered 2022-07-07 – 2022-07-08 (×4): 1000 mg via ORAL
  Filled 2022-07-07 (×4): qty 2

## 2022-07-07 MED ORDER — CHLORHEXIDINE GLUCONATE 0.12 % MT SOLN
15.0000 mL | Freq: Once | OROMUCOSAL | Status: AC
  Administered 2022-07-07: 15 mL via OROMUCOSAL

## 2022-07-07 MED ORDER — BUPIVACAINE LIPOSOME 1.3 % IJ SUSP
INTRAMUSCULAR | Status: DC | PRN
  Administered 2022-07-07: 20 mL

## 2022-07-07 MED ORDER — PHENYLEPHRINE 80 MCG/ML (10ML) SYRINGE FOR IV PUSH (FOR BLOOD PRESSURE SUPPORT)
PREFILLED_SYRINGE | INTRAVENOUS | Status: AC
  Filled 2022-07-07: qty 10

## 2022-07-07 MED ORDER — DEXAMETHASONE SODIUM PHOSPHATE 10 MG/ML IJ SOLN
INTRAMUSCULAR | Status: DC | PRN
  Administered 2022-07-07: 10 mg via INTRAVENOUS

## 2022-07-07 MED ORDER — LIDOCAINE 2% (20 MG/ML) 5 ML SYRINGE
INTRAMUSCULAR | Status: DC | PRN
  Administered 2022-07-07: 100 mg via INTRAVENOUS

## 2022-07-07 MED ORDER — ONDANSETRON HCL 4 MG/2ML IJ SOLN
INTRAMUSCULAR | Status: DC | PRN
  Administered 2022-07-07: 4 mg via INTRAVENOUS

## 2022-07-07 MED ORDER — FENTANYL CITRATE PF 50 MCG/ML IJ SOSY
PREFILLED_SYRINGE | INTRAMUSCULAR | Status: AC
  Filled 2022-07-07: qty 1

## 2022-07-07 MED ORDER — OXYCODONE HCL 5 MG PO TABS
5.0000 mg | ORAL_TABLET | ORAL | Status: DC | PRN
  Administered 2022-07-07 – 2022-07-08 (×2): 5 mg via ORAL
  Filled 2022-07-07 (×2): qty 1

## 2022-07-07 MED ORDER — ROCURONIUM BROMIDE 10 MG/ML (PF) SYRINGE
PREFILLED_SYRINGE | INTRAVENOUS | Status: DC | PRN
  Administered 2022-07-07: 100 mg via INTRAVENOUS

## 2022-07-07 MED ORDER — LACTATED RINGERS IV SOLN: INTRAVENOUS | Status: DC

## 2022-07-07 SURGICAL SUPPLY — 70 items
ADH SKN CLS APL DERMABOND .7 (GAUZE/BANDAGES/DRESSINGS) ×2
AGENT HMST KT MTR STRL THRMB (HEMOSTASIS) ×1
APL ESCP 34 STRL LF DISP (HEMOSTASIS) ×1
APL PRP STRL LF DISP 70% ISPRP (MISCELLANEOUS) ×1
APPLICATOR SURGIFLO ENDO (HEMOSTASIS) ×1 IMPLANT
BAG COUNTER SPONGE SURGICOUNT (BAG) ×2 IMPLANT
BAG LAPAROSCOPIC 12 15 PORT 16 (BASKET) IMPLANT
BAG RETRIEVAL 12/15 (BASKET)
BAG SPNG CNTER NS LX DISP (BAG) ×1
CHLORAPREP W/TINT 26 (MISCELLANEOUS) ×2 IMPLANT
CLIP LIGATING HEM O LOK PURPLE (MISCELLANEOUS) ×2 IMPLANT
CLIP LIGATING HEMO LOK XL GOLD (MISCELLANEOUS) ×2 IMPLANT
CLIP LIGATING HEMO O LOK GREEN (MISCELLANEOUS) ×2 IMPLANT
COVER SURGICAL LIGHT HANDLE (MISCELLANEOUS) ×2 IMPLANT
COVER TIP SHEARS 8 DVNC (MISCELLANEOUS) ×1 IMPLANT
COVER TIP SHEARS 8MM DA VINCI (MISCELLANEOUS) ×2
CUTTER ECHEON FLEX ENDO 45 340 (ENDOMECHANICALS) ×1 IMPLANT
DERMABOND ADVANCED (GAUZE/BANDAGES/DRESSINGS) ×2
DERMABOND ADVANCED .7 DNX12 (GAUZE/BANDAGES/DRESSINGS) ×2 IMPLANT
DRAIN CHANNEL 15F RND FF 3/16 (WOUND CARE) IMPLANT
DRAPE ARM DVNC X/XI (DISPOSABLE) ×4 IMPLANT
DRAPE COLUMN DVNC XI (DISPOSABLE) ×1 IMPLANT
DRAPE DA VINCI XI ARM (DISPOSABLE) ×8
DRAPE DA VINCI XI COLUMN (DISPOSABLE) ×2
DRAPE INCISE IOBAN 66X45 STRL (DRAPES) ×2 IMPLANT
DRAPE SHEET LG 3/4 BI-LAMINATE (DRAPES) ×2 IMPLANT
ELECT PENCIL ROCKER SW 15FT (MISCELLANEOUS) ×2 IMPLANT
ELECT REM PT RETURN 15FT ADLT (MISCELLANEOUS) ×2 IMPLANT
EVACUATOR SILICONE 100CC (DRAIN) IMPLANT
GLOVE BIO SURGEON STRL SZ 6.5 (GLOVE) ×2 IMPLANT
GLOVE SURG LX 7.5 STRW (GLOVE) ×2
GLOVE SURG LX STRL 7.5 STRW (GLOVE) ×2 IMPLANT
GOWN SRG XL LVL 4 BRTHBL STRL (GOWNS) ×1 IMPLANT
GOWN STRL NON-REIN XL LVL4 (GOWNS) ×2
HEMOSTAT SURGICEL 4X8 (HEMOSTASIS) ×1 IMPLANT
IRRIG SUCT STRYKERFLOW 2 WTIP (MISCELLANEOUS) ×2
IRRIGATION SUCT STRKRFLW 2 WTP (MISCELLANEOUS) ×1 IMPLANT
KIT BASIN OR (CUSTOM PROCEDURE TRAY) ×2 IMPLANT
KIT TURNOVER KIT A (KITS) IMPLANT
LOOP VESSEL MAXI BLUE (MISCELLANEOUS) IMPLANT
NDL INSUFFLATION 14GA 120MM (NEEDLE) ×1 IMPLANT
NEEDLE INSUFFLATION 14GA 120MM (NEEDLE) ×2 IMPLANT
PROTECTOR NERVE ULNAR (MISCELLANEOUS) ×4 IMPLANT
RELOAD STAPLE 45 2.6 WHT THIN (STAPLE) IMPLANT
SEAL CANN UNIV 5-8 DVNC XI (MISCELLANEOUS) ×4 IMPLANT
SEAL XI 5MM-8MM UNIVERSAL (MISCELLANEOUS) ×8
SET TRI-LUMEN FLTR TB AIRSEAL (TUBING) ×2 IMPLANT
SOLUTION ELECTROLUBE (MISCELLANEOUS) ×2 IMPLANT
SPIKE FLUID TRANSFER (MISCELLANEOUS) ×2 IMPLANT
SPONGE T-LAP 4X18 ~~LOC~~+RFID (SPONGE) ×2 IMPLANT
STAPLE RELOAD 45 WHT (STAPLE) ×1 IMPLANT
STAPLE RELOAD 45MM WHITE (STAPLE) ×2
SURGIFLO W/THROMBIN 8M KIT (HEMOSTASIS) ×1 IMPLANT
SUT ETHILON 3 0 PS 1 (SUTURE) IMPLANT
SUT MNCRL AB 4-0 PS2 18 (SUTURE) ×4 IMPLANT
SUT PDS AB 1 CT1 27 (SUTURE) ×6 IMPLANT
SUT PROLENE 4 0 RB 1 (SUTURE)
SUT PROLENE 4-0 RB1 .5 CRCL 36 (SUTURE) IMPLANT
SUT VICRYL 0 UR6 27IN ABS (SUTURE) IMPLANT
SYS BAG RETRIEVAL 10MM (BASKET) ×2
SYSTEM BAG RETRIEVAL 10MM (BASKET) IMPLANT
TOWEL OR 17X26 10 PK STRL BLUE (TOWEL DISPOSABLE) ×2 IMPLANT
TOWEL OR NON WOVEN STRL DISP B (DISPOSABLE) ×2 IMPLANT
TRAY FOLEY MTR SLVR 16FR STAT (SET/KITS/TRAYS/PACK) ×2 IMPLANT
TRAY LAPAROSCOPIC (CUSTOM PROCEDURE TRAY) ×2 IMPLANT
TROCAR ADV FIXATION 12X100MM (TROCAR) ×2 IMPLANT
TROCAR PORT AIRSEAL 12X150 (TUBING) ×1 IMPLANT
TROCAR UNIVERSAL OPT 12M 100M (ENDOMECHANICALS) ×2 IMPLANT
TROCAR Z-THREAD OPTICAL 5X100M (TROCAR) ×1 IMPLANT
WATER STERILE IRR 1000ML POUR (IV SOLUTION) ×2 IMPLANT

## 2022-07-07 NOTE — Transfer of Care (Signed)
Immediate Anesthesia Transfer of Care Note  Patient: Randy Walker  Procedure(s) Performed: XI ROBOTIC PARTIAL ADRENALECTOMY, INTRAOPERATIVE ULTRASOUND (Right)  Patient Location: PACU  Anesthesia Type:General  Level of Consciousness: drowsy and patient cooperative  Airway & Oxygen Therapy: Patient Spontanous Breathing and Patient connected to face mask oxygen  Post-op Assessment: Report given to RN and Post -op Vital signs reviewed and stable  Post vital signs: Reviewed and stable  Last Vitals:  Vitals Value Taken Time  BP 149/99 07/07/22 1704  Temp    Pulse 72 07/07/22 1707  Resp 12 07/07/22 1707  SpO2 100 % 07/07/22 1707  Vitals shown include unvalidated device data.  Last Pain:  Vitals:   07/07/22 1355  TempSrc:   PainSc: 0-No pain      Patients Stated Pain Goal: 3 (25/18/98 4210)  Complications: No notable events documented.

## 2022-07-07 NOTE — Anesthesia Procedure Notes (Signed)
Arterial Line Insertion Start/End8/16/2023 1:41 PM, 07/07/2022 1:51 PM Performed by: Duane Boston, MD  Patient location: Pre-op. Preanesthetic checklist: patient identified, IV checked, site marked, risks and benefits discussed, surgical consent, monitors and equipment checked, pre-op evaluation, timeout performed and anesthesia consent Lidocaine 1% used for infiltration radial was placed Catheter size: 20 Fr Hand hygiene performed  and maximum sterile barriers used   Attempts: 1 Procedure performed using ultrasound guided technique. Ultrasound Notes:anatomy identified, needle tip was noted to be adjacent to the nerve/plexus identified, no ultrasound evidence of intravascular and/or intraneural injection and image(s) printed for medical record Following insertion, dressing applied and Biopatch. Post procedure assessment: normal and unchanged  Patient tolerated the procedure well with no immediate complications.

## 2022-07-07 NOTE — Anesthesia Procedure Notes (Signed)
Procedure Name: Intubation Date/Time: 07/07/2022 2:51 PM  Performed by: Lind Covert, CRNAPre-anesthesia Checklist: Patient identified, Emergency Drugs available, Suction available, Patient being monitored and Timeout performed Patient Re-evaluated:Patient Re-evaluated prior to induction Oxygen Delivery Method: Circle system utilized Preoxygenation: Pre-oxygenation with 100% oxygen Induction Type: IV induction Ventilation: Mask ventilation without difficulty Laryngoscope Size: Mac and 4 Grade View: Grade I Tube type: Oral Tube size: 8.0 mm Number of attempts: 1 Airway Equipment and Method: Stylet Placement Confirmation: ETT inserted through vocal cords under direct vision, positive ETCO2 and breath sounds checked- equal and bilateral Secured at: 23 cm Tube secured with: Tape Dental Injury: Teeth and Oropharynx as per pre-operative assessment

## 2022-07-07 NOTE — Anesthesia Postprocedure Evaluation (Signed)
Anesthesia Post Note  Patient: Randy Walker  Procedure(s) Performed: XI ROBOTIC PARTIAL ADRENALECTOMY, INTRAOPERATIVE ULTRASOUND (Right)     Patient location during evaluation: PACU Anesthesia Type: General Level of consciousness: sedated Pain management: pain level controlled Vital Signs Assessment: post-procedure vital signs reviewed and stable Respiratory status: spontaneous breathing and respiratory function stable Cardiovascular status: stable Postop Assessment: no apparent nausea or vomiting Anesthetic complications: no   No notable events documented.  Last Vitals:  Vitals:   07/07/22 1745 07/07/22 1800  BP: (!) 144/84 115/80  Pulse: 79 77  Resp: (!) 8 10  Temp:  (!) 36.4 C  SpO2: 94% 95%    Last Pain:  Vitals:   07/07/22 1800  TempSrc:   PainSc: Asleep                 Nica Friske DANIEL

## 2022-07-07 NOTE — Discharge Instructions (Signed)

## 2022-07-07 NOTE — Brief Op Note (Signed)
07/07/2022  4:48 PM  PATIENT:  Randy Walker  66 y.o. male  PRE-OPERATIVE DIAGNOSIS:  RIGHT ADRENAL MASS  POST-OPERATIVE DIAGNOSIS:  RIGHT ADRENAL MASS  PROCEDURE:  Procedure(s): XI ROBOTIC PARTIAL ADRENALECTOMY, INTRAOPERATIVE ULTRASOUND (Right)  SURGEON:  Surgeon(s) and Role:    Alexis Frock, MD - Primary  PHYSICIAN ASSISTANT:   ASSISTANTS: Debbrah Alar PA   ANESTHESIA:   local and general  EBL:  60 mL   BLOOD ADMINISTERED:none  DRAINS:  foley to gravity    LOCAL MEDICATIONS USED:  MARCAINE     SPECIMEN:  Source of Specimen:  Rt partial adrenal gland with mass  DISPOSITION OF SPECIMEN:  PATHOLOGY  COUNTS:  YES  TOURNIQUET:  * No tourniquets in log *  DICTATION: .Other Dictation: Dictation Number [id number]  PLAN OF CARE: Admit for overnight observation  PATIENT DISPOSITION:  PACU - hemodynamically stable.   Delay start of Pharmacological VTE agent (>24hrs) due to surgical blood loss or risk of bleeding: yes

## 2022-07-07 NOTE — H&P (Signed)
Randy Walker is an 66 y.o. male.    Chief Complaint: Pre-Op RIGHT Partial Adrenalectomy  HPI:   1 -Stage 3 LEFT Renal Cancer - s/p robotic left radical nephrectomy with retroperitoneal node dissection 08/2020 for [id number] Grade 2 clear cell cancer with negative margins. Several borderline periaortic nodes negative for cancer. Pre-op Staging otherwise localized.   Post-Nephrectomy Course:  05/2021 - CMP Nl, Cr 1.0, CT chest/abd NO recurrence  11/2021 - CMP Nl, Cr 0.8, CT chest/abd 1.4cm RIGHT adrenal nodule (non-specific at this point).  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 Overall renal function remains remarkably good as per above.   3- Right Adrenal Mass - 1.4cm Rt adrenal nodule on CT 11/2021 h/o left renal cancer as per above. Progressive as per above.   PMH sig for psoriasis/Humira (held since around 12/2019), HTN, DM2 (A1c <6). 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, then [employer]). Addictive tendencies (he tries do avoid narcotic pain meds if he can). His PCP is Cathlean Cower MD with [facility].   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   Past Medical History:  Diagnosis Date   Adenomatous polyps 11/17/2011   Alcohol use 11/17/2011   ALLERGIC RHINITIS 09/10/2007   ANXIETY 09/10/2007   BACK PAIN 12/31/2010   CHF (congestive heart failure) ([place])    DISC DISEASE, LUMBAR 12/31/2010   GERD (gastroesophageal reflux disease) 11/23/2016   History of kidney cancer    Hyperglycemia 11/23/2016   HYPERLIPIDEMIA 12/31/2010   Hypertension    Personal history of rectal adenomas 01/29/2008   PSORIASIS 12/31/2010   Psoriatic arthritis ([place])    PULMONARY EMBOLISM, HX OF 09/10/2007   RASH-NONVESICULAR 04/23/2009   Renal mass    SMOKER 12/31/2010     Past Surgical History:  Procedure Laterality Date   LYMPH NODE DISSECTION Bilateral 08/27/2020   Procedure: RETROPERITONEAL LYMPH NODE DISSECTION;  Surgeon: Alexis Frock, MD;  Location: [facility];  Service: Urology;  Laterality: Bilateral;   RIGHT/LEFT HEART CATH AND CORONARY ANGIOGRAPHY N/A 12/15/2021   Procedure: RIGHT/LEFT HEART CATH AND CORONARY ANGIOGRAPHY;  Surgeon: Wellington Hampshire, MD;  Location: [place] [facility];  Service: Cardiovascular;  Laterality: N/A;   ROBOT ASSISTED LAPAROSCOPIC NEPHRECTOMY Left 08/27/2020   Procedure: XI ROBOTIC ASSISTED LAPAROSCOPIC RADICAL NEPHRECTOMY;  Surgeon: Alexis Frock, MD;  Location: [facility];  Service: Urology;  Laterality: Left;  3.5 HRS   ROTATOR CUFF REPAIR  Jan, 2010   [facility], Left   spleen repair     TOTAL HIP ARTHROPLASTY Left 09/09/2017   Procedure: LEFT TOTAL HIP ARTHROPLASTY ANTERIOR APPROACH;  Surgeon: Dorna Leitz, MD;  Location: [facility];  Service: Orthopedics;  Laterality: Left;   WISDOM TOOTH EXTRACTION      Family History  Problem Relation Age of Onset   Heart attack Father    Colon cancer Neg Hx    Esophageal cancer Neg Hx    Rectal cancer Neg Hx    Stomach cancer Neg Hx    Social History:  reports that he has been smoking cigarettes. He has a 10.00 pack-year smoking history. He has never used smokeless tobacco. He reports current alcohol use of about 21.0 standard drinks of alcohol per week. He reports that he does not use drugs.  Allergies: No Known Allergies  No medications prior to admission.    No results found for this or any previous visit (from the past 48 hour(s)). No results found.  Review of Systems  Constitutional:  Negative for chills and fever.  All other systems reviewed and are negative.   There were no vitals taken for this visit. Physical Exam Vitals reviewed.  HENT:     Head: Normocephalic.  Eyes:     Pupils: Pupils are equal, round, and reactive to light.  Cardiovascular:     Rate  and Rhythm: Normal rate.  Abdominal:     General: Abdomen is flat.     Comments: Prior scars w/o hernia  Genitourinary:    Comments: No CVAT Musculoskeletal:     Cervical back: Normal range of motion.  Skin:    General: Skin is warm.  Neurological:     Mental Status: He is alert.  Psychiatric:        Mood and Affect: Mood normal.      Assessment/Plan  Proceed as planned with robotic right partial adrenalectomy. Risks, benefits, alternatives, expected peri-op course discussed previously and reiterated today.   Alexis Frock, MD 07/07/2022, 7:16 AM

## 2022-07-08 ENCOUNTER — Other Ambulatory Visit (HOSPITAL_COMMUNITY): Payer: Self-pay

## 2022-07-08 ENCOUNTER — Encounter (HOSPITAL_COMMUNITY): Payer: Self-pay | Admitting: Urology

## 2022-07-08 ENCOUNTER — Other Ambulatory Visit: Payer: Self-pay

## 2022-07-08 LAB — BASIC METABOLIC PANEL
Anion gap: 7 (ref 5–15)
BUN: 18 mg/dL (ref 8–23)
CO2: 23 mmol/L (ref 22–32)
Calcium: 8.6 mg/dL — ABNORMAL LOW (ref 8.9–10.3)
Chloride: 107 mmol/L (ref 98–111)
Creatinine, Ser: 1.45 mg/dL — ABNORMAL HIGH (ref 0.61–1.24)
GFR, Estimated: 53 mL/min — ABNORMAL LOW (ref >60)
Glucose, Bld: 152 mg/dL — ABNORMAL HIGH (ref 70–99)
Potassium: 4.3 mmol/L (ref 3.5–5.1)
Sodium: 137 mmol/L (ref 135–145)

## 2022-07-08 LAB — HEMOGLOBIN AND HEMATOCRIT, BLOOD
HCT: 40.2 % (ref 39.0–52.0)
Hemoglobin: 13.8 g/dL (ref 13.0–17.0)

## 2022-07-08 NOTE — Plan of Care (Signed)
Discharge instructions reviewed with patient, questions answered verbalized understanding.  Patient ambulatory to main entrance to be taken home by wife.  Pain medication brought to room by outpatient pharmacy for patient to take home.

## 2022-07-08 NOTE — Progress Notes (Signed)
  Transition of Care [facility]) Screening Note   Patient Details  Name: MATTIAS WALMSLEY Date of Birth: [birth year]   [facility] CM/SW Contact:    Roseanne Kaufman, RN Phone Number: 07/08/2022, 10:52 AM    Transition of Care Department [facility]) has reviewed patient and no TOC needs have been identified at this time. We will continue to monitor patient advancement through interdisciplinary progression rounds. If new patient transition needs arise, please place a TOC consult.

## 2022-07-08 NOTE — Op Note (Signed)
NAMEROMEN, YUTZY MEDICAL RECORD NO: [id number] ACCOUNT NO: [id number] DATE OF BIRTH: Dec 16, 1955 FACILITY: [facility]ATION[facility] PHYSICIAN: Alexis Frock, MD  Operative Report   PREOPERATIVE DIAGNOSES:  Enlarging right adrenal mass, history of aggressive left renal cancer.  PROCEDURES:  1.  Robotic-assisted laparoscopic right partial adrenalectomy. 2.  Intraoperative ultrasound interpretation.  ESTIMATED BLOOD LOSS:  50 mL.  COMPLICATIONS:  None.  SPECIMEN:  Right partial adrenal gland with mass.  FINDINGS:  1.  Hypoechoic well-circumscribed right lateral limb adrenal mass on intraoperative ultrasound. 2.  Two artery, one vein right renal vascular anatomy as anticipated.  ASSISTANT:  Debbrah Alar, PA  DRAINS:  Foley catheter to straight drain.  INDICATIONS:  The patient is a pleasant 66 year old man with history of aggressive left renal cancer, status post left radical nephrectomy several years ago.  He has been very compliant with postoperative surveillance.  He has been found on his last two  series to have an enlarging right adrenal mass that is solid and enhancing, that is quite concerning for metastatic focus.  Options were discussed for management including medical therapy versus surveillance versus metastasectomy with right partial  versus radical adrenalectomy with curative intent. He adamantly wished to proceed with the latter.  Informed consent was obtained and placed in medical record.  PROCEDURE IN DETAIL:  The patient was verified, procedure being right partial adrenalectomy was confirmed.  Procedure timeout was performed.  Intravenous antibiotics were administered.  General endotracheal anesthesia induced.  The patient was placed in  the right side up full flank position, pulling 15 degrees of table flexion, superior arm elevator, axillary roll, sequential compression devices, bottom leg bent, top leg straight.  He was further fastened to operating table using  3-inch tape with foam  padding across supraxiphoid chest and his pelvis.  Foley catheter was placed free to straight drain.  Sterile field was created by prepping and draping his right flank and abdomen using chlorhexidine gluconate after clipper shaving.  A high flow, low  pressure pneumoperitoneum was obtained using Veress technique in the right lower quadrant, having passed the aspiration and drop test.  Next, an 8 mm robotic camera port was placed in position approximately 4 fingerbreadths inferior to the costal margin.   Laparoscopic examination of peritoneal cavity revealed some mild loose omental adhesions in the area of his prior midline extraction site.  Otherwise, unremarkable.  Additional ports were placed as follows:  Right subcostal 8 mm robotic port, right far  lateral 8 mm robotic port approximately 4 fingerbreadths superomedial to the anterior iliac spine, right paramedian inferior robotic port approximately 1 handbreadth superior to the pubic ramus, two 12 mm assistant ports in a slight paramedian location,  one approximately 2 fingerbreadths superior to the planned camera port, one 2 fingerbreadths inferior to the planned camera port and finally a 5 mm subxiphoid port through which a self-locking laparoscopic grasper was used to superiorly retract the  inferior liver edge to allow better access to the area of the adrenal. Robot was docked and passed the electronic checks.  Initial attention was directed at development of retroperitoneum, incision was made lateral to the ascending colon from the area of  the cecum towards the area of hepatic flexure and the colon was carefully swept medially.  Duodenum was encountered and carefully kocherized medially such that it lie medial to the lateral border of the inferior vena cava.  Lower pole of the kidney was  placed on gentle lateral traction.  Dissection proceeded medial to  this.  The gonadal vessels were encountered and identified as they  coursed into the inferior vena cava. Ureter was encountered and carefully swept laterally.  Dissection proceeded within  the plane of the psoas musculature and ureter towards the area of the renal hilum.  Renal hilum consisted of two artery, single vein renal vascular anatomy as anticipated.  This was visualized and mobilized just enough to allow for identification and  clamping should it be necessary.  Kidney was then placed on gentle inferior traction.  Dissection proceeded just lateral to the inferior vena cava superior to the renal vein towards the area of the right adrenal gland.  Right adrenal gland had an  elongated superior, inferior orientation.  This was somewhat favorable actually.  The mass in question was not very obvious due to the small size, but the prospective area was identified grossly, it was felt this would be best corroborated with  intraoperative ultrasound.  Intraoperative ultrasound was used to interrogate the right adrenal gland.  Intraoperative ultrasound did reveal a hypoechoic well-circumscribed lesion in the inferior lateral aspect of the adrenal gland as anticipated.  Using a combination of gross visual and ultrasonic use, it did appear this was amenable to partial  adrenalectomy and to avoid renal insufficiency ideally, given his history of left adrenalectomy previously, very careful medial attachments to the area of adrenal were taken down using bipolar energy, releasing attachments to the inferior vena cava as were  inferior attachments, releasing attachments to the renal vein and the mass and the lateral adrenal leaflet were carefully placed on gentle lateral traction and rotated such that they remained only on a superior pedicle. Superior pedicle was then  controlled using vascular stapler, which resulted in excellent hemostatic control and the partial right adrenalectomy with mass was placed in EndoCatch bag for later retrieval.  It was estimated that approximately  one-third of his in situ adrenal gland  was removed.  I was quite happy with this. Hemostasis appeared acceptable.  Sponge and needle counts were correct. Surgicel followed by FloSeal was applied to the adrenal bed. The liver retractor was taken down to allow pressure tamponade to the partial adrenaletomy bed.  No evidence of  injury was noted from the retractor.  We achieved the goal of extirpated surgery today.  Sponge and needle counts were correct.  Robot was then undocked.  The superior most assistant port site was closed at the level of the fascia using Minette Headland  suture passer and 0 Vicryl.  The specimen was retrieved by extending the inferior most assistant port site for a distance of approximately 2.5 cm, removing the partial adrenalectomy specimen, setting aside for permanent pathology.  This was  reapproximated at the level of fascia using figure-of-eight PDS x2, followed by reapproximation of Scarpa's with running Vicryl.  All incision sites were infiltrated with dilute lipolyzed Marcaine and closed at the level of the skin using subcuticular  Monocryl followed by Dermabond.  Procedure was then terminated.  The patient tolerated the procedure well, no immediate perioperative complications.  The patient was taken to postanesthesia care in stable condition.  Plan for observation admission.  Please note, first assistant Debbrah Alar, was crucial for all portions of the surgery today.  She provided invaluable retraction, suctioning, vascular clipping, vascular stapling, robotic instrument manipulation, and general first assistance.   SHW D: 07/07/2022 4:56:15 pm T: 07/08/2022 1:52:00 am  JOB: [id number]/ [id number]

## 2022-07-08 NOTE — Progress Notes (Signed)
Mobility Specialist - Progress Note   07/08/22 1100  Mobility  Activity Ambulated independently in hallway  Level of Assistance Independent  Assistive Device None  Distance Ambulated (ft) 700 ft  Activity Response Tolerated well  $Mobility charge 1 Mobility   Pt received in bed and agreed to mobilize. No c/o of dizziness or pain just soreness near incisions. Pt left in chair with all needs met.   Roderick Pee Mobility Specialist

## 2022-07-08 NOTE — Discharge Summary (Signed)
[facility] Discharge Summary  Admit date: 07/07/2022  Discharge date and time: 07/08/22   Discharge to: Home  Discharge Service: Urology  Discharge Attending Physician:  Dr. Tresa Moore  Discharge  Diagnoses: Adrenal mass [facility])  Secondary Diagnosis: Principal Problem:   Adrenal mass ([place])   OR Procedures: Procedure(s): XI ROBOTIC PARTIAL ADRENALECTOMY, INTRAOPERATIVE ULTRASOUND 07/07/2022   Ancillary Procedures: None   Discharge Day Services: The patient was seen and examined by the Urology team both in the morning and immediately prior to discharge.  Vital signs and laboratory values were stable and within normal limits.  The physical exam was benign and unchanged and all surgical wounds were examined.  Discharge instructions were explained and all questions answered.  Subjective  No acute events overnight. Pain Controlled. No fever or chills.  Objective Patient Vitals for the past 8 hrs:  BP Temp Temp src Pulse Resp SpO2  07/08/22 1257 106/69 98.7 F (37.1 C) Oral 73 20 90 %   Total I/O In: -  Out: 500 [Urine:500]  General Appearance:        No acute distress Lungs:                       Normal work of breathing on room air Heart:                                Regular rate and rhythm Abdomen:                         Soft, non-tender, non-distended Extremities:                      Warm and well perfused   Hospital Course:  The patient underwent right partial adrenalectomy on 07/07/2022.  The patient tolerated the procedure well, was extubated in the OR, and afterwards was taken to the PACU for routine post-surgical care. When stable the patient was transferred to the floor.   The patient did well postoperatively.  The patient's diet was slowly advanced and at the time of discharge was tolerating a regular diet.  The patient was discharged home 1 Day Post-Op, at which point was tolerating a regular solid diet, was able to void spontaneously, have adequate pain control  with P.O. pain medication, and could ambulate without difficulty. The patient will follow up with [place] for post op check.   Condition at Discharge: Improved  Discharge Medications:  Allergies as of 07/08/2022   No Known Allergies      Medication List     TAKE these medications    docusate sodium 100 MG capsule Commonly known as: COLACE Take 1 capsule (100 mg total) by mouth 2 (two) times daily.   empagliflozin 10 MG Tabs tablet Commonly known as: Jardiance Take 1 tablet (10 mg total) by mouth daily before breakfast.   Entresto 24-26 MG Generic drug: sacubitril-valsartan TAKE 1 TABLET BY MOUTH 2 TIMES DAILY   Humira Pen 40 MG/0.4ML Pnkt Generic drug: Adalimumab Inject 40 mg into the skin every 14 (fourteen) days. Fridays.   HYDROcodone-acetaminophen 5-325 MG tablet Commonly known as: Norco Take 1-2 tablets by mouth every 6 (six) hours as needed for moderate pain or severe pain.   metoprolol succinate 100 MG 24 hr tablet Commonly known as: Toprol XL Take 1 tablet (100 mg total) by mouth daily. Take with or immediately following a meal.  pantoprazole 40 MG tablet Commonly known as: PROTONIX Take 1 tablet (40 mg total) by mouth daily.   spironolactone 25 MG tablet Commonly known as: ALDACTONE Take 0.5 tablets (12.5 mg total) by mouth daily.

## 2022-07-12 LAB — SURGICAL PATHOLOGY

## 2022-07-23 ENCOUNTER — Other Ambulatory Visit (HOSPITAL_COMMUNITY): Payer: 59

## 2022-10-08 ENCOUNTER — Other Ambulatory Visit: Payer: Self-pay | Admitting: Medical

## 2022-10-08 DIAGNOSIS — I5022 Chronic systolic (congestive) heart failure: Secondary | ICD-10-CM

## 2022-10-26 ENCOUNTER — Other Ambulatory Visit: Payer: Self-pay | Admitting: Medical

## 2023-01-31 ENCOUNTER — Other Ambulatory Visit: Payer: Self-pay | Admitting: Medical

## 2023-01-31 ENCOUNTER — Other Ambulatory Visit: Payer: Self-pay | Admitting: Cardiovascular Disease

## 2023-01-31 DIAGNOSIS — I5022 Chronic systolic (congestive) heart failure: Secondary | ICD-10-CM

## 2023-01-31 NOTE — Telephone Encounter (Signed)
Can you please try to schedule an appt - patient is past due for his 3 month follow up.

## 2023-02-01 NOTE — Telephone Encounter (Signed)
Please contact pt for future appointment. Pt overdue for f/u. Pt needing refills. 

## 2023-04-13 NOTE — Telephone Encounter (Signed)
Patient is coming in on 6/13

## 2023-05-05 ENCOUNTER — Encounter: Payer: Self-pay | Admitting: Medical

## 2023-05-05 ENCOUNTER — Ambulatory Visit: Payer: 59 | Attending: Medical | Admitting: Medical

## 2023-05-05 VITALS — BP 140/78 | HR 77 | Ht 74.0 in | Wt 197.0 lb

## 2023-05-05 DIAGNOSIS — I5022 Chronic systolic (congestive) heart failure: Secondary | ICD-10-CM

## 2023-05-05 DIAGNOSIS — E782 Mixed hyperlipidemia: Secondary | ICD-10-CM | POA: Diagnosis not present

## 2023-05-05 DIAGNOSIS — I10 Essential (primary) hypertension: Secondary | ICD-10-CM

## 2023-05-05 DIAGNOSIS — I77810 Thoracic aortic ectasia: Secondary | ICD-10-CM

## 2023-05-05 DIAGNOSIS — Z720 Tobacco use: Secondary | ICD-10-CM

## 2023-05-05 DIAGNOSIS — Z789 Other specified health status: Secondary | ICD-10-CM

## 2023-05-05 DIAGNOSIS — Z79899 Other long term (current) drug therapy: Secondary | ICD-10-CM

## 2023-05-05 DIAGNOSIS — F1090 Alcohol use, unspecified, uncomplicated: Secondary | ICD-10-CM

## 2023-05-05 MED ORDER — METOPROLOL SUCCINATE ER 100 MG PO TB24: ORAL_TABLET | ORAL | 3 refills | Status: DC

## 2023-05-05 MED ORDER — EMPAGLIFLOZIN 10 MG PO TABS: 10.0000 mg | ORAL_TABLET | Freq: Every day | ORAL | 3 refills | Status: DC

## 2023-05-05 MED ORDER — PANTOPRAZOLE SODIUM 40 MG PO TBEC: 40.0000 mg | DELAYED_RELEASE_TABLET | Freq: Every day | ORAL | 3 refills | Status: DC

## 2023-05-05 MED ORDER — ENTRESTO 24-26 MG PO TABS: 1.0000 | ORAL_TABLET | Freq: Two times a day (BID) | ORAL | 3 refills | Status: DC

## 2023-05-05 MED ORDER — SPIRONOLACTONE 25 MG PO TABS: ORAL_TABLET | ORAL | 3 refills | Status: DC

## 2023-05-05 NOTE — Patient Instructions (Signed)
Medication Instructions:  Your Physician recommend you continue on your current medication as directed.     *If you need a refill on your cardiac medications before your next appointment, please call your pharmacy*   Lab Work: Your provider would like for you to have following labs drawn: (CMP, CBC, Mg, TSH, Lipid, direct LDL).   Please go to the [facility] entrance and check in at the front desk.  You do not need an appointment.  They are open from 7am-6 pm.   If you have labs (blood work) drawn today and your tests are completely normal, you will receive your results only by: MyChart Message (if you have MyChart) OR A paper copy in the mail If you have any lab test that is abnormal or we need to change your treatment, we will call you to review the results.   Testing/Procedures: CT Angiography (CTA) chest/aorta, is a special type of CT scan that uses a computer to produce multi-dimensional views of major blood vessels throughout the body. In CT angiography, a contrast material is injected through an IV to help visualize the blood vessels  Nothing to eat or drink 4 hours prior to test  [facility] [address]  [place], [place] [address]    Follow-Up: At [facility], you and your health needs are our priority.  As part of our continuing mission to provide you with exceptional heart care, we have created designated Provider Care Teams.  These Care Teams include your primary Cardiologist (physician) and Advanced Practice Providers (APPs -  Physician Assistants and Nurse Practitioners) who all work together to provide you with the care you need, when you need it.  We recommend signing up for the patient portal called "MyChart".  Sign up information is provided on this After Visit Summary.  MyChart is used to connect with patients for Virtual Visits (Telemedicine).  Patients are able to view lab/test results, encounter notes,  upcoming appointments, etc.  Non-urgent messages can be sent to your provider as well.   To learn more about what you can do with MyChart, go to [email or web address].    Your next appointment:   1 year(s)  Provider:   You may see Lorine Bears, MD or one of the following Advanced Practice Providers on your designated Care Team:   Nicolasa Ducking, NP Eula Listen, PA-C Cadence Fransico Michael, PA-C Charlsie Quest, NP

## 2023-05-05 NOTE — Progress Notes (Signed)
Cardiology Office Note:    Date:  05/05/2023   ID:  Randy Walker, DOB [birth year], MRN [id number]  PCP:  Corwin Levins, MD  [facility] Cardiologist:  Lorine Bears, MD  [facility] Electrophysiologist:  None   Referring MD: Corwin Levins, MD   Chief Complaint: 1 year follow-up  History of Present Illness:    Randy Walker is a 67 y.o. male with a hx of HTN, HLD, HFrEF, NICM, left renal cancer s/p nephrectomy, former smoker, alcohol use, and mild aortic aneurysm who presents for 1 year follow-up.    He was referred to Dr. Kirke Corin following CTA on 08/2021 for monitoring of aortic aneurysm. It also showed a calcium score or 29.    On the initial visit he was noted to have sinus tachycardia and reported mild dyspnea on exertion. He was started on toprol. Echo was ordered which showed LVEF 40-45%, G2DD, moderately dilated ascending aorta at 44mm. He was set up for cardiac cath which showed normal coronaries.    Patient was last seen 03/2022 and was overall doing well. He was on Toprol, [place], [place]. Spironolactone was started. Labs and repeat limited echo were ordered.   Repeat limited echo 05/2022 showed LVEF 40-45%.  Today, the patient reports he is overall doing well. He is needing refills of all his cardiac medications, reports he has been out for 1-2 months. He golfs three times a week. He is smoking 3 cigarettes daily. He reports he may need to go back on antidepressant. Patient is drinking alcohol, 5 drinks daily. Patient denies chest pain. He has occasional shortness of breath when he feels anxious. No lower leg edema, orthopnea or pnd. He needs blood work today.    Past Medical History:  Diagnosis Date   Adenomatous polyps 11/17/2011   Alcohol use 11/17/2011   ALLERGIC RHINITIS 09/10/2007   ANXIETY 09/10/2007   BACK PAIN 12/31/2010   CHF (congestive heart failure) (HCC)    DISC DISEASE, LUMBAR 12/31/2010   GERD (gastroesophageal reflux disease) 11/23/2016    History of kidney cancer    Hyperglycemia 11/23/2016   HYPERLIPIDEMIA 12/31/2010   Hypertension    Personal history of rectal adenomas 01/29/2008   PSORIASIS 12/31/2010   Psoriatic arthritis (HCC)    PULMONARY EMBOLISM, HX OF 09/10/2007   RASH-NONVESICULAR 04/23/2009   Renal mass    SMOKER 12/31/2010    Past Surgical History:  Procedure Laterality Date   LYMPH NODE DISSECTION Bilateral 08/27/2020   Procedure: RETROPERITONEAL LYMPH NODE DISSECTION;  Surgeon: Sebastian Ache, MD;  Location: [facility];  Service: Urology;  Laterality: Bilateral;   RIGHT/LEFT HEART CATH AND CORONARY ANGIOGRAPHY N/A 12/15/2021   Procedure: RIGHT/LEFT HEART CATH AND CORONARY ANGIOGRAPHY;  Surgeon: Iran Ouch, MD;  Location: [facility] INVASIVE CV LAB;  Service: Cardiovascular;  Laterality: N/A;   ROBOT ASSISTED LAPAROSCOPIC NEPHRECTOMY Left 08/27/2020   Procedure: XI ROBOTIC ASSISTED LAPAROSCOPIC RADICAL NEPHRECTOMY;  Surgeon: Sebastian Ache, MD;  Location: [facility];  Service: Urology;  Laterality: Left;  3.5 HRS   ROBOTIC ADRENALECTOMY Right 07/07/2022   Procedure: XI ROBOTIC PARTIAL ADRENALECTOMY, INTRAOPERATIVE ULTRASOUND;  Surgeon: Sebastian Ache, MD;  Location: [facility];  Service: Urology;  Laterality: Right;   ROTATOR CUFF REPAIR  Jan, 2010   [facility], Left   spleen repair     TOTAL HIP ARTHROPLASTY Left 09/09/2017   Procedure: LEFT TOTAL HIP ARTHROPLASTY ANTERIOR APPROACH;  Surgeon: Jodi Geralds, MD;  Location: [facility];  Service: Orthopedics;  Laterality: Left;  WISDOM TOOTH EXTRACTION      Current Medications: Current Meds  Medication Sig   Ibuprofen (ADVIL PO) Take by mouth daily as needed.     Allergies:   Patient has no known allergies.   Social History   Socioeconomic History   Marital status: Married    Spouse name: Not on file   Number of children: Not on file   Years of education: Not on file   Highest education level: Not on file  Occupational History   Occupation: Sports administrator   Tobacco Use   Smoking status: Every Day    Packs/day: 0.25    Years: 40.00    Additional pack years: 0.00    Total pack years: 10.00    Types: Cigarettes   Smokeless tobacco: Never   Tobacco comments:    Restarted in March 2021  Vaping Use   Vaping Use: Never used  Substance and Sexual Activity   Alcohol use: Yes    Alcohol/week: 21.0 standard drinks of alcohol    Types: 21 Standard drinks or equivalent per week    Comment: 3 Bourbon daily   Drug use: No   Sexual activity: Not on file  Other Topics Concern   Not on file  Social History Narrative   Not on file   Social Determinants of Health   Financial Resource Strain: Not on file  Food Insecurity: Not on file  Transportation Needs: Not on file  Physical Activity: Not on file  Stress: Not on file  Social Connections: Not on file     Family History: The patient's family history includes Heart attack in his father. There is no history of Colon cancer, Esophageal cancer, Rectal cancer, or Stomach cancer.  ROS:   Please see the history of present illness.     All other systems reviewed and are negative.  EKGs/Labs/Other Studies Reviewed:    The following studies were reviewed today:  Limited echo 05/2022 1. Left ventricular ejection fraction, by estimation, is 40 to 45%. The  left ventricle has mildly decreased function. The left ventricle  demonstrates global hypokinesis. The average left ventricular global  longitudinal strain is -15.8 %.   2. Right ventricular systolic function is normal. The right ventricular  size is normal.   3. The mitral valve is normal in structure. No evidence of mitral valve  regurgitation. No evidence of mitral stenosis.   4. The aortic valve is normal in structure. Aortic valve regurgitation is  mild. No aortic stenosis is present.   5. The inferior vena cava is normal in size with greater than 50%  respiratory variability, suggesting right atrial pressure of 3 mmHg.    Comparison(s): Heart cath reported LVEF of 45-50% in 11/2021.   Cardiac cath 11/2021 RIGHT/LEFT HEART CATH AND CORONARY ANGIOGRAPHY   Conclusion      There is mild left ventricular systolic dysfunction.   LV end diastolic pressure is normal.   The left ventricular ejection fraction is 45-50% by visual estimate.   1.  Normal coronary arteries. 2.  Mildly reduced LV systolic function with an EF of 45%. 3.  Right heart catheterization showed normal filling pressures, normal pulmonary pressure and normal cardiac output.   Recommendations: The patient has mild nonischemic cardiomyopathy.  Recommend continuing medical therapy.  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  Maximize the dose of Toprol as tolerated.  Echo 10/2021 1. Left ventricular ejection fraction, by estimation, is 40 to 45%. The  left ventricle has mildly decreased function. The left  ventricle  demonstrates global hypokinesis. The left ventricular internal cavity size  was mildly dilated. Left ventricular  diastolic parameters are consistent with Grade II diastolic dysfunction  (pseudonormalization).   2. Right ventricular systolic function is normal. The right ventricular  size is normal.   3. The mitral valve is normal in structure. Mild mitral valve  regurgitation. No evidence of mitral stenosis.   4. The aortic valve is tricuspid. Aortic valve regurgitation is mild to  moderate. Aortic valve sclerosis is present, with no evidence of aortic  valve stenosis.   5. There is mild dilatation of the aortic root, measuring 43 mm. There is  mild dilatation of the ascending aorta, measuring 44 mm.   6. The inferior vena cava is normal in size with greater than 50%  respiratory variability, suggesting right atrial pressure of 3 mmHg.   EKG:  EKG is ordered today.  The ekg ordered today demonstrates NSR 77bpm, LAD, PVC, iLBBB, LAFB  Recent Labs: 06/29/2022: ALT 22 07/07/2022: Platelets 134 07/08/2022: BUN 18;  Creatinine, Ser 1.45; Hemoglobin 13.8; Potassium 4.3; Sodium 137  Recent Lipid Panel    Component Value Date/Time   CHOL 170 10/06/2021 1443   TRIG 127 10/06/2021 1443   HDL 55 10/06/2021 1443   CHOLHDL 3.1 10/06/2021 1443   CHOLHDL 3 10/07/2020 1604   VLDL 54.6 (H) 10/07/2020 1604   LDLCALC 93 10/06/2021 1443   LDLDIRECT 90.0 10/07/2020 1604      Physical Exam:    VS:  BP (!) 140/78 (BP Location: Left Arm, Patient Position: Sitting, Cuff Size: Normal)   Pulse 77   Ht 6\' 2"  (1.88 m)   Wt 197 lb (89.4 kg)   SpO2 98%   BMI 25.29 kg/m     Wt Readings from Last 3 Encounters:  05/05/23 197 lb (89.4 kg)  07/07/22 191 lb 6.4 oz (86.8 kg)  06/29/22 191 lb 6.4 oz (86.8 kg)     GEN:  Well nourished, well developed in no acute distress HEENT: Normal NECK: No JVD; No carotid bruits LYMPHATICS: No lymphadenopathy CARDIAC: RRR, no murmurs, rubs, gallops RESPIRATORY:  Clear to auscultation without rales, wheezing or rhonchi  ABDOMEN: Soft, non-tender, non-distended MUSCULOSKELETAL:  No edema; No deformity  SKIN: Warm and dry NEUROLOGIC:  Alert and oriented x 3 PSYCHIATRIC:  Normal affect   ASSESSMENT:    1. Ascending aorta dilatation (HCC)   2. Chronic systolic heart failure (HCC)   3. Hyperlipidemia, mixed   4. Essential hypertension   5. Alcohol use   6. Tobacco use    PLAN:    In order of problems listed above:  HFrEF NICM Repeat limited echo 05/2022 showed unchanged LVEF 40-45%. He is euvolemic on exam today. Refill Toprol 100mg  daily, Entresto 24-26mg BID, Spironolactone 25mg  daily and Jardiance 10mg  daily. Lifestyle changes discussed. I will check general labs: CMET, TSH, Mag, CBC  Dilated ascending aorta Cardiac CTA in 2022 showed mild dilation of the ascending aorta 43cm. I will check a CTA Aorta.    HLD Check lipid panel today. LDL 93 in 2022.  HTN Bp is mildly elevated, but he has been out of cardiac medications. Refill Toprol 100mg  daily, Entresto  24-26mg BID, Spironolactone 25mg  daily and Jardiance 10mg  daily.   Sinus tachycardia EKG shows NSR with heart rate of 77bpm.  Alcohol use He is drinking 5 alcohol beverages daily, cessation encouraged.   Tobacco use He is down to 3 cigarettes daily, complete cessation encouraged.   Disposition: Follow up in 1 year(s)  with MD/APP     Signed, Akirah Storck David Stall, PA-C  05/05/2023 3:01 PM    [facility]

## 2023-05-18 ENCOUNTER — Ambulatory Visit
Admission: RE | Admit: 2023-05-18 | Discharge: 2023-05-18 | Disposition: A | Discharge status: Home / Self Care | Payer: 59 | Source: Ambulatory Visit | Attending: Medical | Admitting: Medical

## 2023-05-18 DIAGNOSIS — I77810 Thoracic aortic ectasia: Secondary | ICD-10-CM | POA: Diagnosis present

## 2023-05-18 LAB — POCT I-STAT CREATININE: Creatinine, Ser: 1.3 mg/dL — ABNORMAL HIGH (ref 0.61–1.24)

## 2023-05-18 MED ORDER — IOHEXOL 350 MG/ML SOLN
75.0000 mL | Freq: Once | INTRAVENOUS | Status: AC | PRN
  Administered 2023-05-18: 75 mL via INTRAVENOUS

## 2023-05-19 NOTE — Addendum Note (Signed)
Addended by: Nishanth Mccaughan N on: 05/19/2023 02:58 PM   Modules accepted: Orders  

## 2023-05-19 NOTE — Addendum Note (Signed)
Addended by: Denina Rieger N on: 05/19/2023 02:58 PM   Modules accepted: Orders  

## 2023-05-19 NOTE — Addendum Note (Signed)
Addended by: Parke Poisson on: 05/19/2023 02:58 PM   Modules accepted: Orders

## 2023-05-19 NOTE — Addendum Note (Signed)
Addended by: Vivek Grealish N on: 05/19/2023 02:58 PM   Modules accepted: Orders  

## 2023-05-19 NOTE — Addendum Note (Signed)
Addended by: Annslee Tercero N on: 05/19/2023 02:58 PM   Modules accepted: Orders  

## 2023-05-19 NOTE — Addendum Note (Signed)
Addended by: Emin Foree N on: 05/19/2023 02:58 PM   Modules accepted: Orders  

## 2023-10-14 ENCOUNTER — Ambulatory Visit: Payer: Self-pay

## 2024-02-29 ENCOUNTER — Emergency Department

## 2024-02-29 ENCOUNTER — Inpatient Hospital Stay
Admission: EM | Admit: 2024-02-29 | Discharge: 2024-03-02 | DRG: 643 | Disposition: A | Discharge status: Home / Self Care | Attending: Internal Medicine | Admitting: Internal Medicine

## 2024-02-29 ENCOUNTER — Other Ambulatory Visit: Payer: Self-pay

## 2024-02-29 ENCOUNTER — Observation Stay

## 2024-02-29 ENCOUNTER — Inpatient Hospital Stay
Admission: RE | Admit: 2024-02-29 | Discharge: 2024-02-29 | Disposition: A | Discharge status: Home / Self Care | Source: Ambulatory Visit | Attending: Internal Medicine | Admitting: Internal Medicine

## 2024-02-29 DIAGNOSIS — R5381 Other malaise: Secondary | ICD-10-CM | POA: Diagnosis present

## 2024-02-29 DIAGNOSIS — I5020 Unspecified systolic (congestive) heart failure: Secondary | ICD-10-CM | POA: Diagnosis not present

## 2024-02-29 DIAGNOSIS — Z85528 Personal history of other malignant neoplasm of kidney: Secondary | ICD-10-CM

## 2024-02-29 DIAGNOSIS — Z96642 Presence of left artificial hip joint: Secondary | ICD-10-CM | POA: Diagnosis present

## 2024-02-29 DIAGNOSIS — F17210 Nicotine dependence, cigarettes, uncomplicated: Secondary | ICD-10-CM | POA: Diagnosis present

## 2024-02-29 DIAGNOSIS — Z79899 Other long term (current) drug therapy: Secondary | ICD-10-CM

## 2024-02-29 DIAGNOSIS — F1090 Alcohol use, unspecified, uncomplicated: Secondary | ICD-10-CM | POA: Diagnosis present

## 2024-02-29 DIAGNOSIS — L4050 Arthropathic psoriasis, unspecified: Secondary | ICD-10-CM | POA: Diagnosis present

## 2024-02-29 DIAGNOSIS — Z860101 Personal history of adenomatous and serrated colon polyps: Secondary | ICD-10-CM

## 2024-02-29 DIAGNOSIS — E785 Hyperlipidemia, unspecified: Secondary | ICD-10-CM | POA: Diagnosis present

## 2024-02-29 DIAGNOSIS — E271 Primary adrenocortical insufficiency: Secondary | ICD-10-CM | POA: Diagnosis not present

## 2024-02-29 DIAGNOSIS — I959 Hypotension, unspecified: Secondary | ICD-10-CM | POA: Diagnosis present

## 2024-02-29 DIAGNOSIS — I5022 Chronic systolic (congestive) heart failure: Secondary | ICD-10-CM | POA: Diagnosis present

## 2024-02-29 DIAGNOSIS — G9340 Encephalopathy, unspecified: Secondary | ICD-10-CM | POA: Diagnosis not present

## 2024-02-29 DIAGNOSIS — Z9889 Other specified postprocedural states: Secondary | ICD-10-CM

## 2024-02-29 DIAGNOSIS — R4781 Slurred speech: Secondary | ICD-10-CM | POA: Diagnosis present

## 2024-02-29 DIAGNOSIS — K219 Gastro-esophageal reflux disease without esophagitis: Secondary | ICD-10-CM | POA: Diagnosis present

## 2024-02-29 DIAGNOSIS — Z905 Acquired absence of kidney: Secondary | ICD-10-CM

## 2024-02-29 DIAGNOSIS — R112 Nausea with vomiting, unspecified: Secondary | ICD-10-CM

## 2024-02-29 DIAGNOSIS — E871 Hypo-osmolality and hyponatremia: Secondary | ICD-10-CM | POA: Diagnosis not present

## 2024-02-29 DIAGNOSIS — I10 Essential (primary) hypertension: Secondary | ICD-10-CM | POA: Diagnosis present

## 2024-02-29 DIAGNOSIS — Z7984 Long term (current) use of oral hypoglycemic drugs: Secondary | ICD-10-CM

## 2024-02-29 DIAGNOSIS — F1020 Alcohol dependence, uncomplicated: Secondary | ICD-10-CM | POA: Diagnosis present

## 2024-02-29 DIAGNOSIS — Z8249 Family history of ischemic heart disease and other diseases of the circulatory system: Secondary | ICD-10-CM

## 2024-02-29 DIAGNOSIS — R531 Weakness: Secondary | ICD-10-CM

## 2024-02-29 DIAGNOSIS — I110 Hypertensive heart disease with heart failure: Secondary | ICD-10-CM | POA: Diagnosis present

## 2024-02-29 DIAGNOSIS — E896 Postprocedural adrenocortical (-medullary) hypofunction: Secondary | ICD-10-CM | POA: Diagnosis not present

## 2024-02-29 DIAGNOSIS — F419 Anxiety disorder, unspecified: Secondary | ICD-10-CM | POA: Diagnosis present

## 2024-02-29 DIAGNOSIS — Z86711 Personal history of pulmonary embolism: Secondary | ICD-10-CM

## 2024-02-29 DIAGNOSIS — E278 Other specified disorders of adrenal gland: Secondary | ICD-10-CM | POA: Diagnosis present

## 2024-02-29 DIAGNOSIS — Z7952 Long term (current) use of systemic steroids: Secondary | ICD-10-CM

## 2024-02-29 DIAGNOSIS — G9341 Metabolic encephalopathy: Secondary | ICD-10-CM | POA: Diagnosis present

## 2024-02-29 LAB — BASIC METABOLIC PANEL WITH GFR
Anion gap: 9 (ref 5–15)
BUN: 16 mg/dL (ref 8–23)
CO2: 19 mmol/L — ABNORMAL LOW (ref 22–32)
Calcium: 8.7 mg/dL — ABNORMAL LOW (ref 8.9–10.3)
Chloride: 79 mmol/L — ABNORMAL LOW (ref 98–111)
Creatinine, Ser: 1.16 mg/dL (ref 0.61–1.24)
GFR, Estimated: >60 mL/min (ref >60)
Glucose, Bld: 113 mg/dL — ABNORMAL HIGH (ref 70–99)
Potassium: 4.5 mmol/L (ref 3.5–5.1)
Sodium: 107 mmol/L — CL (ref 135–145)

## 2024-02-29 LAB — URINALYSIS, COMPLETE (UACMP) WITH MICROSCOPIC
Bacteria, UA: NONE SEEN
Bilirubin Urine: NEGATIVE
Glucose, UA: >=500 mg/dL — AB
Hgb urine dipstick: NEGATIVE
Ketones, ur: 20 mg/dL — AB
Leukocytes,Ua: NEGATIVE
Nitrite: NEGATIVE
Protein, ur: NEGATIVE mg/dL
RBC / HPF: 0 RBC/hpf (ref 0–5)
Specific Gravity, Urine: 1.023 (ref 1.005–1.030)
pH: 5 (ref 5.0–8.0)

## 2024-02-29 LAB — CBC
Hemoglobin: 15.6 g/dL (ref 13.0–17.0)
Platelets: 144 10*3/uL — ABNORMAL LOW (ref 150–400)
WBC: 5.5 10*3/uL (ref 4.0–10.5)

## 2024-02-29 LAB — SODIUM
Sodium: 109 mmol/L — CL (ref 135–145)
Sodium: 112 mmol/L — CL (ref 135–145)
Sodium: 110 mmol/L — CL (ref 135–145)
Sodium: 111 mmol/L — CL (ref 135–145)

## 2024-02-29 LAB — HIV ANTIBODY (ROUTINE TESTING W REFLEX): HIV Screen 4th Generation wRfx: NONREACTIVE

## 2024-02-29 LAB — CORTISOL: Cortisol, Plasma: 10.7 ug/dL

## 2024-02-29 LAB — OSMOLALITY, URINE: Osmolality, Ur: 388 mosm/kg (ref 300–900)

## 2024-02-29 LAB — SODIUM, URINE, RANDOM: Sodium, Ur: 26 mmol/L

## 2024-02-29 LAB — CREATININE, URINE, RANDOM: Creatinine, Urine: 73 mg/dL

## 2024-02-29 LAB — OSMOLALITY: Osmolality: 241 mosm/kg — CL (ref 275–295)

## 2024-02-29 MED ORDER — ONDANSETRON HCL 4 MG/2ML IJ SOLN: 4.0000 mg | Freq: Four times a day (QID) | INTRAMUSCULAR | Status: DC | PRN

## 2024-02-29 MED ORDER — ENOXAPARIN SODIUM 40 MG/0.4ML IJ SOSY
40.0000 mg | PREFILLED_SYRINGE | INTRAMUSCULAR | Status: DC
  Administered 2024-02-29 – 2024-03-01 (×2): 40 mg via SUBCUTANEOUS
  Filled 2024-02-29 (×3): qty 0.4

## 2024-02-29 MED ORDER — ONDANSETRON HCL 4 MG PO TABS: 4.0000 mg | ORAL_TABLET | Freq: Four times a day (QID) | ORAL | Status: DC | PRN

## 2024-02-29 MED ORDER — SODIUM CHLORIDE 0.9 % IV BOLUS
500.0000 mL | Freq: Once | INTRAVENOUS | Status: AC
  Administered 2024-02-29: 500 mL via INTRAVENOUS

## 2024-02-29 MED ORDER — SODIUM CHLORIDE 3 % IV SOLN
INTRAVENOUS | Status: DC
  Filled 2024-02-29 (×2): qty 500

## 2024-02-29 MED ORDER — ADULT MULTIVITAMIN W/MINERALS CH
1.0000 | ORAL_TABLET | Freq: Every day | ORAL | Status: DC
  Administered 2024-02-29 – 2024-03-02 (×3): 1 via ORAL
  Filled 2024-02-29 (×3): qty 1

## 2024-02-29 MED ORDER — FOLIC ACID 1 MG PO TABS
1.0000 mg | ORAL_TABLET | Freq: Every day | ORAL | Status: DC
  Administered 2024-02-29 – 2024-03-02 (×3): 1 mg via ORAL
  Filled 2024-02-29 (×3): qty 1

## 2024-02-29 MED ORDER — THIAMINE MONONITRATE 100 MG PO TABS
100.0000 mg | ORAL_TABLET | Freq: Every day | ORAL | Status: DC
  Administered 2024-02-29 – 2024-03-02 (×3): 100 mg via ORAL
  Filled 2024-02-29 (×3): qty 1

## 2024-02-29 MED ORDER — ONDANSETRON 4 MG PO TBDP
4.0000 mg | ORAL_TABLET | Freq: Three times a day (TID) | ORAL | 0 refills | Status: AC | PRN
Start: 2024-02-29 — End: ?

## 2024-02-29 MED ORDER — IOHEXOL 300 MG/ML  SOLN
100.0000 mL | Freq: Once | INTRAMUSCULAR | Status: AC | PRN
  Administered 2024-02-29: 100 mL via INTRAVENOUS

## 2024-02-29 MED ORDER — THIAMINE HCL 100 MG/ML IJ SOLN
100.0000 mg | Freq: Every day | INTRAMUSCULAR | Status: DC
  Filled 2024-02-29 (×2): qty 1

## 2024-02-29 MED ORDER — LORAZEPAM 1 MG PO TABS
1.0000 mg | ORAL_TABLET | ORAL | Status: DC | PRN
  Administered 2024-02-29: 1 mg via ORAL
  Administered 2024-03-02 (×2): 2 mg via ORAL
  Filled 2024-02-29: qty 2
  Filled 2024-02-29: qty 1
  Filled 2024-02-29: qty 2

## 2024-02-29 MED ORDER — LORAZEPAM 2 MG/ML IJ SOLN: 1.0000 mg | INTRAMUSCULAR | Status: DC | PRN

## 2024-02-29 NOTE — ED Notes (Signed)
 Lab calls to say pt Na+ 110 Dr Larinda Buttery notified by writer RN

## 2024-02-29 NOTE — ED Notes (Addendum)
 Critical Result: Na 107  Mian, MD made aware

## 2024-02-29 NOTE — TOC Initial Note (Signed)
 Transition of Care [facility]) - Initial/Assessment Note    Patient Details  Name: Randy Walker MRN: [id number] Date of Birth: [birth year]  Transition of Care [facility]) CM/SW Contact:    Colin Broach, LCSW Phone Number: 02/29/2024, 6:08 PM  Clinical Narrative: CSW completed assessment with patient.  CSW introduced self and reason for call.  Patient amenable to answering questions.  Patient's PCP is Dr. Jonny Ruiz and he uses [facility].  He has no concerns with getting his medications.  Patient has no DME usage at home.  He lives with his wife, Randy Walker and four dogs.  CSW discussed consult for SUD resources with patient.  He declined any SUD resources.  At discharge, patient's wife, Randy Walker, will transport him home.                  Expected Discharge Plan: Home/Self Care Barriers to Discharge: Continued Medical Work up   Patient Goals and CMS Choice            Expected Discharge Plan and Services       Living arrangements for the past 2 months: Single Family Home                                      Prior Living Arrangements/Services Living arrangements for the past 2 months: Single Family Home Lives with:: Spouse Patient language and need for interpreter reviewed:: Yes Do you feel safe going back to the place where you live?: Yes      Need for Family Participation in Patient Care: No (Comment) Care giver support system in place?: No (comment)   Criminal Activity/Legal Involvement Pertinent to Current Situation/Hospitalization: No - Comment as needed  Activities of Daily Living      Permission Sought/Granted                  Emotional Assessment Appearance:: Appears stated age Attitude/Demeanor/Rapport: Engaged Affect (typically observed): Appropriate Orientation: : Oriented to Self, Oriented to Place, Oriented to  Time, Oriented to Situation Alcohol / Substance Use: Alcohol Use (patient declined SUD resources) Psych Involvement: No (comment)  Admission  diagnosis:  Hyponatremia [E87.1] Patient Active Problem List   Diagnosis Date Noted   Hyponatremia 02/29/2024   Encephalopathy 02/29/2024   Adrenal mass (HCC) 07/07/2022   Chronic HFrEF (heart failure with reduced ejection fraction) (HCC)    Renal mass 08/27/2020   Abnormal LFTs 01/14/2020   Right lower quadrant abdominal pain 01/14/2020   Low back pain 07/12/2018   Complication of Foley catheter (HCC) 01/10/2018   Acute blood loss anemia 01/10/2018   Primary osteoarthritis of left hip 09/09/2017   Hypertension    Arthritis of left hip 12/10/2016   Chest pain 11/28/2016   GERD (gastroesophageal reflux disease) 11/23/2016   Hyperglycemia 11/23/2016   Psoriatic arthritis (HCC) 09/30/2015   Chronic low back pain 09/26/2014   Encounter for well adult exam with abnormal findings 11/17/2011   Alcohol use 11/17/2011   HLD (hyperlipidemia) 12/31/2010   SMOKER 12/31/2010   Psoriasis 12/31/2010   DISC DISEASE, LUMBAR 12/31/2010   Backache 12/31/2010   History of colonic polyps 01/29/2008   Anxiety state 09/10/2007   Allergic rhinitis 09/10/2007   INSOMNIA 09/10/2007   PULMONARY EMBOLISM, HX OF 09/10/2007   PCP:  Corwin Levins, MD Pharmacy:   [facility] - [place], [place] - [address] [place] [address] Phone: [phone] Fax:  [phone]  [facility] [address] [place], [place] - [address] [facility] [address] [facility] [address] Phone: [phone] Fax: [phone]  [facility] [address] [place] [address] Phone: [phone] Fax: [phone]  [facility] [address] [place] [address] Phone: [phone] Fax: [phone]     Social Drivers of Health (SDOH) Social History: SDOH Screenings   Depression (PHQ2-9): Low Risk  (05/28/2020)  Tobacco Use: High Risk (05/05/2023)   SDOH Interventions:      Readmission Risk Interventions     No data to display

## 2024-02-29 NOTE — Progress Notes (Signed)
 PHARMACY CONSULT NOTE - Hypertonic Saline  Pharmacy Consult for Hypertonic Saline Monitoring and Management  Recent Labs: Potassium (mmol/L)  Date Value  02/29/2024 4.5   Calcium (mg/dL)  Date Value  40/98/1191 8.7 (L)   Albumin (g/dL)  Date Value  47/82/9562 3.9  10/06/2021 4.4   Sodium (mmol/L)  Date Value  02/29/2024 107 (LL)  05/26/2022 133 (L)    Assessment  Randy Walker is a 68 y.o. male presenting with SOB and weakness. 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 Pharmacy has been consulted to monitor hypertonic saline (3%) infusion.  Pertinent medications: None  Baseline Labs: Serum Na 107, Serum Osm -, Urine Na -, Urine Osm -  Goal of Therapy:  Increase in Na by 4-6 mEq/L in 4-6 hours Do not exceed increase in Na by 10-12 mEq/L in 24 hours  Monitoring:  Date Time Na Rate/Comment    Plan:  Initiate 3% saline at 30 mL/hr Check Na Q2H x2 then Q4H  Stop infusion if  Na increases > 4 mEq/L in first 2 hours Na increases > 6 mEq/L in first 4 hours Na increases > 6 mEq/L in 6 hours  Na increases > 8 mEq/L in 8 hours (give D5W bolus) Continue to monitor for signs of clinical improvement and recommendations per nephrology  Merryl Hacker PharmD Clinical Pharmacist 02/29/24 1:37 PM

## 2024-02-29 NOTE — Assessment & Plan Note (Addendum)
 2D echo July 2023 with a EF of 40 to 45% Clinically dry in the setting of severe hyponatremia Gentle IV fluid hydration with noted hypertonic saline per nephrology recommendations Monitor volume status closely Hold spironolactone in setting of hyponatremia Follow

## 2024-02-29 NOTE — Assessment & Plan Note (Signed)
 PPI ?

## 2024-02-29 NOTE — Assessment & Plan Note (Signed)
 Mild encephalopathy in presentation in setting of severe hyponatremia Minimal confusion at present Grossly nonfocal neuroexam CT head within normal limits Will gently correct hyponatremia per nephrology recommendations Check ammonia level No overt infection noted Otherwise monitor closely

## 2024-02-29 NOTE — Progress Notes (Signed)
 PHARMACY CONSULT NOTE - Hypertonic Saline  Pharmacy Consult for Hypertonic Saline Monitoring and Management  Recent Labs: Potassium (mmol/L)  Date Value  02/29/2024 4.5   Calcium (mg/dL)  Date Value  16/08/9603 8.7 (L)   Albumin (g/dL)  Date Value  54/07/8118 3.9  10/06/2021 4.4   Sodium (mmol/L)  Date Value  02/29/2024 112 (LL)  05/26/2022 133 (L)    Assessment  Randy Walker is a 68 y.o. male presenting with SOB and weakness. 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 Pharmacy has been consulted to monitor hypertonic saline (3%) infusion.  Pertinent medications: None  Baseline Labs: Serum Na 107, Serum Osm -, Urine Na -, Urine Osm -  Goal of Therapy:  Increase in Na by 4-6 mEq/L in 4-6 hours Do not exceed increase in Na by 10-12 mEq/L in 24 hours  Monitoring:  Date Time Na Rate/Comment  4/9 1205 107 Baseline 4/9 1409 - Na 3% Infusion started at 30 mL/hr 4/9 1509 109 Increased 2 mEq/L in ~2 hours 4/9 1825 112 Increased 5 mEq/L in ~6 hours  Plan:  Initiate 3% saline at 30 mL/hr Check Na Q2H x2 then Q4H  Stop infusion if  Na increases > 4 mEq/L in first 2 hours Na increases > 6 mEq/L in first 4 hours Na increases > 6 mEq/L in 6 hours  Na increases > 8 mEq/L in 8 hours (give D5W bolus) Continue to monitor for signs of clinical improvement and recommendations per nephrology  Rockwell Alexandria PharmD Clinical Pharmacist 02/29/24 7:45 PM

## 2024-02-29 NOTE — Assessment & Plan Note (Addendum)
 Severe hyponatremia with sodium of 107 on presentation Suspect likely multifactorial with contributions of dehydration/decreased p.o. intake, offending medications including spironolactone, alcohol abuse Clinically dry with fairly stable mentation Nephrology consulted Started on hypertonic saline in the ER per nephrology recommendations Follow-up formal recommendations Every 2 hours sodium level Goal 6-8 mill equivalent change over the next 12 to 24 hours Check urine and serum studies We will also add on cortisol level in setting history of partial adrenalectomy and unilateral kidney Monitor

## 2024-02-29 NOTE — ED Notes (Signed)
 Lab called reporting critical sodium of 111 for pt. Manuela Schwartz, NP notified.

## 2024-02-29 NOTE — Assessment & Plan Note (Signed)
 Patient drinks roughly 1 bottle of wine daily CIWA protocol Monitor

## 2024-02-29 NOTE — Assessment & Plan Note (Signed)
 Noted history of adrenal mass status post partial adrenalectomy August 2023 Monitor

## 2024-02-29 NOTE — Assessment & Plan Note (Signed)
 BP stable Titrate home regimen

## 2024-02-29 NOTE — H&P (Signed)
 History and Physical    Patient: Randy Walker VHQ:[id number] DOB: 01-27-1956 DOA: 02/29/2024 DOS: the patient was seen and examined on 02/29/2024 PCP: Corwin Levins, MD  Patient coming from: Home  Chief Complaint:  Chief Complaint  Patient presents with   Shortness of Breath   Weakness   HPI: Randy Walker is a 68 y.o. male with medical history significant of alcohol abuse, HFrEF, GERD, history of kidney cancer status post nephrectomy, history of renal mass status post partial adrenalectomy, psoriasis, tobacco abuse presenting with encephalopathy, severe hyponatremia.  Patient reports roughly 3 days of recurrent nausea and vomiting.  Minimal abdominal pain.  Minimal diarrhea.  Positive mild shortness of breath.  Smoking 1/4 to 1/2 pack/day.  No fevers or chills.  No reported sick contacts.  Drinks roughly 1 bottle of wine daily.  No reported sick contacts.  On spironolactone in setting of chronic HFrEF.  Reports very minimal fluid and solid intake over the past 3 to 4 days.  Has had worsening malaise and weakness.  No focal hemiparesis. No reported slurred speech.  Mild confusion. Presented to the ER afebrile, hemodynamically stable.  Satting well on room air.  White count 5.5, hemoglobin 15.6, platelets 144, creatinine 1.16, sodium 107.  Chest x-ray, CT head grossly stable.  CT abdomen pelvis pending. Review of Systems: As mentioned in the history of present illness. All other systems reviewed and are negative. Past Medical History:  Diagnosis Date   Adenomatous polyps 11/17/2011   Alcohol use 11/17/2011   ALLERGIC RHINITIS 09/10/2007   ANXIETY 09/10/2007   BACK PAIN 12/31/2010   CHF (congestive heart failure) (HCC)    DISC DISEASE, LUMBAR 12/31/2010   GERD (gastroesophageal reflux disease) 11/23/2016   History of kidney cancer    Hyperglycemia 11/23/2016   HYPERLIPIDEMIA 12/31/2010   Hypertension    Personal history of rectal adenomas 01/29/2008   PSORIASIS 12/31/2010   Psoriatic  arthritis (HCC)    PULMONARY EMBOLISM, HX OF 09/10/2007   RASH-NONVESICULAR 04/23/2009   Renal mass    SMOKER 12/31/2010   Past Surgical History:  Procedure Laterality Date   LYMPH NODE DISSECTION Bilateral 08/27/2020   Procedure: RETROPERITONEAL LYMPH NODE DISSECTION;  Surgeon: Sebastian Ache, MD;  Location: [facility];  Service: Urology;  Laterality: Bilateral;   RIGHT/LEFT HEART CATH AND CORONARY ANGIOGRAPHY N/A 12/15/2021   Procedure: RIGHT/LEFT HEART CATH AND CORONARY ANGIOGRAPHY;  Surgeon: Iran Ouch, MD;  Location: [facility];  Service: Cardiovascular;  Laterality: N/A;   ROBOT ASSISTED LAPAROSCOPIC NEPHRECTOMY Left 08/27/2020   Procedure: XI ROBOTIC ASSISTED LAPAROSCOPIC RADICAL NEPHRECTOMY;  Surgeon: Sebastian Ache, MD;  Location: [facility];  Service: Urology;  Laterality: Left;  3.5 HRS   ROBOTIC ADRENALECTOMY Right 07/07/2022   Procedure: XI ROBOTIC PARTIAL ADRENALECTOMY, INTRAOPERATIVE ULTRASOUND;  Surgeon: Sebastian Ache, MD;  Location: [facility];  Service: Urology;  Laterality: Right;   ROTATOR CUFF REPAIR  Jan, 2010   [facility], Left   spleen repair     TOTAL HIP ARTHROPLASTY Left 09/09/2017   Procedure: LEFT TOTAL HIP ARTHROPLASTY ANTERIOR APPROACH;  Surgeon: Jodi Geralds, MD;  Location: [facility];  Service: Orthopedics;  Laterality: Left;   WISDOM TOOTH EXTRACTION     Social History:  reports that he has been smoking cigarettes. He has a 10 pack-year smoking history. He has never used smokeless tobacco. He reports current alcohol use of about 21.0 standard drinks of alcohol per week. He reports that he does not use drugs.  No Known  Allergies  Family History  Problem Relation Age of Onset   Heart attack Father    Colon cancer Neg Hx    Esophageal cancer Neg Hx    Rectal cancer Neg Hx    Stomach cancer Neg Hx     Prior to Admission medications   Medication Sig Start Date End Date Taking? Authorizing Provider  empagliflozin (JARDIANCE) 10 MG TABS tablet Take 1  tablet (10 mg total) by mouth daily before breakfast. 05/05/23  Yes Furth, Cadence H, PA-C  FLUoxetine (PROZAC) 20 MG capsule Take 20 mg by mouth daily. 01/13/24  Yes [provider]  Ibuprofen (ADVIL PO) Take by mouth daily as needed.   Yes [provider]  metoprolol succinate (TOPROL-XL) 100 MG 24 hr tablet TAKE 1 TABLET BY MOUTH ONCE A DAY. TAKE WITH OR IMMEDIATELY FOLLOWING A MEAL 05/05/23  Yes Furth, Cadence H, PA-C  ondansetron (ZOFRAN-ODT) 4 MG disintegrating tablet Take 1 tablet (4 mg total) by mouth every 8 (eight) hours as needed for nausea or vomiting. 02/29/24  Yes Marinell Blight, MD  pantoprazole (PROTONIX) 40 MG tablet Take 1 tablet (40 mg total) by mouth daily. 05/05/23  Yes Furth, Cadence H, PA-C  Risankizumab-rzaa (SKYRIZI PEN [place]) Inject 180 mg into the skin every 3 (three) months. 01/18/24  Yes [provider]  sacubitril-valsartan (ENTRESTO) 24-26 MG Take 1 tablet by mouth 2 (two) times daily. 05/05/23  Yes Furth, Cadence H, PA-C  spironolactone (ALDACTONE) 25 MG tablet TAKE ONE HALF TABLET (12.5MG ) BY MOUTH DAILY 05/05/23  Yes Furth, Cadence H, PA-C  docusate sodium (COLACE) 100 MG capsule Take 1 capsule (100 mg total) by mouth 2 (two) times daily. Patient not taking: Reported on 05/05/2023 07/07/22   Harrie Foreman, PA-C  HUMIRA PEN 40 MG/0.4ML PNKT Inject 40 mg into the skin every 14 (fourteen) days. Fridays. Patient not taking: Reported on 05/05/2023 12/02/21   [provider]    Physical Exam: Vitals:   02/29/24 1205 02/29/24 1245 02/29/24 1400 02/29/24 1500  BP:  (!) 125/93 113/72 128/86  Pulse:  63 (!) 59 (!) 58  Resp:  14 20 18   Temp:      TempSrc:      SpO2:  100% 100% 100%  Weight: 89.4 kg     Height: 6\' 2"  (1.88 m)      Physical Exam Constitutional:      Appearance: He is normal weight.  HENT:     Head: Normocephalic and atraumatic.     Nose: Nose normal.     Mouth/Throat:     Mouth: Mucous membranes are dry.  Eyes:     Pupils:  Pupils are equal, round, and reactive to light.  Cardiovascular:     Rate and Rhythm: Normal rate and regular rhythm.  Pulmonary:     Effort: Pulmonary effort is normal.  Abdominal:     General: Bowel sounds are normal.  Musculoskeletal:        General: Normal range of motion.  Skin:    General: Skin is dry.  Neurological:     General: No focal deficit present.  Psychiatric:        Mood and Affect: Mood normal.     Data Reviewed:  There are no new results to review at this time.  CT HEAD WO CONTRAST CLINICAL DATA:  Neurologic deficit.  EXAM: CT HEAD WITHOUT CONTRAST  TECHNIQUE: Contiguous axial images were obtained from the base of the skull through the vertex without intravenous contrast.  RADIATION DOSE REDUCTION: This exam was performed according to the departmental dose-optimization program which includes automated exposure control, adjustment of the mA and/or kV according to patient size and/or use of iterative reconstruction technique.  COMPARISON:  None Available.  FINDINGS: Brain: The ventricles and sulci are appropriate size for the patient's age. The gray-white matter discrimination is preserved. There is no acute intracranial hemorrhage. No mass effect or midline shift. No extra-axial fluid collection.  Vascular: No hyperdense vessel or unexpected calcification.  Skull: Normal. Negative for fracture or focal lesion.  Sinuses/Orbits: No acute finding.  Other: None  IMPRESSION: No acute intracranial pathology.  Electronically Signed   By: Elgie Collard M.D.   On: 02/29/2024 14:52 DG Chest 2 View CLINICAL DATA:  Shortness of breath and weakness for the past 2 days. Slurred speech and dizziness. Nausea.  EXAM: CHEST - 2 VIEW  COMPARISON:  06/19/2020  FINDINGS: Normal sized heart. Tortuous aorta. Clear lungs with normal vascularity. Midthoracic spine degenerative changes.  IMPRESSION: No acute abnormality.  Electronically Signed    By: Beckie Salts M.D.   On: 02/29/2024 14:22  Lab Results  Component Value Date   WBC 5.5 02/29/2024   HGB 15.6 02/29/2024   HCT RESULTS UNAVAILABLE DUE TO INTERFERING SUBSTANCE 02/29/2024   MCV RESULTS UNAVAILABLE DUE TO INTERFERING SUBSTANCE 02/29/2024   PLT 144 (L) 02/29/2024   Last metabolic panel Lab Results  Component Value Date   GLUCOSE 113 (H) 02/29/2024   NA 107 (LL) 02/29/2024   K 4.5 02/29/2024   CL 79 (L) 02/29/2024   CO2 19 (L) 02/29/2024   BUN 16 02/29/2024   CREATININE 1.16 02/29/2024   GFRNONAA >60 02/29/2024   CALCIUM 8.7 (L) 02/29/2024   PROT 7.3 06/29/2022   ALBUMIN 3.9 06/29/2022   LABGLOB 2.7 10/06/2021   AGRATIO 1.6 10/06/2021   BILITOT 0.5 06/29/2022   ALKPHOS 72 06/29/2022   AST 25 06/29/2022   ALT 22 06/29/2022   ANIONGAP 9 02/29/2024    Assessment and Plan: * Hyponatremia Severe hyponatremia with sodium of 107 on presentation Suspect likely multifactorial with contributions of dehydration/decreased p.o. intake, offending medications including spironolactone, alcohol abuse Clinically dry with fairly stable mentation Nephrology consulted Started on hypertonic saline in the ER per nephrology recommendations Follow-up formal recommendations Every 2 hours sodium level Goal 6-8 mill equivalent change over the next 12 to 24 hours Check urine and serum studies We will also add on cortisol level in setting history of partial adrenalectomy and unilateral kidney Monitor  Encephalopathy Mild encephalopathy in presentation in setting of severe hyponatremia Minimal confusion at present Grossly nonfocal neuroexam CT head within normal limits Will gently correct hyponatremia per nephrology recommendations Check ammonia level No overt infection noted Otherwise monitor closely  Adrenal mass (HCC) Noted history of adrenal mass status post partial adrenalectomy August 2023 Monitor  Chronic HFrEF (heart failure with reduced ejection fraction)  (HCC) 2D echo July 2023 with a EF of 40 to 45% Clinically dry in the setting of severe hyponatremia Gentle IV fluid hydration with noted hypertonic saline per nephrology recommendations Monitor volume status closely Hold spironolactone in setting of hyponatremia Follow  Hypertension BP stable Titrate home regimen  GERD (gastroesophageal reflux disease) PPI   Alcohol use Patient drinks roughly 1 bottle of wine daily CIWA protocol Monitor      Advance Care Planning:   Code Status: Full Code   Consults: Nephrology, critical care   Family Communication: Wife at the bedside   Severity of Illness: The  appropriate patient status for this patient is OBSERVATION. Observation status is judged to be reasonable and necessary in order to provide the required intensity of service to ensure the patient's safety. The patient's presenting symptoms, physical exam findings, and initial radiographic and laboratory data in the context of their medical condition is felt to place them at decreased risk for further clinical deterioration. Furthermore, it is anticipated that the patient will be medically stable for discharge from the hospital within 2 midnights of admission.   Author: Floydene Flock, MD 02/29/2024 3:45 PM  For on call review [email or web address].

## 2024-02-29 NOTE — ED Triage Notes (Signed)
 Pt here with SOB and weakness x2 days. Pt also states he has been having slurred speech and dizziness. Pt states he has been SOB for 1.5 days. Pt also having nausea x3 days. Pt denies cp.

## 2024-02-29 NOTE — ED Provider Notes (Signed)
 [facility] [place] Provider Note    Event Date/Time   First MD Initiated Contact with Patient 02/29/24 1240     (approximate)   History   Shortness of Breath and Weakness  Pt here with SOB and weakness x2 days. Pt also states he has been having slurred speech and dizziness. Pt states he has been SOB for 1.5 days. Pt also having nausea x3 days. Pt denies cp.   HPI Randy Walker is a 68 y.o. male PMH CHF, prior pulmonary embolism, hyperlipidemia, adrenal mass status post adrenalectomy (06/2022) anxiety presents for evaluation of shortness of breath, nausea, weakness, dizziness. -Patient has reportedly been easily fatigued and had generalized weakness over the past few months, worse over the past week and particularly over the past 2 days when he developed nausea and vomiting.  Tolerating minimal p.o. intake.  No abdominal pain.  No fevers.  No significant cough, urinary symptoms. -No recent medication changes -Wife has noted that he is slightly confused from baseline, not remembering some interactions but otherwise acting like himself  Patient was roomed emergently given triage BMP showing a sodium of 107.      Physical Exam   Triage Vital Signs: ED Triage Vitals  Encounter Vitals Group     BP 02/29/24 1204 116/80     Systolic BP Percentile --      Diastolic BP Percentile --      Pulse Rate 02/29/24 1204 85     Resp 02/29/24 1204 18     Temp 02/29/24 1204 97.7 F (36.5 C)     Temp Source 02/29/24 1204 Oral     SpO2 02/29/24 1204 98 %     Weight 02/29/24 1205 197 lb 1.5 oz (89.4 kg)     Height 02/29/24 1205 6\' 2"  (1.88 m)     Head Circumference --      Peak Flow --      Pain Score 02/29/24 1204 0     Pain Loc --      Pain Education --      Exclude from Growth Chart --     Most recent vital signs: Vitals:   02/29/24 1530 02/29/24 1600  BP: 123/80 122/84  Pulse: (!) 56 (!) 59  Resp: 16 18  Temp:    SpO2: 100% 100%     General: Awake, no  distress.  CV:  Good peripheral perfusion. RRR, RP 2+ Resp:  Normal effort. CTAB Abd:  No distention. Nontender to deep palpation throughout Neuro:  Aox3-4 (send urgent care instead of emergency department, did originally go to urgent care but was sent here from their triage desk), CN II-XII intact, FNF wnl, finger taps fast b/l, 5/5 strength in bilateral finger extension/grip, arm flexion/extension, EHL/FHL. BUE AG 10+ sec no drift, BLE AG 5+ sec no drift. Ambulates with steady gait. SILT. Negative [place].    ED Results / Procedures / Treatments   Labs (all labs ordered are listed, but only abnormal results are displayed) Labs Reviewed  BASIC METABOLIC PANEL WITH GFR - Abnormal; Notable for the following components:      Result Value   Sodium 107 (*)    Chloride 79 (*)    CO2 19 (*)    Glucose, Bld 113 (*)    Calcium 8.7 (*)    All other components within normal limits  CBC - Abnormal; Notable for the following components:   Platelets 144 (*)    All other components within normal limits  URINALYSIS,  COMPLETE (UACMP) WITH MICROSCOPIC - Abnormal; Notable for the following components:   Color, Urine YELLOW (*)    APPearance CLEAR (*)    Glucose, UA >=500 (*)    Ketones, ur 20 (*)    All other components within normal limits  SODIUM - Abnormal; Notable for the following components:   Sodium 109 (*)    All other components within normal limits  SODIUM, URINE, RANDOM  OSMOLALITY  OSMOLALITY, URINE  SODIUM  SODIUM  SODIUM  SODIUM  SODIUM  SODIUM  SODIUM  HIV ANTIBODY (ROUTINE TESTING W REFLEX)  SODIUM  SODIUM  CREATININE, URINE, RANDOM  CORTISOL  SODIUM  CBG MONITORING, ED     EKG  See ED course   RADIOLOGY See ED course below   PROCEDURES:  Critical Care performed: Yes, see critical care procedure note(s)  .Critical Care  Performed by: Marinell Blight, MD Authorized by: Marinell Blight, MD   Critical care provider statement:    Critical care time  (minutes):  30   Critical care was necessary to treat or prevent imminent or life-threatening deterioration of the following conditions: Severe hyponatremia requiring hypertonic saline.   Critical care was time spent personally by me on the following activities:  Development of treatment plan with patient or surrogate, discussions with consultants, evaluation of patient's response to treatment, examination of patient, ordering and review of laboratory studies, ordering and review of radiographic studies, ordering and performing treatments and interventions, pulse oximetry, re-evaluation of patient's condition and review of old charts   I assumed direction of critical care for this patient from another provider in my specialty: no     Care discussed with: admitting provider     Care discussed with comment:  Nephrologist    MEDICATIONS ORDERED IN ED: Medications  sodium chloride (hypertonic) 3 % solution ( Intravenous New Bag/Given 02/29/24 1409)  enoxaparin (LOVENOX) injection 40 mg (40 mg Subcutaneous Given 02/29/24 1538)  ondansetron (ZOFRAN) tablet 4 mg (has no administration in time range)    Or  ondansetron (ZOFRAN) injection 4 mg (has no administration in time range)  LORazepam (ATIVAN) tablet 1-4 mg (has no administration in time range)    Or  LORazepam (ATIVAN) injection 1-4 mg (has no administration in time range)  thiamine (VITAMIN B1) tablet 100 mg (has no administration in time range)    Or  thiamine (VITAMIN B1) injection 100 mg (has no administration in time range)  folic acid (FOLVITE) tablet 1 mg (has no administration in time range)  multivitamin with minerals tablet 1 tablet (has no administration in time range)  sodium chloride 0.9 % bolus 500 mL (0 mLs Intravenous Stopped 02/29/24 1327)  iohexol (OMNIPAQUE) 300 MG/ML solution 100 mL (100 mLs Intravenous Contrast Given 02/29/24 1355)     IMPRESSION / MDM / ASSESSMENT AND PLAN / ED COURSE  I reviewed the triage vital signs and  the nursing notes.                              DDX/MDM/AP: Differential diagnosis includes, but is not limited to, hyponatremia causing symptoms of fatigue, dizziness, nausea/vomiting, very mild confusion.  No seizures, not markedly altered on my eval, responds all questions appropriately.  Unclear etiology of underlying hyponatremia--given slow progression of symptoms and patient overall very well-appearing here, suspect he likely had slow worsening of hyponatremia.  He does have a history of prior nephrectomy and then subsequent adrenalectomy on remaining  kidney, consider underlying hormonal problem, SIADH, underlying malignancy, underlying infection (though no history to suggest this).  Denies any recent medication changes.  With history of recent nausea and vomiting and appearing mildly hypovolemic on my eval, will start with small bolus IV fluid and avoid very aggressive sodium correction, defer hypertonic saline at this time given patient is only very mildly confused and otherwise very well-appearing.  Fortunately no seizures.  Will continue to monitor closely, low threshold to escalate to hypertonic saline as needed.  Plan: -Labs -CT head - Chest x-ray -Admission, gentle sodium correction, suspect hypovolemic, hyponatremia labs added  Patient's presentation is most consistent with acute presentation with potential threat to life or bodily function.  The patient is on the cardiac monitor to evaluate for evidence of arrhythmia and/or significant heart rate changes.  ED course below.  Workup notable for severe hyponatremia.  Case discussed with nephrology, does recommend hypertonic saline (NS bolus stopped after 100 cc) as well as broad malignancy workup, initiated in emergency department.  Admitted to stepdown unit, appreciate hospitalist assistance.  Clinical Course as of 02/29/24 1635  Wed Feb 29, 2024  1256 CT head with no obvious hemorrhage nor edema on my interpretation, radiology  read pending [MM]  1257 Chest x-ray with no focal consolidation to suggest pneumonia nor masses on my interpretation, formal read pending [MM]  1258 Hospitalist consult order placed [MM]  1303 Ecg = sinus rhythm, rate 84, no ST elevation or depression, right bundle branch block present.  Left axis deviation.  Normal intervals.  No significant repolarization abnormality.  No evidence of ischemia on my read. [MM]  1316 Presented to Dr. Alvester Morin  [MM]  1319 D/w Dr. Cherylann Ratel of nephrology 3% saline at 30 cc/hr Hold 500cc NS bolus 2-3 hr sodium checks CTCAP for malignancy Stepdown or ICU  Hospitalist upgraded to stepdown  [MM]    Clinical Course User Index [MM] Marinell Blight, MD     FINAL CLINICAL IMPRESSION(S) / ED DIAGNOSES   Final diagnoses:  Weakness  Hyponatremia  Nausea and vomiting, unspecified vomiting type     Rx / DC Orders   ED Discharge Orders          Ordered    ondansetron (ZOFRAN-ODT) 4 MG disintegrating tablet  Every 8 hours PRN        02/29/24 1258             Note:  This document was prepared using Dragon voice recognition software and may include unintentional dictation errors.   Marinell Blight, MD 02/29/24 [phone]

## 2024-03-01 ENCOUNTER — Encounter: Payer: Self-pay | Admitting: Family Medicine

## 2024-03-01 DIAGNOSIS — I110 Hypertensive heart disease with heart failure: Secondary | ICD-10-CM | POA: Diagnosis present

## 2024-03-01 DIAGNOSIS — G9341 Metabolic encephalopathy: Secondary | ICD-10-CM | POA: Diagnosis present

## 2024-03-01 DIAGNOSIS — I5022 Chronic systolic (congestive) heart failure: Secondary | ICD-10-CM | POA: Diagnosis present

## 2024-03-01 DIAGNOSIS — Z79899 Other long term (current) drug therapy: Secondary | ICD-10-CM | POA: Diagnosis not present

## 2024-03-01 DIAGNOSIS — E871 Hypo-osmolality and hyponatremia: Secondary | ICD-10-CM | POA: Diagnosis present

## 2024-03-01 DIAGNOSIS — F17210 Nicotine dependence, cigarettes, uncomplicated: Secondary | ICD-10-CM | POA: Diagnosis present

## 2024-03-01 DIAGNOSIS — F419 Anxiety disorder, unspecified: Secondary | ICD-10-CM | POA: Diagnosis present

## 2024-03-01 DIAGNOSIS — Z85528 Personal history of other malignant neoplasm of kidney: Secondary | ICD-10-CM | POA: Diagnosis not present

## 2024-03-01 DIAGNOSIS — Z96642 Presence of left artificial hip joint: Secondary | ICD-10-CM | POA: Diagnosis present

## 2024-03-01 DIAGNOSIS — Z905 Acquired absence of kidney: Secondary | ICD-10-CM | POA: Diagnosis not present

## 2024-03-01 DIAGNOSIS — Z7952 Long term (current) use of systemic steroids: Secondary | ICD-10-CM | POA: Diagnosis not present

## 2024-03-01 DIAGNOSIS — R4781 Slurred speech: Secondary | ICD-10-CM | POA: Diagnosis present

## 2024-03-01 DIAGNOSIS — E896 Postprocedural adrenocortical (-medullary) hypofunction: Secondary | ICD-10-CM | POA: Diagnosis present

## 2024-03-01 DIAGNOSIS — I959 Hypotension, unspecified: Secondary | ICD-10-CM | POA: Diagnosis present

## 2024-03-01 DIAGNOSIS — Z860101 Personal history of adenomatous and serrated colon polyps: Secondary | ICD-10-CM | POA: Diagnosis not present

## 2024-03-01 DIAGNOSIS — Z8249 Family history of ischemic heart disease and other diseases of the circulatory system: Secondary | ICD-10-CM | POA: Diagnosis not present

## 2024-03-01 DIAGNOSIS — E271 Primary adrenocortical insufficiency: Secondary | ICD-10-CM | POA: Diagnosis present

## 2024-03-01 DIAGNOSIS — R5381 Other malaise: Secondary | ICD-10-CM | POA: Diagnosis present

## 2024-03-01 DIAGNOSIS — E785 Hyperlipidemia, unspecified: Secondary | ICD-10-CM | POA: Diagnosis present

## 2024-03-01 DIAGNOSIS — L4050 Arthropathic psoriasis, unspecified: Secondary | ICD-10-CM | POA: Diagnosis present

## 2024-03-01 DIAGNOSIS — F1020 Alcohol dependence, uncomplicated: Secondary | ICD-10-CM | POA: Diagnosis present

## 2024-03-01 DIAGNOSIS — Z7984 Long term (current) use of oral hypoglycemic drugs: Secondary | ICD-10-CM | POA: Diagnosis not present

## 2024-03-01 DIAGNOSIS — K219 Gastro-esophageal reflux disease without esophagitis: Secondary | ICD-10-CM | POA: Diagnosis present

## 2024-03-01 DIAGNOSIS — Z86711 Personal history of pulmonary embolism: Secondary | ICD-10-CM | POA: Diagnosis not present

## 2024-03-01 LAB — SODIUM
Sodium: 113 mmol/L — CL (ref 135–145)
Sodium: 113 mmol/L — CL (ref 135–145)
Sodium: 118 mmol/L — CL (ref 135–145)
Sodium: 117 mmol/L — CL (ref 135–145)
Sodium: 117 mmol/L — CL (ref 135–145)
Sodium: 119 mmol/L — CL (ref 135–145)
Sodium: 121 mmol/L — ABNORMAL LOW (ref 135–145)
Sodium: 120 mmol/L — ABNORMAL LOW (ref 135–145)
Sodium: 122 mmol/L — ABNORMAL LOW (ref 135–145)

## 2024-03-01 LAB — COMPREHENSIVE METABOLIC PANEL WITH GFR
ALT: 13 U/L (ref 0–44)
AST: 19 U/L (ref 15–41)
Albumin: 3.5 g/dL (ref 3.5–5.0)
Alkaline Phosphatase: 67 U/L (ref 38–126)
Anion gap: 9 (ref 5–15)
BUN: 16 mg/dL (ref 8–23)
CO2: 19 mmol/L — ABNORMAL LOW (ref 22–32)
Calcium: 8.6 mg/dL — ABNORMAL LOW (ref 8.9–10.3)
Chloride: 84 mmol/L — ABNORMAL LOW (ref 98–111)
Creatinine, Ser: 1.12 mg/dL (ref 0.61–1.24)
GFR, Estimated: >60 mL/min (ref >60)
Glucose, Bld: 112 mg/dL — ABNORMAL HIGH (ref 70–99)
Potassium: 4 mmol/L (ref 3.5–5.1)
Sodium: 112 mmol/L — CL (ref 135–145)
Total Bilirubin: 0.8 mg/dL (ref 0.0–1.2)
Total Protein: 6.2 g/dL — ABNORMAL LOW (ref 6.5–8.1)

## 2024-03-01 MED ORDER — ACETAMINOPHEN 325 MG PO TABS
650.0000 mg | ORAL_TABLET | Freq: Four times a day (QID) | ORAL | Status: DC | PRN
  Administered 2024-03-01 – 2024-03-02 (×3): 650 mg via ORAL
  Filled 2024-03-01 (×3): qty 2

## 2024-03-01 MED ORDER — SODIUM CHLORIDE 3 % IV SOLN
INTRAVENOUS | Status: DC
  Filled 2024-03-01 (×3): qty 500

## 2024-03-01 MED ORDER — METHOCARBAMOL 500 MG PO TABS
500.0000 mg | ORAL_TABLET | Freq: Once | ORAL | Status: AC
  Administered 2024-03-01: 500 mg via ORAL
  Filled 2024-03-01: qty 1

## 2024-03-01 MED ORDER — ACETAMINOPHEN 500 MG PO TABS
1000.0000 mg | ORAL_TABLET | Freq: Once | ORAL | Status: AC
  Administered 2024-03-01: 1000 mg via ORAL
  Filled 2024-03-01: qty 2

## 2024-03-01 NOTE — Progress Notes (Addendum)
 PHARMACY CONSULT NOTE - Hypertonic Saline  Pharmacy Consult for Hypertonic Saline Monitoring and Management  Recent Labs: Potassium (mmol/L)  Date Value  03/01/2024 4.0   Calcium (mg/dL)  Date Value  96/02/5408 8.6 (L)   Albumin (g/dL)  Date Value  81/19/1478 3.5  10/06/2021 4.4   Sodium (mmol/L)  Date Value  03/01/2024 117 (LL)  05/26/2022 133 (L)    Assessment  Randy Walker is a 68 y.o. male presenting with SOB and weakness. 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 Pharmacy has been consulted to monitor hypertonic saline (3%) infusion.  Pertinent medications: None  Baseline Labs: Serum Na 107, Serum Osm -, Urine Na -, Urine Osm -  Goal of Therapy:  Increase in Na by 4-6 mEq/L in 4-6 hours Do not exceed increase in Na by 8-10 mEq/L in 24 hours  Monitoring:  Date Time Na Rate/Comment  4/9 1205 107 Baseline 4/9 1409 - Na 3% Infusion started at 30 mL/hr 4/9 1509 109 Increased 2 mEq/L in ~2 hours 4/9 1825 112 Increased 5 mEq/L in ~6 hours 4/9       2046    110     Increased 3 mEq/L in ~ 8 hours 4/9       2217    111     4/10     0029    113     4/10     0323    112     3%NaCl increased to 40 ml/hr@0454  4/10 0611 113  4/10 0819 118 Holding 3% NaCl  4/10 1010 117  4/10 1242 117 Continuing to hold 3% NaCl will reassess at next labs 4/10 1637  Restarted 3% NaCl at 30 mL/hr  Plan:  Restarted 3% NaCl @ 1637 decreased to 30 mL/hr Checking Na Q2H, consider changing to Q4H if Na is replacing at a slower rate Stop infusion if  Na increases > 4 mEq/L in first 2 hours Na increases > 6 mEq/L in first 4 hours Na increases > 6 mEq/L in 6 hours  Na increases > 8 mEq/L in 8 hours (give D5W bolus) Continue to monitor for signs of clinical improvement and recommendations per nephrology  Thank you for allowing pharmacy to participate in this patient's care.  Effie Shy,  PharmD Pharmacy Resident  03/01/2024 12:21 PM

## 2024-03-01 NOTE — Consult Note (Signed)
 [facility]  CONSULT NOTE    Date: 03/01/2024                  Patient Name:  Randy Walker  MRN: [id number]  DOB: 10/29/1956  Age / Sex: 68 y.o., male         PCP: Corwin Levins, MD                 Service Requesting Consult: TRH                 Reason for Consult: Hyponatremia            History of Present Illness: Mr. Randy Walker is a 68 y.o.  male with past medical conditions including GERD, heart failure, kidney cancer status post nephrectomy, alcohol abuse, renal mass status post partial adrenalectomy, psoriasis, tobacco abuse, who was admitted to [facility] on 02/29/2024 for Hyponatremia [E87.1]  Patient seen resting in bed, no family or visitors present.  Patient states he has been progressively weaker over the past 2 to 3 days.  Also developed nausea.  Reports other multiple symptoms such as dizziness, shortness of breath, mild confusion.  Does continue to use tobacco, 3 to 4 cigarettes a day for over 30 years.  Denies cough.  Denies recent sick contacts.  Labs significant for sodium 107, CO2 19, serum osmolality 241, platelets 144.  UA appears clear with glucose and some ketones.  Chest x-ray negative for acute findings CT abdomen pelvis chest negative for acute findings.  Patient started on hypertonic saline in ED with increase overnight.   Medications: Outpatient medications: (Not in a hospital admission)   Current medications: Current Facility-Administered Medications  Medication Dose Route Frequency Provider Last Rate Last Admin   acetaminophen (TYLENOL) tablet 650 mg  650 mg Oral Q6H PRN Agbata, Tochukwu, MD   650 mg at 03/01/24 1241   enoxaparin (LOVENOX) injection 40 mg  40 mg Subcutaneous Q24H Floydene Flock, MD   40 mg at 03/01/24 1542   folic acid (FOLVITE) tablet 1 mg  1 mg Oral Daily Floydene Flock, MD   1 mg at 03/01/24 1002   LORazepam (ATIVAN) tablet 1-4 mg  1-4 mg Oral Q1H PRN Floydene Flock, MD   1 mg at 02/29/24 1831   Or    LORazepam (ATIVAN) injection 1-4 mg  1-4 mg Intravenous Q1H PRN Floydene Flock, MD       multivitamin with minerals tablet 1 tablet  1 tablet Oral Daily Floydene Flock, MD   1 tablet at 03/01/24 0950   ondansetron (ZOFRAN) tablet 4 mg  4 mg Oral Q6H PRN Floydene Flock, MD       Or   ondansetron [facility]) injection 4 mg  4 mg Intravenous Q6H PRN Floydene Flock, MD       sodium chloride (hypertonic) 3 % solution   Intravenous Continuous Randy Beavers, NP       thiamine (VITAMIN B1) tablet 100 mg  100 mg Oral Daily Floydene Flock, MD   100 mg at 03/01/24 5621   Or   thiamine (VITAMIN B1) injection 100 mg  100 mg Intravenous Daily Floydene Flock, MD       Current Outpatient Medications  Medication Sig Dispense Refill   empagliflozin (JARDIANCE) 10 MG TABS tablet Take 1 tablet (10 mg total) by mouth daily before breakfast. 90 tablet 3   FLUoxetine (PROZAC) 20 MG capsule Take 20 mg by  mouth daily.     Ibuprofen (ADVIL PO) Take by mouth daily as needed.     metoprolol succinate (TOPROL-XL) 100 MG 24 hr tablet TAKE 1 TABLET BY MOUTH ONCE A DAY. TAKE WITH OR IMMEDIATELY FOLLOWING A MEAL 90 tablet 3   ondansetron (ZOFRAN-ODT) 4 MG disintegrating tablet Take 1 tablet (4 mg total) by mouth every 8 (eight) hours as needed for nausea or vomiting. 20 tablet 0   pantoprazole (PROTONIX) 40 MG tablet Take 1 tablet (40 mg total) by mouth daily. 90 tablet 3   Risankizumab-rzaa (SKYRIZI PEN Gearhart) Inject 180 mg into the skin every 3 (three) months.     sacubitril-valsartan (ENTRESTO) 24-26 MG Take 1 tablet by mouth 2 (two) times daily. 180 tablet 3   spironolactone (ALDACTONE) 25 MG tablet TAKE ONE HALF TABLET (12.5MG ) BY MOUTH DAILY 45 tablet 3   docusate sodium (COLACE) 100 MG capsule Take 1 capsule (100 mg total) by mouth 2 (two) times daily. (Patient not taking: Reported on 05/05/2023)     HUMIRA PEN 40 MG/0.4ML PNKT Inject 40 mg into the skin every 14 (fourteen) days. Fridays. (Patient not taking:  Reported on 05/05/2023)        Allergies: No Known Allergies    Past Medical History: Past Medical History:  Diagnosis Date   Adenomatous polyps 11/17/2011   Alcohol use 11/17/2011   ALLERGIC RHINITIS 09/10/2007   ANXIETY 09/10/2007   BACK PAIN 12/31/2010   CHF (congestive heart failure) (HCC)    DISC DISEASE, LUMBAR 12/31/2010   GERD (gastroesophageal reflux disease) 11/23/2016   History of kidney cancer    Hyperglycemia 11/23/2016   HYPERLIPIDEMIA 12/31/2010   Hypertension    Personal history of rectal adenomas 01/29/2008   PSORIASIS 12/31/2010   Psoriatic arthritis (HCC)    PULMONARY EMBOLISM, HX OF 09/10/2007   RASH-NONVESICULAR 04/23/2009   Renal mass    SMOKER 12/31/2010     Past Surgical History: Past Surgical History:  Procedure Laterality Date   LYMPH NODE DISSECTION Bilateral 08/27/2020   Procedure: RETROPERITONEAL LYMPH NODE DISSECTION;  Surgeon: Sebastian Ache, MD;  Location: [facility];  Service: Urology;  Laterality: Bilateral;   RIGHT/LEFT HEART CATH AND CORONARY ANGIOGRAPHY N/A 12/15/2021   Procedure: RIGHT/LEFT HEART CATH AND CORONARY ANGIOGRAPHY;  Surgeon: Iran Ouch, MD;  Location: [facility] INVASIVE CV LAB;  Service: Cardiovascular;  Laterality: N/A;   ROBOT ASSISTED LAPAROSCOPIC NEPHRECTOMY Left 08/27/2020   Procedure: XI ROBOTIC ASSISTED LAPAROSCOPIC RADICAL NEPHRECTOMY;  Surgeon: Sebastian Ache, MD;  Location: [facility];  Service: Urology;  Laterality: Left;  3.5 HRS   ROBOTIC ADRENALECTOMY Right 07/07/2022   Procedure: XI ROBOTIC PARTIAL ADRENALECTOMY, INTRAOPERATIVE ULTRASOUND;  Surgeon: Sebastian Ache, MD;  Location: [facility];  Service: Urology;  Laterality: Right;   ROTATOR CUFF REPAIR  Jan, 2010   [facility], Left   spleen repair     TOTAL HIP ARTHROPLASTY Left 09/09/2017   Procedure: LEFT TOTAL HIP ARTHROPLASTY ANTERIOR APPROACH;  Surgeon: Jodi Geralds, MD;  Location: [facility];  Service: Orthopedics;  Laterality: Left;   WISDOM TOOTH EXTRACTION        Family History: Family History  Problem Relation Age of Onset   Heart attack Father    Colon cancer Neg Hx    Esophageal cancer Neg Hx    Rectal cancer Neg Hx    Stomach cancer Neg Hx      Social History: Social History   Socioeconomic History   Marital status: Married    Spouse name: Not on  file   Number of children: Not on file   Years of education: Not on file   Highest education level: Not on file  Occupational History   Occupation: Sports administrator  Tobacco Use   Smoking status: Every Day    Current packs/day: 0.25    Average packs/day: 0.3 packs/day for 40.0 years (10.0 ttl pk-yrs)    Types: Cigarettes   Smokeless tobacco: Never   Tobacco comments:    Restarted in March 2021  Vaping Use   Vaping status: Never Used  Substance and Sexual Activity   Alcohol use: Yes    Alcohol/week: 21.0 standard drinks of alcohol    Types: 21 Standard drinks or equivalent per week    Comment: 3 Bourbon daily   Drug use: No   Sexual activity: Not on file  Other Topics Concern   Not on file  Social History Narrative   Not on file   Social Drivers of Health   Financial Resource Strain: Not on file  Food Insecurity: Not on file  Transportation Needs: Not on file  Physical Activity: Not on file  Stress: Not on file  Social Connections: Not on file  Intimate Partner Violence: Not on file     Review of Systems: Review of Systems  Constitutional:  Negative for chills, fever and malaise/fatigue.  HENT:  Negative for congestion, sore throat and tinnitus.   Eyes:  Negative for blurred vision and redness.  Respiratory:  Positive for shortness of breath. Negative for cough and wheezing.   Cardiovascular:  Negative for chest pain, palpitations, claudication and leg swelling.  Gastrointestinal:  Positive for abdominal pain, nausea and vomiting. Negative for blood in stool and diarrhea.  Genitourinary:  Negative for flank pain, frequency and hematuria.  Musculoskeletal:   Negative for back pain, falls and myalgias.  Skin:  Negative for rash.  Neurological:  Positive for dizziness. Negative for weakness and headaches.  Endo/Heme/Allergies:  Does not bruise/bleed easily.  Psychiatric/Behavioral:  Negative for depression. The patient is not nervous/anxious and does not have insomnia.     Vital Signs: Blood pressure 91/69, pulse 61, temperature 98.2 F (36.8 C), temperature source Oral, resp. rate 14, height 6\' 2"  (1.88 m), weight 89.4 kg, SpO2 96%.  Weight trends: Filed Weights   02/29/24 1205  Weight: 89.4 kg    Physical Exam: General: NAD,   Head: Normocephalic, atraumatic. Moist oral mucosal membranes  Eyes: Anicteric  Lungs:  Clear to auscultation, normal effort  Heart: Regular rate and rhythm  Abdomen:  Soft, nontender, nondistended  Extremities: No peripheral edema.  Neurologic: Nonfocal, moving all four extremities  Skin: No lesions        Lab results: Basic Metabolic Panel: Recent Labs  Lab 02/29/24 1205 02/29/24 1509 03/01/24 0323 03/01/24 0611 03/01/24 0819 03/01/24 1010 03/01/24 1242  NA 107*   < > 112*   < > 118* 117* 117*  K 4.5  --  4.0  --   --   --   --   CL 79*  --  84*  --   --   --   --   CO2 19*  --  19*  --   --   --   --   GLUCOSE 113*  --  112*  --   --   --   --   BUN 16  --  16  --   --   --   --   CREATININE 1.16  --  1.12  --   --   --   --  CALCIUM 8.7*  --  8.6*  --   --   --   --    < > = values in this interval not displayed.    Liver Function Tests: Recent Labs  Lab 03/01/24 0323  AST 19  ALT 13  ALKPHOS 67  BILITOT 0.8  PROT 6.2*  ALBUMIN 3.5   No results for input(s): "LIPASE", "AMYLASE" in the last 168 hours. No results for input(s): "AMMONIA" in the last 168 hours.  CBC: Recent Labs  Lab 02/29/24 1205  WBC 5.5  HGB 15.6  HCT RESULTS UNAVAILABLE DUE TO INTERFERING SUBSTANCE  MCV RESULTS UNAVAILABLE DUE TO INTERFERING SUBSTANCE  PLT 144*    Cardiac Enzymes: No results for  input(s): "CKTOTAL", "CKMB", "CKMBINDEX", "TROPONINI" in the last 168 hours.  BNP: Invalid input(s): "POCBNP"  CBG: No results for input(s): "GLUCAP" in the last 168 hours.  Microbiology: Results for orders placed or performed during the hospital encounter of 08/23/20  SARS CORONAVIRUS 2 (TAT 6-24 HRS) Nasopharyngeal Nasopharyngeal Swab     Status: None   Collection Time: 08/23/20  2:33 PM   Specimen: Nasopharyngeal Swab  Result Value Ref Range Status   SARS Coronavirus 2 NEGATIVE NEGATIVE Final    Comment: (NOTE) SARS-CoV-2 target nucleic acids are NOT DETECTED.  The SARS-CoV-2 RNA is generally detectable in upper and lower respiratory specimens during the acute phase of infection. Negative results do not preclude SARS-CoV-2 infection, do not rule out co-infections with other pathogens, and should not be used as the sole basis for treatment or other patient management decisions. Negative results must be combined with clinical observations, patient history, and epidemiological information. The expected result is Negative.  Fact Sheet for Patients: HairSlick.no  Fact Sheet for Healthcare Providers: quierodirigir.com  This test is not yet approved or cleared by the [place] FDA and  has been authorized for detection and/or diagnosis of SARS-CoV-2 by FDA under an Emergency Use Authorization (EUA). This EUA will remain  in effect (meaning this test can be used) for the duration of the COVID-19 declaration under Se ction 564(b)(1) of the Act, 21 U.S.C. section 360bbb-3(b)(1), unless the authorization is terminated or revoked sooner.  Performed at [facility], [address] [place], [place] [address]     Coagulation Studies: No results for input(s): "LABPROT", "INR" in the last 72 hours.  Urinalysis: Recent Labs    02/29/24 1511  COLORURINE YELLOW*  LABSPEC 1.023  PHURINE 5.0  GLUCOSEU >=500*  HGBUR  NEGATIVE  BILIRUBINUR NEGATIVE  KETONESUR 20*  PROTEINUR NEGATIVE  NITRITE NEGATIVE  LEUKOCYTESUR NEGATIVE      Imaging: CT CHEST ABDOMEN PELVIS W CONTRAST Result Date: 02/29/2024 CLINICAL DATA:  Weakness and shortness of breath. History of renal cell carcinoma. EXAM: CT CHEST, ABDOMEN, AND PELVIS WITH CONTRAST TECHNIQUE: Multidetector CT imaging of the chest, abdomen and pelvis was performed following the standard protocol during bolus administration of intravenous contrast. RADIATION DOSE REDUCTION: This exam was performed according to the departmental dose-optimization program which includes automated exposure control, adjustment of the mA and/or kV according to patient size and/or use of iterative reconstruction technique. CONTRAST:  OMNIPAQUE IOHEXOL 300 MG/ML  SOLN COMPARISON:  CT abdomen/pelvis dated 01/24/2024. CTA chest dated 07/15/2023. FINDINGS: CT CHEST FINDINGS Cardiovascular: Aneurysmal dilatation of the ascending thoracic aorta measuring 4.1 cm in diameter, unchanged. No evidence of central pulmonary embolus. Normal heart size. No pericardial effusion. Mild atherosclerotic calcification of the thoracic aorta. Mediastinum/Nodes: No pathologically enlarged mediastinal, hilar, or axillary  lymph nodes. Thyroid gland, trachea, and esophagus demonstrate no significant findings. Lungs/Pleura: Upper lobe predominant centrilobular and paraseptal emphysema. Diffuse bronchial wall thickening. Stable 6 mm nodular density in the right upper lobe and 4 mm right middle lobe nodule. No new suspicious or enlarging pulmonary nodule or mass. No focal consolidation, pleural effusion, or pneumothorax. Musculoskeletal: No acute osseous abnormality. No suspicious osseous lesion. CT ABDOMEN PELVIS FINDINGS Hepatobiliary: Stable scattered subcentimeter hypodensities are too small to definitively characterize. Gallbladder is unremarkable. No biliary dilatation. 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 No pancreatic ductal dilation or inflammatory changes. Spleen: No splenomegaly. Adrenals/Urinary Tract: Status post left nephrectomy and adrenalectomy. No abnormal soft tissue or fluid collection in the surgical bed. Stable low density collection in the resection bed of the right adrenal nodule. Similar 5 mm subtle nodularity in the right adrenal gland (series 2, image 51). No suspicious right renal lesion. No right-sided urolithiasis or hydronephrosis. Bladder is unremarkable. Stomach/Bowel: Stomach is within normal limits. Appendix appears normal. No evidence of bowel wall thickening, distention, or inflammatory changes. Colonic diverticulosis without evidence of acute diverticulitis. Vascular/Lymphatic: The abdominal aorta is normal in caliber with mild atherosclerotic calcification. No pathologically enlarged abdominopelvic lymph nodes. Reproductive: Prostate is unremarkable. Other: Abdominopelvic ascites.  No intraperitoneal free air. Musculoskeletal: No suspicious osseous lesion. No acute osseous abnormality. Left total hip arthroplasty with associated streak artifact. Multilevel degenerative changes of the spine. IMPRESSION: 1. No acute localizing findings in the abdomen or pelvis. 2. 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 Recommend annual imaging followup by CTA or MRA. This recommendation follows 2010 ACCF/AHA/AATS/ACR/ASA/SCA/SCAI/SIR/STS/SVM Guidelines for the Diagnosis and Management of Patients with Thoracic Aortic Disease. Circulation. 2010; 121: M841-L244. Aortic aneurysm NOS (ICD10-I71.9) 3. Stable 6 mm nodule in the right upper lobe and 4 mm nodule in the right middle lobe. No new suspicious or enlarging pulmonary nodule or mass. 4. Prior left nephrectomy and adrenalectomy. No evidence to suggest recurrence in the surgical bed. 5. Similar subtle nodularity in the right adrenal gland measuring  approximately 5 mm is nonspecific. Recommend continued attention on short-term interval follow-up imaging. 6. 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 Recommend continued attention on follow-up imaging. 7. Additional unchanged chronic findings, as described above. Aortic Atherosclerosis (ICD10-I70.0). Electronically Signed   By: Hart Robinsons M.D.   On: 02/29/2024 16:44   CT HEAD WO CONTRAST Result Date: 02/29/2024 CLINICAL DATA:  Neurologic deficit. EXAM: CT HEAD WITHOUT CONTRAST TECHNIQUE: Contiguous axial images were obtained from the base of the skull through the vertex without intravenous contrast. RADIATION DOSE REDUCTION: This exam was performed according to the departmental dose-optimization program which includes automated exposure control, adjustment of the mA and/or kV according to patient size and/or use of iterative reconstruction technique. COMPARISON:  None Available. FINDINGS: Brain: The ventricles and sulci are appropriate size for the patient's age. The gray-white matter discrimination is preserved. There is no acute intracranial hemorrhage. No mass effect or midline shift. No extra-axial fluid collection. Vascular: No hyperdense vessel or unexpected calcification. Skull: Normal. Negative for fracture or focal lesion. Sinuses/Orbits: No acute finding. Other: None IMPRESSION: No acute intracranial pathology. Electronically Signed   By: Elgie Collard M.D.   On: 02/29/2024 14:52   DG Chest 2 View Result Date: 02/29/2024 CLINICAL DATA:  Shortness of breath and weakness for the past 2 days. Slurred speech and dizziness. Nausea. EXAM: CHEST -  2 VIEW COMPARISON:  06/19/2020 FINDINGS: Normal sized heart. Tortuous aorta. Clear lungs with normal vascularity. Midthoracic spine degenerative changes. IMPRESSION: No acute abnormality. Electronically Signed   By: Beckie Salts M.D.   On: 02/29/2024 14:22     Assessment & Plan: Mr. JABRON WEESE is a  68 y.o.  male with past medical conditions including GERD, heart failure, kidney cancer status post nephrectomy, alcohol abuse, renal mass status post partial adrenalectomy, psoriasis, tobacco abuse, who was admitted to [facility] on 02/29/2024 for Hyponatremia [E87.1]  Hyponatremia likely secondary to decreased solute intake.  Sodium 107 on admission.  Hypertonic saline ordered in emergency department.  Increased overnight.  Sodium 113 this morning with follow-up 118.  Hypertonic saline stopped and restarted this afternoon at 30 mL/h.  Patient encouraged to increase oral intake and limit fluids for this time.   LOS: 0 Kalif Kattner 4/10/20254:29 PM

## 2024-03-01 NOTE — ED Notes (Signed)
 Pt arrived to room

## 2024-03-01 NOTE — ED Notes (Signed)
 Lab called and reported a critical sodium level of 113. Manuela Schwartz, MD paged and notified.

## 2024-03-01 NOTE — Progress Notes (Signed)
       CROSS COVER NOTE  NAME: Randy Walker MRN: [id number] DOB : 02/01/1956 ATTENDING PHYSICIAN: Floydene Flock, MD    Date of Service   03/01/2024   HPI/Events of Note   Monitoring of sodium levels - discussion with pharmacy Hypertonic saline infusion at 30 ml/h thought out shift Patient also complaining of hand cramps per nurse  Interventions   Assessment/Plan:  Latest Reference Range & Units 02/29/24 18:25 02/29/24 20:46 02/29/24 22:17 03/01/24 00:29 03/01/24 03:23  Sodium 135 - 145 mmol/L 112 (LL) 110 (LL) 111 (LL) 113 (LL) 112 (LL)  (LL): Data is critically low   Hypertonic saline infusion increased to 40 ml/h with repeat sodium level in 3 hours Robaxin 500 mg and acetaminophen 1000 mg for hand cramps       Donnie Mesa NP [facility] Cross Cover 7pm-7am - check amion for availability Pager [phone]

## 2024-03-01 NOTE — Progress Notes (Addendum)
 PHARMACY CONSULT NOTE - Hypertonic Saline  Pharmacy Consult for Hypertonic Saline Monitoring and Management  Recent Labs: Potassium (mmol/L)  Date Value  03/01/2024 4.0   Calcium (mg/dL)  Date Value  13/06/6577 8.6 (L)   Albumin (g/dL)  Date Value  46/96/2952 3.5  10/06/2021 4.4   Sodium (mmol/L)  Date Value  03/01/2024 112 (LL)  05/26/2022 133 (L)    Assessment  Randy Walker is a 68 y.o. male presenting with SOB and weakness. 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 Pharmacy has been consulted to monitor hypertonic saline (3%) infusion.  Pertinent medications: None  Baseline Labs: Serum Na 107, Serum Osm -, Urine Na -, Urine Osm -  Goal of Therapy:  Increase in Na by 4-6 mEq/L in 4-6 hours Do not exceed increase in Na by 10-12 mEq/L in 24 hours  Monitoring:  Date Time Na Rate/Comment  4/9 1205 107 Baseline 4/9 1409 - Na 3% Infusion started at 30 mL/hr 4/9 1509 109 Increased 2 mEq/L in ~2 hours 4/9 1825 112 Increased 5 mEq/L in ~6 hours 4/9       2046    110     Increased 3 mEq/L in ~ 8 hours 4/9       2217    111     4/10     0029    113     4/10     0323    112     3%NaCl increased to 40 ml/hr  Plan:  4/10:  Na @ 0323 = 112 - NP increased 3% NaCl to 40 ml/hr Check Na Q2H x2 then Q4H  Stop infusion if  Na increases > 4 mEq/L in first 2 hours Na increases > 6 mEq/L in first 4 hours Na increases > 6 mEq/L in 6 hours  Na increases > 8 mEq/L in 8 hours (give D5W bolus) Continue to monitor for signs of clinical improvement and recommendations per nephrology  Gillermo Poch D PharmD Clinical Pharmacist 02/29/24 4:06 AM

## 2024-03-01 NOTE — Progress Notes (Signed)
 PHARMACY CONSULT NOTE - Hypertonic Saline  Pharmacy Consult for Hypertonic Saline Monitoring and Management  Recent Labs: Potassium (mmol/L)  Date Value  03/01/2024 4.0   Calcium (mg/dL)  Date Value  09/81/1914 8.6 (L)   Albumin (g/dL)  Date Value  78/29/5621 3.5  10/06/2021 4.4   Sodium (mmol/L)  Date Value  03/01/2024 113 (LL)  05/26/2022 133 (L)    Assessment  Randy Walker is a 68 y.o. male presenting with SOB and weakness. 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 Pharmacy has been consulted to monitor hypertonic saline (3%) infusion.  Pertinent medications: None  Baseline Labs: Serum Na 107, Serum Osm -, Urine Na -, Urine Osm -  Goal of Therapy:  Increase in Na by 4-6 mEq/L in 4-6 hours Do not exceed increase in Na by 10-12 mEq/L in 24 hours  Monitoring:  Date Time Na Rate/Comment  4/9 1205 107 Baseline 4/9 1409 - Na 3% Infusion started at 30 mL/hr 4/9 1509 109 Increased 2 mEq/L in ~2 hours 4/9 1825 112 Increased 5 mEq/L in ~6 hours 4/9       2046    110     Increased 3 mEq/L in ~ 8 hours 4/9       2217    111     4/10     0029    113     4/10     0323    112     3%NaCl increased to 40 ml/hr@0454  4/10 0611 113  Plan:  - NP increased 3% NaCl to 40 ml/hr 4/10 am Check Na Q4H  Stop infusion if  Na increases > 4 mEq/L in first 2 hours Na increases > 6 mEq/L in first 4 hours Na increases > 6 mEq/L in 6 hours  Na increases > 8 mEq/L in 8 hours (give D5W bolus) Continue to monitor for signs of clinical improvement and recommendations per nephrology  Bari Mantis A PharmD Clinical Pharmacist 02/29/24 7:16 AM

## 2024-03-01 NOTE — ED Notes (Signed)
 Pt has not been physically brought to surge room yet

## 2024-03-01 NOTE — ED Notes (Signed)
 Lab reported critical sodium of 112. Manuela Schwartz, NP notified. Pt also discussed cramping and pain in bilateral hands. Addressed this with Manuela Schwartz, NP as well.

## 2024-03-01 NOTE — Progress Notes (Signed)
 PHARMACY CONSULT NOTE - Hypertonic Saline  Pharmacy Consult for Hypertonic Saline Monitoring and Management  Recent Labs: Potassium (mmol/L)  Date Value  02/29/2024 4.5   Calcium (mg/dL)  Date Value  60/45/4098 8.7 (L)   Albumin (g/dL)  Date Value  11/91/4782 3.9  10/06/2021 4.4   Sodium (mmol/L)  Date Value  02/29/2024 111 (LL)  05/26/2022 133 (L)    Assessment  Randy Walker is a 68 y.o. male presenting with SOB and weakness. 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 Pharmacy has been consulted to monitor hypertonic saline (3%) infusion.  Pertinent medications: None  Baseline Labs: Serum Na 107, Serum Osm -, Urine Na -, Urine Osm -  Goal of Therapy:  Increase in Na by 4-6 mEq/L in 4-6 hours Do not exceed increase in Na by 10-12 mEq/L in 24 hours  Monitoring:  Date Time Na Rate/Comment  4/9 1205 107 Baseline 4/9 1409 - Na 3% Infusion started at 30 mL/hr 4/9 1509 109 Increased 2 mEq/L in ~2 hours 4/9 1825 112 Increased 5 mEq/L in ~6 hours 4/9       2046    110     Increased 3 mEq/L in ~ 8 hours 4/9       2217    111          Plan:  Initiate 3% saline at 30 mL/hr Check Na Q2H x2 then Q4H  Stop infusion if  Na increases > 4 mEq/L in first 2 hours Na increases > 6 mEq/L in first 4 hours Na increases > 6 mEq/L in 6 hours  Na increases > 8 mEq/L in 8 hours (give D5W bolus) Continue to monitor for signs of clinical improvement and recommendations per nephrology  Jenevie Casstevens D PharmD Clinical Pharmacist 02/29/24 12:16 AM

## 2024-03-01 NOTE — Progress Notes (Signed)
 Progress Note   Patient: Randy Walker:[id number] DOB: 09/07/1956 DOA: 02/29/2024     0 DOS: the patient was seen and examined on 03/01/2024   Brief hospital course: Randy Walker is a 68 y.o. male with medical history significant of alcohol abuse, HFrEF, GERD, history of kidney cancer status post nephrectomy, history of renal mass status post partial adrenalectomy, psoriasis, tobacco abuse presenting with encephalopathy, severe hyponatremia.  Patient reports roughly 3 days of recurrent nausea and vomiting.  Minimal abdominal pain.  Minimal diarrhea.  Positive mild shortness of breath.  Smoking 1/4 to 1/2 pack/day.  No fevers or chills.  No reported sick contacts.  Drinks roughly 1 bottle of wine daily.  No reported sick contacts.  On spironolactone in setting of chronic HFrEF.  Reports very minimal fluid and solid intake over the past 3 to 4 days.  Has had worsening malaise and weakness.  No focal hemiparesis. No reported slurred speech.  Mild confusion. Presented to the ER afebrile, hemodynamically stable.  Satting well on room air.  White count 5.5, hemoglobin 15.6, platelets 144, creatinine 1.16, sodium 107.  Chest x-ray, CT head grossly stable.  CT abdomen pelvis pending. Review of Systems: As mentioned in the history of present illness. All other systems reviewed and are negative.   Assessment and Plan:  * Hyponatremia Severe hyponatremia with sodium of 107 on presentation Suspect likely multifactorial and related to dehydration/decreased p.o. intake, offending medications including spironolactone/fluoxetine and  alcohol abuse Appreciate nephrology input Continue hypertonic saline Check serial sodium levels every 4 hours    Acute metabolic encephalopathy Mild encephalopathy in setting of severe hyponatremia Improved and patient is back to baseline    Adrenal mass [facility]) Noted history of adrenal mass status post partial adrenalectomy August 2023 Monitor    Chronic HFrEF  (heart failure with reduced ejection fraction) (HCC) 2D echo July 2023 with a EF of 40 to 45% Clinically dry in the setting of severe hyponatremia Entresto and metoprolol on hold due to relative hypotension Spironolactone is on hold due to hyponatremia    Hypertension Patient is normotensive   GERD (gastroesophageal reflux disease) PPI     Alcohol use Patient drinks roughly 1 bottle of wine daily Monitor closely for signs and symptoms of alcohol withdrawal Continue CIWA protocol       Subjective: Patient is seen and examined at the bedside.  He is sitting up in bed  Physical Exam: Vitals:   03/01/24 0900 03/01/24 0931 03/01/24 1206 03/01/24 1424  BP: 104/67 98/73  91/69  Pulse: 62 77  61  Resp: 15 17  14   Temp:   (!) 97.4 F (36.3 C)   TempSrc:   Oral   SpO2: 100% 98%  96%  Weight:      Height:       Constitutional:      Appearance: He is normal weight.  HENT:     Head: Normocephalic and atraumatic.     Nose: Nose normal.     Mouth/Throat:     Mouth: Mucous membranes are dry.  Eyes:     Pupils: Pupils are equal, round, and reactive to light.  Cardiovascular:     Rate and Rhythm: Normal rate and regular rhythm.  Pulmonary:     Effort: Pulmonary effort is normal.  Abdominal:     General: Bowel sounds are normal.  Musculoskeletal:        General: Normal range of motion.  Skin:    General: Skin is dry.  Neurological:     General: No focal deficit present.  Psychiatric:        Mood and Affect: Mood normal.     Data Reviewed: Sodium 117 Labs reviewed  Family Communication: Plan of care was discussed with patient in detail.  He verbalizes understanding and agrees with the plan  Disposition: Status is: Inpatient Remains inpatient appropriate because: Treatment of hyponatremia  Planned Discharge Destination: Home    Time spent: 40 minutes  Author: Lucile Shutters, MD 03/01/2024 3:25 PM  For on call review [email or web address].

## 2024-03-01 NOTE — ED Notes (Addendum)
 This nurse spoke with Manuela Schwartz, NP. Pt's repeat sodium to be obtained at 0319 instead of 0119 per provider verbal order.

## 2024-03-01 NOTE — Progress Notes (Signed)
 PHARMACY CONSULT NOTE - Hypertonic Saline  Pharmacy Consult for Hypertonic Saline Monitoring and Management  Recent Labs: Potassium (mmol/L)  Date Value  03/01/2024 4.0   Calcium (mg/dL)  Date Value  81/19/1478 8.6 (L)   Albumin (g/dL)  Date Value  29/56/2130 3.5  10/06/2021 4.4   Sodium (mmol/L)  Date Value  03/01/2024 122 (L)  05/26/2022 133 (L)    Assessment  Randy Walker is a 68 y.o. male presenting with SOB and weakness. 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 Pharmacy has been consulted to monitor hypertonic saline (3%) infusion.  Pertinent medications: None  Baseline Labs: Serum Na 107, Serum Osm -, Urine Na -, Urine Osm -  Goal of Therapy:  Increase in Na by 4-6 mEq/L in 4-6 hours Do not exceed increase in Na by 8-10 mEq/L in 24 hours  Monitoring:  Date Time Na Rate/Comment  4/9 1205 107 Baseline 4/9 1409 - Na 3% Infusion started at 30 mL/hr 4/9 1509 109 Increased 2 mEq/L in ~2 hours 4/9 1825 112 Increased 5 mEq/L in ~6 hours 4/9       2046    110     Increased 3 mEq/L in ~ 8 hours 4/9       2217    111     4/10     0029    113     4/10     0323    112     3%NaCl increased to 40 ml/hr@0454  4/10 0611 113  4/10 0819 118 Holding 3% NaCl  4/10 1010 117  4/10 1242 117 Continuing to hold 3% NaCl will reassess at next labs 4/10 1637  Restarted 3% NaCl at 30 mL/hr 4/10     1759    121 4/10     2011    120 4/10     2226    122   Plan:  Restarted 3% NaCl @ 1637 decreased to 30 mL/hr Checking Na Q2H, consider changing to Q4H if Na is replacing at a slower rate Stop infusion if  Na increases > 4 mEq/L in first 2 hours Na increases > 6 mEq/L in first 4 hours Na increases > 6 mEq/L in 6 hours  Na increases > 8 mEq/L in 8 hours (give D5W bolus) Continue to monitor for signs of clinical improvement and recommendations per nephrology  Thank you for allowing pharmacy to  participate in this patient's care.  Aydden Cumpian D, PharmD 03/01/2024 11:25 PM

## 2024-03-02 ENCOUNTER — Encounter: Payer: Self-pay | Admitting: Family Medicine

## 2024-03-02 ENCOUNTER — Other Ambulatory Visit: Payer: Self-pay

## 2024-03-02 DIAGNOSIS — E271 Primary adrenocortical insufficiency: Secondary | ICD-10-CM | POA: Diagnosis not present

## 2024-03-02 LAB — SODIUM
Sodium: 123 mmol/L — ABNORMAL LOW (ref 135–145)
Sodium: 124 mmol/L — ABNORMAL LOW (ref 135–145)
Sodium: 125 mmol/L — ABNORMAL LOW (ref 135–145)
Sodium: 127 mmol/L — ABNORMAL LOW (ref 135–145)

## 2024-03-02 LAB — CBC
HCT: 34.1 % — ABNORMAL LOW (ref 39.0–52.0)
Hemoglobin: 12.7 g/dL — ABNORMAL LOW (ref 13.0–17.0)
MCH: 35.9 pg — ABNORMAL HIGH (ref 26.0–34.0)
MCHC: 37.2 g/dL — ABNORMAL HIGH (ref 30.0–36.0)
MCV: 96.3 fL (ref 80.0–100.0)
Platelets: 141 10*3/uL — ABNORMAL LOW (ref 150–400)
RBC: 3.54 MIL/uL — ABNORMAL LOW (ref 4.22–5.81)
RDW: 12.6 % (ref 11.5–15.5)
WBC: 3.6 10*3/uL — ABNORMAL LOW (ref 4.0–10.5)
nRBC: 0.6 % — ABNORMAL HIGH (ref 0.0–0.2)

## 2024-03-02 LAB — BASIC METABOLIC PANEL WITH GFR
Anion gap: 7 (ref 5–15)
BUN: 13 mg/dL (ref 8–23)
CO2: 24 mmol/L (ref 22–32)
Calcium: 8.7 mg/dL — ABNORMAL LOW (ref 8.9–10.3)
Chloride: 97 mmol/L — ABNORMAL LOW (ref 98–111)
Creatinine, Ser: 1.13 mg/dL (ref 0.61–1.24)
GFR, Estimated: >60 mL/min (ref >60)
Glucose, Bld: 158 mg/dL — ABNORMAL HIGH (ref 70–99)
Potassium: 4 mmol/L (ref 3.5–5.1)
Sodium: 128 mmol/L — ABNORMAL LOW (ref 135–145)

## 2024-03-02 LAB — CORTISOL: Cortisol, Plasma: >100 ug/dL

## 2024-03-02 MED ORDER — TRAZODONE HCL 50 MG PO TABS
50.0000 mg | ORAL_TABLET | Freq: Once | ORAL | Status: AC
  Administered 2024-03-02: 50 mg via ORAL
  Filled 2024-03-02: qty 1

## 2024-03-02 MED ORDER — HYDROCORTISONE SOD SUC (PF) 100 MG IJ SOLR
100.0000 mg | Freq: Two times a day (BID) | INTRAMUSCULAR | Status: DC
  Administered 2024-03-02: 100 mg via INTRAVENOUS
  Filled 2024-03-02 (×2): qty 2

## 2024-03-02 MED ORDER — VITAMIN B-1 100 MG PO TABS
100.0000 mg | ORAL_TABLET | Freq: Every day | ORAL | 0 refills | Status: AC
  Filled 2024-03-02: qty 30, 30d supply, fill #0

## 2024-03-02 MED ORDER — ADULT MULTIVITAMIN W/MINERALS CH
1.0000 | ORAL_TABLET | Freq: Every day | ORAL | 0 refills | Status: AC
  Filled 2024-03-02: qty 30, 30d supply, fill #0

## 2024-03-02 MED ORDER — HYDROCORTISONE 10 MG PO TABS
10.0000 mg | ORAL_TABLET | Freq: Three times a day (TID) | ORAL | 1 refills | Status: AC
  Filled 2024-03-02: qty 90, 30d supply, fill #0
  Filled 2024-03-02: qty 90, fill #0

## 2024-03-02 MED ORDER — FLUDROCORTISONE ACETATE 0.1 MG PO TABS
0.1000 mg | ORAL_TABLET | Freq: Every day | ORAL | 0 refills | Status: AC
  Filled 2024-03-02: qty 30, 30d supply, fill #0

## 2024-03-02 MED ORDER — FOLIC ACID 1 MG PO TABS
1.0000 mg | ORAL_TABLET | Freq: Every day | ORAL | 0 refills | Status: AC
  Filled 2024-03-02: qty 30, 30d supply, fill #0

## 2024-03-02 MED ORDER — SODIUM CHLORIDE 1 G PO TABS
1.0000 g | ORAL_TABLET | Freq: Two times a day (BID) | ORAL | 0 refills | Status: AC
  Filled 2024-03-02: qty 28, 14d supply, fill #0

## 2024-03-02 MED ORDER — SODIUM CHLORIDE 1 G PO TABS
1.0000 g | ORAL_TABLET | Freq: Two times a day (BID) | ORAL | Status: DC
  Filled 2024-03-02: qty 1

## 2024-03-02 NOTE — Discharge Summary (Signed)
 Physician Discharge Summary   Patient: Randy Walker MRN: [id number] DOB: 04-Jun-1956  Admit date:     02/29/2024  Discharge date: 03/02/24  Discharge Physician: Uziel Covault   PCP: Corwin Levins, MD   Recommendations at discharge:   Follow-up with endocrinology as an outpatient Follow-up with nephrology as an outpatient Take medications as recommended  Discharge Diagnoses: Principal Problem:   Adrenal insufficiency (Addison's disease) (HCC) Active Problems:   Hyponatremia   Encephalopathy   Alcohol use   GERD (gastroesophageal reflux disease)   Hypertension   Chronic HFrEF (heart failure with reduced ejection fraction) (HCC)   Adrenal mass (HCC)  Resolved Problems:   * No resolved hospital problems. *  Hospital Course:  Randy Walker is a 68 y.o. male with medical history significant of alcohol abuse, HFrEF, GERD, history of kidney cancer status post nephrectomy, history of renal mass status post partial adrenalectomy, psoriasis, tobacco abuse presenting with encephalopathy, severe hyponatremia.  Patient reports roughly 3 days of recurrent nausea and vomiting.  Minimal abdominal pain.  Minimal diarrhea.  Positive mild shortness of breath.  Smoking 1/4 to 1/2 pack/day.  No fevers or chills.  No reported sick contacts.  Drinks roughly 1 bottle of wine daily.  No reported sick contacts.  On spironolactone in setting of chronic HFrEF.  Reports very minimal fluid and solid intake over the past 3 to 4 days.  Has had worsening malaise and weakness.  No focal hemiparesis. No reported slurred speech.  Mild confusion. Presented to the ER afebrile, hemodynamically stable.  Satting well on room air.  White count 5.5, hemoglobin 15.6, platelets 144, creatinine 1.16, sodium 107.  Chest x-ray, CT head grossly stable.  CT abdomen pelvis pending. Review of Systems: As mentioned in the history of present illness. All other systems reviewed and are negative.    Assessment and  Plan:  Presumed adrenal insufficiency Hyponatremia Acute metabolic encephalopathy Status post partial adrenalectomy History of left nephrectomy for renal cell cancer Patient presented to the emergency room from the urgent care center for evaluation of weakness, nausea, poor oral intake, generalized weakness and fatigue for 2 days prior to admission. He was normotensive upon arrival but found to have a serum sodium of 107, potassium levels were normal. Patient was started on 3% sodium and nephrology was consulted Due to his history of prior nephrectomy and partial adrenalectomy, there was a concern for possible adrenal insufficiency. Hyponatremia may also be related to spironolactone use as well as fluoxetine Patient will be referred to endocrinology for further testing and follow-up Serum sodium levels have improved and he will be discharged home on sodium chloride tablets    Chronic systolic heart failure Last known LVEF of 40 to 45% from a 2D echocardiogram which was done 07/23 Continue Entresto, metoprolol, Jardiance and spironolactone if blood pressure tolerate    GERD Continue PPI   Alcohol dependence Admits to daily alcohol use but denies having symptoms of alcohol withdrawal when he does not drink Continue thiamine, folic acid and MVI on discharge      Consultants: Nephrology Procedures performed: 3% sodium Disposition: Home Diet recommendation:  Discharge Diet Orders (From admission, onward)     Start     Ordered   03/02/24 0000  Diet - low sodium heart healthy        03/02/24 1209           Cardiac diet DISCHARGE MEDICATION: Allergies as of 03/02/2024   No Known Allergies  Medication List     STOP taking these medications    ADVIL PO   Humira (2 Pen) 40 MG/0.4ML pen Generic drug: adalimumab       TAKE these medications    CertaVite/Antioxidants Tabs Take 1 tablet by mouth daily. Start taking on: March 03, 2024   docusate sodium  100 MG capsule Commonly known as: COLACE Take 1 capsule (100 mg total) by mouth 2 (two) times daily.   empagliflozin 10 MG Tabs tablet Commonly known as: Jardiance Take 1 tablet (10 mg total) by mouth daily before breakfast.   Entresto 24-26 MG Generic drug: sacubitril-valsartan Take 1 tablet by mouth 2 (two) times daily.   fludrocortisone 0.1 MG tablet Commonly known as: FLORINEF Take 1 tablet (0.1 mg total) by mouth daily.   FLUoxetine 20 MG capsule Commonly known as: PROZAC Take 20 mg by mouth daily.   folic acid 1 MG tablet Commonly known as: FOLVITE Take 1 tablet (1 mg total) by mouth daily. Start taking on: March 03, 2024   hydrocortisone 10 MG tablet Commonly known as: CORTEF Take 1 tablet (10 mg total) by mouth 3 (three) times daily.   metoprolol succinate 100 MG 24 hr tablet Commonly known as: TOPROL-XL TAKE 1 TABLET BY MOUTH ONCE A DAY. TAKE WITH OR IMMEDIATELY FOLLOWING A MEAL   ondansetron 4 MG disintegrating tablet Commonly known as: ZOFRAN-ODT Take 1 tablet (4 mg total) by mouth every 8 (eight) hours as needed for nausea or vomiting.   pantoprazole 40 MG tablet Commonly known as: PROTONIX Take 1 tablet (40 mg total) by mouth daily.   SKYRIZI PEN [place] Inject 180 mg into the skin every 3 (three) months.   sodium chloride 1 g tablet Take 1 tablet (1 g total) by mouth 2 (two) times daily with a meal for 14 days.   spironolactone 25 MG tablet Commonly known as: ALDACTONE TAKE ONE HALF TABLET (12.5MG ) BY MOUTH DAILY   thiamine 100 MG tablet Commonly known as: VITAMIN B1 Take 1 tablet (100 mg total) by mouth daily. Start taking on: March 03, 2024        Follow-up Information     Nida, Denman George, MD Follow up in 1 month(s).   Specialty: Endocrinology Contact information: [address] [place] [address] [phone]                Discharge Exam: Ceasar Mons Weights   02/29/24 1205  Weight: 89.4 kg   Constitutional:       Appearance: He is normal weight.  HENT:     Head: Normocephalic and atraumatic.     Nose: Nose normal.     Mouth/Throat:     Mouth: Mucous membranes are moist.  Eyes:     Pupils: Pupils are equal, round, and reactive to light.  Cardiovascular:     Rate and Rhythm: Normal rate and regular rhythm.  Pulmonary:     Effort: Pulmonary effort is normal.  Abdominal:     General: Bowel sounds are normal.  Musculoskeletal:        General: Normal range of motion.  Skin:    General: Skin is dry.  Neurological:     General: No focal deficit present.  Psychiatric:        Mood and Affect: Mood normal.       Condition at discharge: stable  The results of significant diagnostics from this hospitalization (including imaging, microbiology, ancillary and laboratory) are listed below for reference.   Imaging Studies: CT  CHEST ABDOMEN PELVIS W CONTRAST Result Date: 02/29/2024 CLINICAL DATA:  Weakness and shortness of breath. History of renal cell carcinoma. EXAM: CT CHEST, ABDOMEN, AND PELVIS WITH CONTRAST TECHNIQUE: Multidetector CT imaging of the chest, abdomen and pelvis was performed following the standard protocol during bolus administration of intravenous contrast. RADIATION DOSE REDUCTION: This exam was performed according to the departmental dose-optimization program which includes automated exposure control, adjustment of the mA and/or kV according to patient size and/or use of iterative reconstruction technique. CONTRAST:  OMNIPAQUE IOHEXOL 300 MG/ML  SOLN COMPARISON:  CT abdomen/pelvis dated 01/24/2024. CTA chest dated 07/15/2023. FINDINGS: CT CHEST FINDINGS Cardiovascular: Aneurysmal dilatation of the ascending thoracic aorta measuring 4.1 cm in diameter, unchanged. No evidence of central pulmonary embolus. Normal heart size. No pericardial effusion. Mild atherosclerotic calcification of the thoracic aorta. Mediastinum/Nodes: No pathologically enlarged mediastinal, hilar, or axillary lymph  nodes. Thyroid gland, trachea, and esophagus demonstrate no significant findings. Lungs/Pleura: Upper lobe predominant centrilobular and paraseptal emphysema. Diffuse bronchial wall thickening. Stable 6 mm nodular density in the right upper lobe and 4 mm right middle lobe nodule. No new suspicious or enlarging pulmonary nodule or mass. No focal consolidation, pleural effusion, or pneumothorax. Musculoskeletal: No acute osseous abnormality. No suspicious osseous lesion. CT ABDOMEN PELVIS FINDINGS Hepatobiliary: Stable scattered subcentimeter hypodensities are too small to definitively characterize. Gallbladder is unremarkable. No biliary dilatation. 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 No pancreatic ductal dilation or inflammatory changes. Spleen: No splenomegaly. Adrenals/Urinary Tract: Status post left nephrectomy and adrenalectomy. No abnormal soft tissue or fluid collection in the surgical bed. Stable low density collection in the resection bed of the right adrenal nodule. Similar 5 mm subtle nodularity in the right adrenal gland (series 2, image 51). No suspicious right renal lesion. No right-sided urolithiasis or hydronephrosis. Bladder is unremarkable. Stomach/Bowel: Stomach is within normal limits. Appendix appears normal. No evidence of bowel wall thickening, distention, or inflammatory changes. Colonic diverticulosis without evidence of acute diverticulitis. Vascular/Lymphatic: The abdominal aorta is normal in caliber with mild atherosclerotic calcification. No pathologically enlarged abdominopelvic lymph nodes. Reproductive: Prostate is unremarkable. Other: Abdominopelvic ascites.  No intraperitoneal free air. Musculoskeletal: No suspicious osseous lesion. No acute osseous abnormality. Left total hip arthroplasty with associated streak artifact. Multilevel degenerative changes of the spine. IMPRESSION: 1. No acute localizing findings in the  abdomen or pelvis. 2. 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 Recommend annual imaging followup by CTA or MRA. This recommendation follows 2010 ACCF/AHA/AATS/ACR/ASA/SCA/SCAI/SIR/STS/SVM Guidelines for the Diagnosis and Management of Patients with Thoracic Aortic Disease. Circulation. 2010; 121: O536-U440. Aortic aneurysm NOS (ICD10-I71.9) 3. Stable 6 mm nodule in the right upper lobe and 4 mm nodule in the right middle lobe. No new suspicious or enlarging pulmonary nodule or mass. 4. Prior left nephrectomy and adrenalectomy. No evidence to suggest recurrence in the surgical bed. 5. Similar subtle nodularity in the right adrenal gland measuring approximately 5 mm is nonspecific. Recommend continued attention on short-term interval follow-up imaging. 6. 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 Recommend continued attention on follow-up imaging. 7. Additional unchanged chronic findings, as described above. Aortic Atherosclerosis (ICD10-I70.0). Electronically Signed   By: Hart Robinsons M.D.   On: 02/29/2024 16:44   CT HEAD WO CONTRAST Result Date: 02/29/2024 CLINICAL DATA:  Neurologic deficit. EXAM: CT HEAD WITHOUT CONTRAST TECHNIQUE: Contiguous axial images were obtained from the base of the  skull through the vertex without intravenous contrast. RADIATION DOSE REDUCTION: This exam was performed according to the departmental dose-optimization program which includes automated exposure control, adjustment of the mA and/or kV according to patient size and/or use of iterative reconstruction technique. COMPARISON:  None Available. FINDINGS: Brain: The ventricles and sulci are appropriate size for the patient's age. The gray-white matter discrimination is preserved. There is no acute intracranial hemorrhage. No mass effect or midline shift. No extra-axial fluid collection. Vascular: No hyperdense vessel or unexpected calcification.  Skull: Normal. Negative for fracture or focal lesion. Sinuses/Orbits: No acute finding. Other: None IMPRESSION: No acute intracranial pathology. Electronically Signed   By: Elgie Collard M.D.   On: 02/29/2024 14:52   DG Chest 2 View Result Date: 02/29/2024 CLINICAL DATA:  Shortness of breath and weakness for the past 2 days. Slurred speech and dizziness. Nausea. EXAM: CHEST - 2 VIEW COMPARISON:  06/19/2020 FINDINGS: Normal sized heart. Tortuous aorta. Clear lungs with normal vascularity. Midthoracic spine degenerative changes. IMPRESSION: No acute abnormality. Electronically Signed   By: Beckie Salts M.D.   On: 02/29/2024 14:22    Microbiology: Results for orders placed or performed during the hospital encounter of 08/23/20  SARS CORONAVIRUS 2 (TAT 6-24 HRS) Nasopharyngeal Nasopharyngeal Swab     Status: None   Collection Time: 08/23/20  2:33 PM   Specimen: Nasopharyngeal Swab  Result Value Ref Range Status   SARS Coronavirus 2 NEGATIVE NEGATIVE Final    Comment: (NOTE) SARS-CoV-2 target nucleic acids are NOT DETECTED.  The SARS-CoV-2 RNA is generally detectable in upper and lower respiratory specimens during the acute phase of infection. Negative results do not preclude SARS-CoV-2 infection, do not rule out co-infections with other pathogens, and should not be used as the sole basis for treatment or other patient management decisions. Negative results must be combined with clinical observations, patient history, and epidemiological information. The expected result is Negative.  Fact Sheet for Patients: HairSlick.no  Fact Sheet for Healthcare Providers: quierodirigir.com  This test is not yet approved or cleared by the [place] FDA and  has been authorized for detection and/or diagnosis of SARS-CoV-2 by FDA under an Emergency Use Authorization (EUA). This EUA will remain  in effect (meaning this test can be used) for the  duration of the COVID-19 declaration under Se ction 564(b)(1) of the Act, 21 U.S.C. section 360bbb-3(b)(1), unless the authorization is terminated or revoked sooner.  Performed at [facility], [address] [place], [place] [address]     Labs: CBC: Recent Labs  Lab 02/29/24 1205 03/02/24 0931  WBC 5.5 3.6*  HGB 15.6 12.7*  HCT RESULTS UNAVAILABLE DUE TO INTERFERING SUBSTANCE 34.1*  MCV RESULTS UNAVAILABLE DUE TO INTERFERING SUBSTANCE 96.3  PLT 144* 141*   Basic Metabolic Panel: Recent Labs  Lab 02/29/24 1205 02/29/24 1509 03/01/24 0323 03/01/24 0611 03/02/24 0033 03/02/24 0234 03/02/24 0421 03/02/24 0837 03/02/24 1108  NA 107*   < > 112*   < > 123* 124* 125* 127* 128*  K 4.5  --  4.0  --   --   --   --   --  4.0  CL 79*  --  84*  --   --   --   --   --  97*  CO2 19*  --  19*  --   --   --   --   --  24  GLUCOSE 113*  --  112*  --   --   --   --   --  158*  BUN 16  --  16  --   --   --   --   --  13  CREATININE 1.16  --  1.12  --   --   --   --   --  1.13  CALCIUM 8.7*  --  8.6*  --   --   --   --   --  8.7*   < > = values in this interval not displayed.   Liver Function Tests: Recent Labs  Lab 03/01/24 0323  AST 19  ALT 13  ALKPHOS 67  BILITOT 0.8  PROT 6.2*  ALBUMIN 3.5   CBG: No results for input(s): "GLUCAP" in the last 168 hours.  Discharge time spent: greater than 30 minutes.  Signed: Lucile Shutters, MD [facility] 03/02/2024

## 2024-03-02 NOTE — Progress Notes (Signed)
 [facility]  ROUNDING NOTE   Subjective:   Patient seen laying in bed Appetite appropriate Denies dizziness or weakness  Sodium 127  Objective:  Vital signs in last 24 hours:  Temp:  [97.2 F (36.2 C)-98.2 F (36.8 C)] 97.2 F (36.2 C) (04/11 0911) Pulse Rate:  [61-115] 114 (04/11 1249) Resp:  [13-20] 20 (04/11 1249) BP: (91-128)/(65-95) 120/78 (04/11 1249) SpO2:  [93 %-100 %] 98 % (04/11 1249)  Weight change:  Filed Weights   02/29/24 1205  Weight: 89.4 kg    Intake/Output: I/O last 3 completed shifts: In: 610.9 [I.V.:610.9] Out: 400 [Urine:400]   Intake/Output this shift:  No intake/output data recorded.  Physical Exam: General: NAD  Head: Normocephalic, atraumatic. Moist oral mucosal membranes  Eyes: Anicteric  Lungs:  Clear to auscultation, normal effort  Heart: Regular rate and rhythm  Abdomen:  Soft, nontender  Extremities:  no peripheral edema.  Neurologic: Alert, moving all four extremities  Skin: No lesions  Access: None    Basic Metabolic Panel: Recent Labs  Lab 02/29/24 1205 02/29/24 1509 03/01/24 0323 03/01/24 6962 03/02/24 0033 03/02/24 0234 03/02/24 0421 03/02/24 0837 03/02/24 1108  NA 107*   < > 112*   < > 123* 124* 125* 127* 128*  K 4.5  --  4.0  --   --   --   --   --  4.0  CL 79*  --  84*  --   --   --   --   --  97*  CO2 19*  --  19*  --   --   --   --   --  24  GLUCOSE 113*  --  112*  --   --   --   --   --  158*  BUN 16  --  16  --   --   --   --   --  13  CREATININE 1.16  --  1.12  --   --   --   --   --  1.13  CALCIUM 8.7*  --  8.6*  --   --   --   --   --  8.7*   < > = values in this interval not displayed.    Liver Function Tests: Recent Labs  Lab 03/01/24 0323  AST 19  ALT 13  ALKPHOS 67  BILITOT 0.8  PROT 6.2*  ALBUMIN 3.5   No results for input(s): "LIPASE", "AMYLASE" in the last 168 hours. No results for input(s): "AMMONIA" in the last 168 hours.  CBC: Recent Labs  Lab 02/29/24 1205  03/02/24 0931  WBC 5.5 3.6*  HGB 15.6 12.7*  HCT RESULTS UNAVAILABLE DUE TO INTERFERING SUBSTANCE 34.1*  MCV RESULTS UNAVAILABLE DUE TO INTERFERING SUBSTANCE 96.3  PLT 144* 141*    Cardiac Enzymes: No results for input(s): "CKTOTAL", "CKMB", "CKMBINDEX", "TROPONINI" in the last 168 hours.  BNP: Invalid input(s): "POCBNP"  CBG: No results for input(s): "GLUCAP" in the last 168 hours.  Microbiology: Results for orders placed or performed during the hospital encounter of 08/23/20  SARS CORONAVIRUS 2 (TAT 6-24 HRS) Nasopharyngeal Nasopharyngeal Swab     Status: None   Collection Time: 08/23/20  2:33 PM   Specimen: Nasopharyngeal Swab  Result Value Ref Range Status   SARS Coronavirus 2 NEGATIVE NEGATIVE Final    Comment: (NOTE) SARS-CoV-2 target nucleic acids are NOT DETECTED.  The SARS-CoV-2 RNA is generally detectable in upper and lower respiratory specimens during  the acute phase of infection. Negative results do not preclude SARS-CoV-2 infection, do not rule out co-infections with other pathogens, and should not be used as the sole basis for treatment or other patient management decisions. Negative results must be combined with clinical observations, patient history, and epidemiological information. The expected result is Negative.  Fact Sheet for Patients: HairSlick.no  Fact Sheet for Healthcare Providers: quierodirigir.com  This test is not yet approved or cleared by the [place] FDA and  has been authorized for detection and/or diagnosis of SARS-CoV-2 by FDA under an Emergency Use Authorization (EUA). This EUA will remain  in effect (meaning this test can be used) for the duration of the COVID-19 declaration under Se ction 564(b)(1) of the Act, 21 U.S.C. section 360bbb-3(b)(1), unless the authorization is terminated or revoked sooner.  Performed at [facility], [address], [place],  [place] [address]     Coagulation Studies: No results for input(s): "LABPROT", "INR" in the last 72 hours.  Urinalysis: Recent Labs    02/29/24 1511  COLORURINE YELLOW*  LABSPEC 1.023  PHURINE 5.0  GLUCOSEU >=500*  HGBUR NEGATIVE  BILIRUBINUR NEGATIVE  KETONESUR 20*  PROTEINUR NEGATIVE  NITRITE NEGATIVE  LEUKOCYTESUR NEGATIVE      Imaging: CT CHEST ABDOMEN PELVIS W CONTRAST Result Date: 02/29/2024 CLINICAL DATA:  Weakness and shortness of breath. History of renal cell carcinoma. EXAM: CT CHEST, ABDOMEN, AND PELVIS WITH CONTRAST TECHNIQUE: Multidetector CT imaging of the chest, abdomen and pelvis was performed following the standard protocol during bolus administration of intravenous contrast. RADIATION DOSE REDUCTION: This exam was performed according to the departmental dose-optimization program which includes automated exposure control, adjustment of the mA and/or kV according to patient size and/or use of iterative reconstruction technique. CONTRAST:  OMNIPAQUE IOHEXOL 300 MG/ML  SOLN COMPARISON:  CT abdomen/pelvis dated 01/24/2024. CTA chest dated 07/15/2023. FINDINGS: CT CHEST FINDINGS Cardiovascular: Aneurysmal dilatation of the ascending thoracic aorta measuring 4.1 cm in diameter, unchanged. No evidence of central pulmonary embolus. Normal heart size. No pericardial effusion. Mild atherosclerotic calcification of the thoracic aorta. Mediastinum/Nodes: No pathologically enlarged mediastinal, hilar, or axillary lymph nodes. Thyroid gland, trachea, and esophagus demonstrate no significant findings. Lungs/Pleura: Upper lobe predominant centrilobular and paraseptal emphysema. Diffuse bronchial wall thickening. Stable 6 mm nodular density in the right upper lobe and 4 mm right middle lobe nodule. No new suspicious or enlarging pulmonary nodule or mass. No focal consolidation, pleural effusion, or pneumothorax. Musculoskeletal: No acute osseous abnormality. No suspicious osseous lesion. CT  ABDOMEN PELVIS FINDINGS Hepatobiliary: Stable scattered subcentimeter hypodensities are too small to definitively characterize. Gallbladder is unremarkable. No biliary dilatation. 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 No pancreatic ductal dilation or inflammatory changes. Spleen: No splenomegaly. Adrenals/Urinary Tract: Status post left nephrectomy and adrenalectomy. No abnormal soft tissue or fluid collection in the surgical bed. Stable low density collection in the resection bed of the right adrenal nodule. Similar 5 mm subtle nodularity in the right adrenal gland (series 2, image 51). No suspicious right renal lesion. No right-sided urolithiasis or hydronephrosis. Bladder is unremarkable. Stomach/Bowel: Stomach is within normal limits. Appendix appears normal. No evidence of bowel wall thickening, distention, or inflammatory changes. Colonic diverticulosis without evidence of acute diverticulitis. Vascular/Lymphatic: The abdominal aorta is normal in caliber with mild atherosclerotic calcification. No pathologically enlarged abdominopelvic lymph nodes. Reproductive: Prostate is unremarkable. Other: Abdominopelvic ascites.  No intraperitoneal free air. Musculoskeletal: No suspicious osseous lesion. No  acute osseous abnormality. Left total hip arthroplasty with associated streak artifact. Multilevel degenerative changes of the spine. IMPRESSION: 1. No acute localizing findings in the abdomen or pelvis. 2. 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 Recommend annual imaging followup by CTA or MRA. This recommendation follows 2010 ACCF/AHA/AATS/ACR/ASA/SCA/SCAI/SIR/STS/SVM Guidelines for the Diagnosis and Management of Patients with Thoracic Aortic Disease. Circulation. 2010; 121: Z610-R604. Aortic aneurysm NOS (ICD10-I71.9) 3. Stable 6 mm nodule in the right upper lobe and 4 mm nodule in the right middle lobe. No new  suspicious or enlarging pulmonary nodule or mass. 4. Prior left nephrectomy and adrenalectomy. No evidence to suggest recurrence in the surgical bed. 5. Similar subtle nodularity in the right adrenal gland measuring approximately 5 mm is nonspecific. Recommend continued attention on short-term interval follow-up imaging. 6. 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 Recommend continued attention on follow-up imaging. 7. Additional unchanged chronic findings, as described above. Aortic Atherosclerosis (ICD10-I70.0). Electronically Signed   By: Hart Robinsons M.D.   On: 02/29/2024 16:44     Medications:     enoxaparin (LOVENOX) injection  40 mg Subcutaneous Q24H   folic acid  1 mg Oral Daily   hydrocortisone sod succinate (SOLU-CORTEF) inj  100 mg Intravenous Q12H   multivitamin with minerals  1 tablet Oral Daily   sodium chloride  1 g Oral BID WC   thiamine  100 mg Oral Daily   Or   thiamine  100 mg Intravenous Daily   acetaminophen, LORazepam **OR** LORazepam, ondansetron **OR** ondansetron (ZOFRAN) IV  Assessment/ Plan:  Mr. Randy Walker is a 68 y.o.  male with past medical conditions including GERD, heart failure, kidney cancer status post nephrectomy, alcohol abuse, renal mass status post partial adrenalectomy, psoriasis, tobacco abuse, who was admitted to [facility] on 02/29/2024 for Hyponatremia [E87.1]   Hyponatremia likely secondary to decreased solute intake with some adrenal insufficiency.   Sodium 107 on admission.  Has received hypertonic saline during this admission. Sodium 128. Recommend 1g salt tabs twice daily, Hydrocortisone 10mg  three times dialy, and Florinef 0.1mg  daily. Recommend Endocrine followup and establishment with PCP.     LOS: 1 Sherika Kubicki 4/11/20251:30 PM

## 2024-03-02 NOTE — Progress Notes (Addendum)
 PHARMACY CONSULT NOTE - Hypertonic Saline  Pharmacy Consult for Hypertonic Saline Monitoring and Management  Recent Labs: Potassium (mmol/L)  Date Value  03/01/2024 4.0   Calcium (mg/dL)  Date Value  16/08/9603 8.6 (L)   Albumin (g/dL)  Date Value  54/07/8118 3.5  10/06/2021 4.4   Sodium (mmol/L)  Date Value  03/02/2024 123 (L)  05/26/2022 133 (L)    Assessment  Randy Walker is a 68 y.o. male presenting with SOB and weakness. 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 Pharmacy has been consulted to monitor hypertonic saline (3%) infusion.  Pertinent medications: None  Baseline Labs: Serum Na 107, Serum Osm -, Urine Na -, Urine Osm -  Goal of Therapy:  Increase in Na by 4-6 mEq/L in 4-6 hours Do not exceed increase in Na by 8-10 mEq/L in 24 hours  Monitoring:  Date Time Na Rate/Comment  4/9 1205 107 Baseline 4/9 1409 - Na 3% Infusion started at 30 mL/hr 4/9 1509 109 Increased 2 mEq/L in ~2 hours 4/9 1825 112 Increased 5 mEq/L in ~6 hours 4/9       2046    110     Increased 3 mEq/L in ~ 8 hours 4/9       2217    111     4/10     0029    113     4/10     0323    112     3%NaCl increased to 40 ml/hr@0454  4/10 0611 113  4/10 0819 118 Holding 3% NaCl  4/10 1010 117  4/10 1242 117 Continuing to hold 3% NaCl will reassess at next labs 4/10 1637  Restarted 3% NaCl at 30 mL/hr 4/10     1759    121 4/10     2011    120 4/10     2226    122  4/11     0033    123  4/11     0234    124  4/11     0421    125  Plan:  Restarted 3% NaCl @ 1637 decreased to 30 mL/hr Checking Na Q2H, consider changing to Q4H if Na is replacing at a slower rate Stop infusion if  Na increases > 4 mEq/L in first 2 hours Na increases > 6 mEq/L in first 4 hours Na increases > 6 mEq/L in 6 hours  Na increases > 8 mEq/L in 8 hours (give D5W bolus) Continue to monitor for signs of clinical improvement and  recommendations per nephrology  Thank you for allowing pharmacy to participate in this patient's care.  Madysen Faircloth D, PharmD 03/02/2024 1:14 AM

## 2024-03-02 NOTE — ED Notes (Signed)
 Spoke with the pt daughter Rawley Harju and updated regarding pt condition

## 2024-03-05 ENCOUNTER — Telehealth: Payer: Self-pay | Admitting: *Deleted

## 2024-03-05 NOTE — Transitions of Care (Post Inpatient/ED Visit) (Signed)
 03/05/2024  Name: Randy Walker MRN: [id number] DOB: [birth year]  Today's TOC FU Call Status: Today's TOC FU Call Status:: Successful TOC FU Call Completed TOC FU Call Complete Date: 03/05/24 Patient's Name and Date of Birth confirmed.  Transition Care Management Follow-up Telephone Call Date of Discharge: 03/02/24 Discharge Facility: [facility]) Type of Discharge: Inpatient Admission Primary Inpatient Discharge Diagnosis:: Hyponatremia; weakness; nausea/ vomiting How have you been since you were released from the hospital?: Better ("I am okay but I sure was sick; I am completely independent except for my medicine- my wife handles that.  I have not seen Dr. Autry Legions, or any doctor in a long time.  I play golf 2-3 times a week.  I do not need need any one calling me on a regular basis") Any questions or concerns?: No  Items Reviewed: Did you receive and understand the discharge instructions provided?: Yes (thoroughly reviewed with patient who verbalizes good understanding of same) Medications obtained,verified, and reconciled?: No (Patient adamantly declined medication review- states No way I'm going through all of them today;" confirms no questions or concerns around meds - confirms obtained and is taking all newly prescribed medications post-hospital discharge) Medications Not Reviewed Reasons:: Other: (Patient adamantly declined medication review- states No way I'm going through all of them today;" confirms no questions or concerns around meds - confirms obtained and is taking all newly prescribed medications post-hospital discharge) Any new allergies since your discharge?: No Dietary orders reviewed?: No (patient declined) Do you have support at home?: Yes People in Home [RPT]: spouse Name of Support/Comfort Primary Source: Reports independent in self-care activities; supportive spouse assists as/ if needed/ indicated  Medications Reviewed Today: Medications  Reviewed Today     Reviewed by Runell Kovich M, RN (Registered Nurse) on 03/05/24 at 1039  Med List Status: <None>   Medication Order Taking? Sig Documenting Provider Last Dose Status Informant  docusate sodium (COLACE) 100 MG capsule [id number] No Take 1 capsule (100 mg total) by mouth 2 (two) times daily.  Patient not taking: Reported on 05/05/2023   Carrolyn Clan, PA-C Not Taking Active Spouse/Significant Other, Pharmacy Records  empagliflozin (JARDIANCE) 10 MG TABS tablet [id number] No Take 1 tablet (10 mg total) by mouth daily before breakfast. Gennaro Khat, Cadence H, PA-C Past Week Active Spouse/Significant Other, Pharmacy Records  fludrocortisone (FLORINEF) 0.1 MG tablet [id number]  Take 1 tablet (0.1 mg total) by mouth daily. Read Camel, MD  Active   FLUoxetine (PROZAC) 20 MG capsule [id number] No Take 20 mg by mouth daily. [provider] Past Week Active Spouse/Significant Other, Pharmacy Records  folic acid (FOLVITE) 1 MG tablet [id number]  Take 1 tablet (1 mg total) by mouth daily. Read Camel, MD  Active   hydrocortisone (CORTEF) 10 MG tablet [id number]  Take 1 tablet (10 mg total) by mouth 3 (three) times daily. Read Camel, MD  Active   metoprolol succinate (TOPROL-XL) 100 MG 24 hr tablet [id number] No TAKE 1 TABLET BY MOUTH ONCE A DAY. TAKE WITH OR IMMEDIATELY FOLLOWING A MEAL Furth, Cadence H, PA-C Past Week Active Spouse/Significant Other, Pharmacy Records  Multiple Vitamin (MULTIVITAMIN WITH MINERALS) TABS tablet [id number]  Take 1 tablet by mouth daily. Read Camel, MD  Active   ondansetron (ZOFRAN-ODT) 4 MG disintegrating tablet [id number]  Take 1 tablet (4 mg total) by mouth every 8 (eight) hours as needed for nausea or vomiting. Collis Deaner, MD  Active   pantoprazole (PROTONIX) 40 MG tablet [id number] No Take 1 tablet (  40 mg total) by mouth daily. Gennaro Khat, Cadence H, PA-C Past Week Active Spouse/Significant Other, Pharmacy Records  Risankizumab-rzaa [facility] PEN  [place]) [id number] No Inject 180 mg into the skin every 3 (three) months. [provider] 01/18/2024 Active Spouse/Significant Other, Pharmacy Records  sacubitril-valsartan (ENTRESTO) 24-26 MG [id number] No Take 1 tablet by mouth 2 (two) times daily. Gennaro Khat, Cadence H, PA-C Past Week Active Spouse/Significant Other, Pharmacy Records  sodium chloride 1 g tablet [id number]  Take 1 tablet (1 g total) by mouth 2 (two) times daily with a meal for 14 days. Read Camel, MD  Active   spironolactone (ALDACTONE) 25 MG tablet [id number] No TAKE ONE HALF TABLET (12.5MG ) BY MOUTH DAILY Furth, Cadence H, PA-C Past Week Active Spouse/Significant Other, Pharmacy Records  thiamine (VITAMIN B-1) 100 MG tablet [id number]  Take 1 tablet (100 mg total) by mouth daily. Read Camel, MD  Active            Home Care and Equipment/Supplies: Were Home Health Services Ordered?: No Any new equipment or medical supplies ordered?: No  Functional Questionnaire: Do you need assistance with bathing/showering or dressing?: No Do you need assistance with meal preparation?: No Do you need assistance with eating?: No Do you have difficulty maintaining continence: No Do you need assistance with getting out of bed/getting out of a chair/moving?: No Do you have difficulty managing or taking your medications?: Yes (spouse manages medications)  Follow up appointments reviewed: PCP Follow-up appointment confirmed?: Yes Date of PCP follow-up appointment?: 03/06/24 Follow-up Provider: PCP Specialist Hospital Follow-up appointment confirmed?: NA (verified not indicated per hospital discharging provider discharge notes) Do you need transportation to your follow-up appointment?: No Do you understand care options if your condition(s) worsen?: Yes-patient verbalized understanding  SDOH Interventions Today    Flowsheet Row Most Recent Value  SDOH Interventions   Food Insecurity Interventions Intervention Not Indicated   Housing Interventions Intervention Not Indicated  Transportation Interventions Intervention Not Indicated  [drives self,  spouse assists as/ if indicated]  Utilities Interventions Intervention Not Indicated      Purpose of/ and importance of having updated DPR completed provided education around process of updating DPR encouraged her to update at time of upcoming PCP appointment on   Total time spent from review to signing of note/ including any care coordination interventions:  35 minutes  Pls call/ message for questions,  Shravya Wickwire Mckinney [place] Mennen, RN, BSN, Media planner  Transitions of Care  [facility] - [facility] [phone]: direct office

## 2024-03-06 ENCOUNTER — Other Ambulatory Visit (HOSPITAL_COMMUNITY): Payer: Self-pay

## 2024-03-06 ENCOUNTER — Encounter: Payer: Self-pay | Admitting: Internal Medicine

## 2024-03-06 ENCOUNTER — Telehealth: Payer: Self-pay | Admitting: Pharmacy Technician

## 2024-03-06 ENCOUNTER — Ambulatory Visit (INDEPENDENT_AMBULATORY_CARE_PROVIDER_SITE_OTHER): Admitting: Internal Medicine

## 2024-03-06 VITALS — BP 120/88 | HR 65 | Temp 98.6°F | Ht 74.0 in | Wt 193.0 lb

## 2024-03-06 DIAGNOSIS — I10 Essential (primary) hypertension: Secondary | ICD-10-CM

## 2024-03-06 DIAGNOSIS — R739 Hyperglycemia, unspecified: Secondary | ICD-10-CM | POA: Diagnosis not present

## 2024-03-06 DIAGNOSIS — E785 Hyperlipidemia, unspecified: Secondary | ICD-10-CM

## 2024-03-06 DIAGNOSIS — E271 Primary adrenocortical insufficiency: Secondary | ICD-10-CM | POA: Diagnosis not present

## 2024-03-06 DIAGNOSIS — Z0000 Encounter for general adult medical examination without abnormal findings: Secondary | ICD-10-CM | POA: Diagnosis not present

## 2024-03-06 DIAGNOSIS — Z0001 Encounter for general adult medical examination with abnormal findings: Secondary | ICD-10-CM

## 2024-03-06 DIAGNOSIS — F17200 Nicotine dependence, unspecified, uncomplicated: Secondary | ICD-10-CM

## 2024-03-06 DIAGNOSIS — I5022 Chronic systolic (congestive) heart failure: Secondary | ICD-10-CM

## 2024-03-06 LAB — BASIC METABOLIC PANEL WITH GFR
BUN: 10 mg/dL (ref 6–23)
CO2: 31 meq/L (ref 19–32)
Calcium: 9.7 mg/dL (ref 8.4–10.5)
Chloride: 98 meq/L (ref 96–112)
Creatinine, Ser: 1.12 mg/dL (ref 0.40–1.50)
GFR: 68.31 mL/min (ref >60.00)
Glucose, Bld: 94 mg/dL (ref 70–99)
Potassium: 4.9 meq/L (ref 3.5–5.1)
Sodium: 135 meq/L (ref 135–145)

## 2024-03-06 LAB — HEMOGLOBIN A1C: Hgb A1c MFr Bld: 5.5 % (ref 4.6–6.5)

## 2024-03-06 MED ORDER — REPATHA SURECLICK 140 MG/ML ~~LOC~~ SOAJ: 140.0000 mg | SUBCUTANEOUS | 3 refills | Status: DC

## 2024-03-06 NOTE — Telephone Encounter (Signed)
 Pharmacy Patient Advocate Encounter   Received notification from CoverMyMeds that prior authorization for Repatha SureClick 140MG /ML auto-injectors is required/requested.   Insurance verification completed.   The patient is insured through [facility] [employer] [employer] .   Per test claim: PA required; PA submitted to above mentioned insurance via CoverMyMeds Key/confirmation #/EOC [facility] Status is pending

## 2024-03-06 NOTE — Progress Notes (Signed)
 The test results show that your current treatment is OK, as the tests are stable.  Please continue the same plan.  There is no other need for change of treatment or further evaluation based on these results, at this time.  thanks

## 2024-03-06 NOTE — Patient Instructions (Signed)
 Please take all new medication as prescribed - the repatha for high cholesterol and aortic atherosclerosis  Please continue all other medications as before, and refills have been done if requested.  Please have the pharmacy call with any other refills you may need.  Please continue your efforts at being more active, low cholesterol diet, and weight control.  You are otherwise up to date with prevention measures today.  Please keep your appointments with your specialists as you may have planned - Cardiology in [place] as you have planned   You will be contacted regarding the referral for: endocrinology  We will need to plan to f/u the Ascending Aortic Anuerysm in 1 year as well as the pancreas spot

## 2024-03-06 NOTE — Progress Notes (Unsigned)
 Patient ID: Randy Walker, male   DOB: 1956-04-21, 68 y.o.   MRN: [id number]         Chief Complaint:: wellness exam and Medical Management of Chronic Issues [facility] follow up , referral to endo )  ***       HPI:  Randy Walker is a 68 y.o. male here for wellness exam                        Also***   Wt Readings from Last 3 Encounters:  03/06/24 193 lb (87.5 kg)  02/29/24 197 lb 1.5 oz (89.4 kg)  05/05/23 197 lb (89.4 kg)   BP Readings from Last 3 Encounters:  03/06/24 120/88  03/02/24 120/78  05/05/23 (!) 140/78   Immunization History  Administered Date(s) Administered   Moderna Sars-Covid-2 Vaccination 02/02/2020, 03/02/2020, 10/31/2020, 01/03/2021   Tdap 05/23/2019   Health Maintenance Due  Topic Date Due   Medicare Annual Wellness (AWV)  Never done   Pneumonia Vaccine 17+ Years old (1 of 2 - PCV) Never done   Zoster Vaccines- Shingrix (1 of 2) Never done   Colonoscopy  01/28/2013   COVID-19 Vaccine (5 - 2024-25 season) 07/24/2023      Past Medical History:  Diagnosis Date   Adenomatous polyps 11/17/2011   Alcohol use 11/17/2011   ALLERGIC RHINITIS 09/10/2007   ANXIETY 09/10/2007   BACK PAIN 12/31/2010   CHF (congestive heart failure) (HCC)    DISC DISEASE, LUMBAR 12/31/2010   GERD (gastroesophageal reflux disease) 11/23/2016   History of kidney cancer    Hyperglycemia 11/23/2016   HYPERLIPIDEMIA 12/31/2010   Hypertension    Personal history of rectal adenomas 01/29/2008   PSORIASIS 12/31/2010   Psoriatic arthritis (HCC)    PULMONARY EMBOLISM, HX OF 09/10/2007   RASH-NONVESICULAR 04/23/2009   Renal mass    SMOKER 12/31/2010   Past Surgical History:  Procedure Laterality Date   LYMPH NODE DISSECTION Bilateral 08/27/2020   Procedure: RETROPERITONEAL LYMPH NODE DISSECTION;  Surgeon: Osborn Blaze, MD;  Location: [facility];  Service: Urology;  Laterality: Bilateral;   RIGHT/LEFT HEART CATH AND CORONARY ANGIOGRAPHY N/A 12/15/2021   Procedure: RIGHT/LEFT  HEART CATH AND CORONARY ANGIOGRAPHY;  Surgeon: Wenona Hamilton, MD;  Location: [facility] INVASIVE CV LAB;  Service: Cardiovascular;  Laterality: N/A;   ROBOT ASSISTED LAPAROSCOPIC NEPHRECTOMY Left 08/27/2020   Procedure: XI ROBOTIC ASSISTED LAPAROSCOPIC RADICAL NEPHRECTOMY;  Surgeon: Osborn Blaze, MD;  Location: [facility];  Service: Urology;  Laterality: Left;  3.5 HRS   ROBOTIC ADRENALECTOMY Right 07/07/2022   Procedure: XI ROBOTIC PARTIAL ADRENALECTOMY, INTRAOPERATIVE ULTRASOUND;  Surgeon: Osborn Blaze, MD;  Location: [facility];  Service: Urology;  Laterality: Right;   ROTATOR CUFF REPAIR  Jan, 2010   [facility], Left   spleen repair     TOTAL HIP ARTHROPLASTY Left 09/09/2017   Procedure: LEFT TOTAL HIP ARTHROPLASTY ANTERIOR APPROACH;  Surgeon: Neil Balls, MD;  Location: [facility];  Service: Orthopedics;  Laterality: Left;   WISDOM TOOTH EXTRACTION      reports that he has been smoking cigarettes. He has a 10 pack-year smoking history. He has never used smokeless tobacco. He reports current alcohol use of about 21.0 standard drinks of alcohol per week. He reports that he does not use drugs. family history includes Heart attack in his father. No Known Allergies Current Outpatient Medications on File Prior to Visit  Medication Sig Dispense Refill   empagliflozin (JARDIANCE) 10 MG TABS tablet Take  1 tablet (10 mg total) by mouth daily before breakfast. 90 tablet 3   fludrocortisone (FLORINEF) 0.1 MG tablet Take 1 tablet (0.1 mg total) by mouth daily. 30 tablet 0   FLUoxetine (PROZAC) 20 MG capsule Take 20 mg by mouth daily.     folic acid (FOLVITE) 1 MG tablet Take 1 tablet (1 mg total) by mouth daily. 30 tablet 0   hydrocortisone (CORTEF) 10 MG tablet Take 1 tablet (10 mg total) by mouth 3 (three) times daily. 90 tablet 1   metoprolol succinate (TOPROL-XL) 100 MG 24 hr tablet TAKE 1 TABLET BY MOUTH ONCE A DAY. TAKE WITH OR IMMEDIATELY FOLLOWING A MEAL 90 tablet 3   Multiple Vitamin (MULTIVITAMIN WITH  MINERALS) TABS tablet Take 1 tablet by mouth daily. 30 tablet 0   ondansetron (ZOFRAN-ODT) 4 MG disintegrating tablet Take 1 tablet (4 mg total) by mouth every 8 (eight) hours as needed for nausea or vomiting. 20 tablet 0   pantoprazole (PROTONIX) 40 MG tablet Take 1 tablet (40 mg total) by mouth daily. 90 tablet 3   Risankizumab-rzaa (SKYRIZI PEN [place]) Inject 180 mg into the skin every 3 (three) months.     sacubitril-valsartan (ENTRESTO) 24-26 MG Take 1 tablet by mouth 2 (two) times daily. 180 tablet 3   sodium chloride 1 g tablet Take 1 tablet (1 g total) by mouth 2 (two) times daily with a meal for 14 days. 28 tablet 0   spironolactone (ALDACTONE) 25 MG tablet TAKE ONE HALF TABLET (12.5MG ) BY MOUTH DAILY 45 tablet 3   thiamine (VITAMIN B-1) 100 MG tablet Take 1 tablet (100 mg total) by mouth daily. 30 tablet 0   docusate sodium (COLACE) 100 MG capsule Take 1 capsule (100 mg total) by mouth 2 (two) times daily. (Patient not taking: Reported on 05/05/2023)     No current facility-administered medications on file prior to visit.        ROS:  All others reviewed and negative.  Objective        PE:  BP 120/88 (BP Location: Right Arm, Patient Position: Sitting, Cuff Size: Normal)   Pulse 65   Temp 98.6 F (37 C) (Oral)   Ht 6\' 2"  (1.88 m)   Wt 193 lb (87.5 kg)   SpO2 99%   BMI 24.78 kg/m                 Constitutional: Pt appears in NAD               HENT: Head: NCAT.                Right Ear: External ear normal.                 Left Ear: External ear normal.                Eyes: . Pupils are equal, round, and reactive to light. Conjunctivae and EOM are normal               Nose: without d/c or deformity               Neck: Neck supple. Gross normal ROM               Cardiovascular: Normal rate and regular rhythm.                 Pulmonary/Chest: Effort normal and breath sounds without rales or wheezing.  Abd:  Soft, NT, ND, + BS, no organomegaly                Neurological: Pt is alert. At baseline orientation, motor grossly intact               Skin: Skin is warm. No rashes, no other new lesions, LE edema - ***               Psychiatric: Pt behavior is normal without agitation   Micro: none  Cardiac tracings I have personally interpreted today:  none  Pertinent Radiological findings (summarize): none   Lab Results  Component Value Date   WBC 3.6 (L) 03/02/2024   HGB 12.7 (L) 03/02/2024   HCT 34.1 (L) 03/02/2024   PLT 141 (L) 03/02/2024   GLUCOSE 158 (H) 03/02/2024   CHOL 170 10/06/2021   TRIG 127 10/06/2021   HDL 55 10/06/2021   LDLDIRECT 90.0 10/07/2020   LDLCALC 93 10/06/2021   ALT 13 03/01/2024   AST 19 03/01/2024   NA 128 (L) 03/02/2024   K 4.0 03/02/2024   CL 97 (L) 03/02/2024   CREATININE 1.13 03/02/2024   BUN 13 03/02/2024   CO2 24 03/02/2024   TSH 2.310 10/06/2021   PSA 0.33 10/07/2020   INR 0.94 09/05/2017   HGBA1C 5.6 10/07/2020   Assessment/Plan:  ETHANIEL GARFIELD is a 68 y.o. White or Caucasian [1] male with  has a past medical history of Adenomatous polyps (11/17/2011), Alcohol use (11/17/2011), ALLERGIC RHINITIS (09/10/2007), ANXIETY (09/10/2007), BACK PAIN (12/31/2010), CHF (congestive heart failure) (HCC), DISC DISEASE, LUMBAR (12/31/2010), GERD (gastroesophageal reflux disease) (11/23/2016), History of kidney cancer, Hyperglycemia (11/23/2016), HYPERLIPIDEMIA (12/31/2010), Hypertension, Personal history of rectal adenomas (01/29/2008), PSORIASIS (12/31/2010), Psoriatic arthritis (HCC), PULMONARY EMBOLISM, HX OF (09/10/2007), RASH-NONVESICULAR (04/23/2009), Renal mass, and SMOKER (12/31/2010).  No problem-specific Assessment & Plan notes found for this encounter.  Followup: No follow-ups on file.  Rosalia Colonel, MD 03/06/2024 8:48 AM [facility]

## 2024-03-07 NOTE — Telephone Encounter (Signed)
 Pharmacy Patient Advocate Encounter  Received notification from [facility] that Prior Authorization for Repatha SureClick 140MG /ML auto-injectors has been DENIED.  Full denial letter will be uploaded to the media tab. See denial reason below.   PA #/Case ID/Reference #: [id number]

## 2024-03-08 ENCOUNTER — Encounter: Payer: Self-pay | Admitting: Internal Medicine

## 2024-03-08 NOTE — Assessment & Plan Note (Signed)
 BP Readings from Last 3 Encounters:  03/06/24 120/88  03/02/24 120/78  05/05/23 (!) 140/78   Stable, pt to continue medical treatment toprol xl 100 qd

## 2024-03-08 NOTE — Assessment & Plan Note (Signed)
 Lab Results  Component Value Date   LDLCALC 93 10/06/2021   Uncontrolled, declines statin, for repatha 140 mg

## 2024-03-08 NOTE — Assessment & Plan Note (Signed)
 For bmp today, and refer endocrinology, cont hydrocortisone oral

## 2024-03-08 NOTE — Assessment & Plan Note (Signed)
 Stable volume , continue current med tx, wife will call for cardiology f/u appt

## 2024-03-08 NOTE — Assessment & Plan Note (Signed)
 Lab Results  Component Value Date   HGBA1C 5.5 03/06/2024   Stable, pt to continue current medical treatment jardiance 10 qd

## 2024-03-08 NOTE — Assessment & Plan Note (Signed)
 Pt counsled to quit, pt not ready

## 2024-03-08 NOTE — Addendum Note (Signed)
 Addended by: Roslyn Coombe on: 03/08/2024 10:16 PM   Modules accepted: Level of Service

## 2024-03-08 NOTE — Assessment & Plan Note (Signed)
 Age and sex appropriate education and counseling updated with regular exercise and diet Referrals for preventative services - declines colonoscopy Immunizations addressed - for prevnar and shingrix at pharmacy Smoking counseling  - pt counsled to quit, pt not ready Evidence for depression or other mood disorder - none significant Most recent labs reviewed. I have personally reviewed and have noted: 1) the patient's medical and social history 2) The patient's current medications and supplements 3) The patient's height, weight, and BMI have been recorded in the chart

## 2024-03-22 ENCOUNTER — Other Ambulatory Visit (HOSPITAL_COMMUNITY): Payer: Self-pay

## 2024-03-22 MED ORDER — HYDROCORTISONE SOD SUC (PF) 100 MG IJ SOLR
50.0000 mg | INTRAMUSCULAR | 11 refills | Status: AC
Start: 2024-03-21 — End: ?
  Filled 2024-03-22: qty 1, 30d supply, fill #0

## 2024-03-22 MED ORDER — SYRINGE/NEEDLE (DISP) 22G X 1-1/2" 3 ML MISC
11 refills | Status: DC
  Filled 2024-03-22: qty 25, 25d supply, fill #0

## 2024-04-03 ENCOUNTER — Other Ambulatory Visit (HOSPITAL_COMMUNITY): Payer: Self-pay

## 2024-04-18 ENCOUNTER — Ambulatory Visit: Admitting: Emergency Medicine

## 2024-04-18 NOTE — Progress Notes (Deleted)
 Cardiology Office Note:    Date:  04/18/2024  ID:  Randy Walker, DOB 1956-05-31, MRN [id number] PCP: Roslyn Coombe, MD  [facility] Providers Cardiologist:  Antionette Kirks, MD { Click to update primary MD,subspecialty MD or APP then REFRESH:1}    {Click to Open Review  :1}   Patient Profile:       Chief Complaint: *** History of Present Illness:  Randy Walker is a 68 y.o. male with visit-pertinent history of hypertension, hyperlipidemia, HFrEF, NICM, left renal cancer s/p nephrectomy, former smoker, alcohol  use, mild aortic aneurysm  He was referred to cardiology service following CTA on 08/2021 for monitoring of aortic aneurysm.  On the initial visit he had noted to have some sinus tachycardia and reported mild dyspnea on exertion.  He was started on Toprol .  Echocardiogram was ordered but showed LVEF 40 to 45%, grade 2 DD, moderately dilated ascending aorta at 44 mm.  He was scheduled for cardiac catheterization which showed normal coronaries.  Repeat limited echocardiogram 05/2022 showed LVEF 40 to 45%.  He was last seen in office on 05/05/2023.  He was noted to be smoking 3 cigarettes daily as well as drinking 5 alcoholic beverages daily.  No medication changes were made he was to follow-up in 1 year.  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  Most recently patient was admitted to the hospital on 02/29/2024 presenting with encephalopathy and severe hyponatremia.  He presented with roughly 3 days of recurrent nausea and vomiting, minimal abdominal pain, minimal diarrhea and shortness of breath.  Noted he drinks roughly 1 bottle of wine daily.  He reported very minimal fluid and solid intake over the past 3 to 4 days.  He had a serum sodium of 107.  He was started on 3% sodium and nephrology was consulted.  Due to his history of prior nephrectomy and partial adrenalectomy there was concern for possible adrenal  insufficiency.  Serum sodium levels have improved and he was discharged on sodium chloride  tablets.  He was referred to endocrinology for further testing and follow-up.  Discussed the use of AI scribe software for clinical note transcription with the patient, who gave verbal consent to proceed.  History of Present Illness     Review of systems:  Please see the history of present illness. All other systems are reviewed and otherwise negative. ***      Studies Reviewed:        ***  Risk Assessment/Calculations:   {Does this patient have ATRIAL FIBRILLATION?:[phone]} No BP recorded.  {Refresh Note OR Click here to enter BP  :1}***        Physical Exam:   VS:  There were no vitals taken for this visit.   Wt Readings from Last 3 Encounters:  03/06/24 193 lb (87.5 kg)  02/29/24 197 lb 1.5 oz (89.4 kg)  05/05/23 197 lb (89.4 kg)    GEN: Well nourished, well developed in no acute distress NECK: No JVD; No carotid bruits CARDIAC: ***RRR, no murmurs, rubs, gallops RESPIRATORY:  Clear to auscultation without rales, wheezing or rhonchi  ABDOMEN: Soft, non-tender, non-distended EXTREMITIES:  No edema; No acute deformity ***      Assessment and Plan:  Assessment and Plan Assessment & Plan      {Are you ordering a CV Procedure (e.g. stress test, cath, DCCV, TEE, etc)?   Press F2        :[id number]}  Dispo:  No  follow-ups on file.  Signed, Ava Boatman, NP

## 2024-05-11 ENCOUNTER — Other Ambulatory Visit: Payer: Self-pay | Admitting: Medical

## 2024-05-17 ENCOUNTER — Other Ambulatory Visit: Payer: Self-pay | Admitting: Medical

## 2024-05-22 ENCOUNTER — Encounter: Payer: Self-pay | Admitting: Medical

## 2024-05-22 ENCOUNTER — Ambulatory Visit: Attending: Medical | Admitting: Medical

## 2024-05-22 VITALS — BP 115/60 | HR 89 | Ht 74.0 in | Wt 205.8 lb

## 2024-05-22 DIAGNOSIS — I10 Essential (primary) hypertension: Secondary | ICD-10-CM | POA: Diagnosis not present

## 2024-05-22 DIAGNOSIS — Z720 Tobacco use: Secondary | ICD-10-CM

## 2024-05-22 DIAGNOSIS — F1090 Alcohol use, unspecified, uncomplicated: Secondary | ICD-10-CM

## 2024-05-22 DIAGNOSIS — I77810 Thoracic aortic ectasia: Secondary | ICD-10-CM

## 2024-05-22 DIAGNOSIS — I5022 Chronic systolic (congestive) heart failure: Secondary | ICD-10-CM

## 2024-05-22 DIAGNOSIS — E782 Mixed hyperlipidemia: Secondary | ICD-10-CM | POA: Diagnosis not present

## 2024-05-22 DIAGNOSIS — Z79899 Other long term (current) drug therapy: Secondary | ICD-10-CM

## 2024-05-22 MED ORDER — ENTRESTO 24-26 MG PO TABS
1.0000 | ORAL_TABLET | Freq: Two times a day (BID) | ORAL | 3 refills | Status: DC
Start: 2024-05-22 — End: 2024-09-18

## 2024-05-22 MED ORDER — METOPROLOL SUCCINATE ER 100 MG PO TB24: ORAL_TABLET | ORAL | 3 refills | Status: AC

## 2024-05-22 MED ORDER — SPIRONOLACTONE 25 MG PO TABS
12.5000 mg | ORAL_TABLET | Freq: Every day | ORAL | 3 refills | Status: AC
Start: 2024-05-22 — End: ?

## 2024-05-22 MED ORDER — EMPAGLIFLOZIN 10 MG PO TABS: 10.0000 mg | ORAL_TABLET | Freq: Every day | ORAL | 3 refills | Status: AC

## 2024-05-22 NOTE — Patient Instructions (Signed)
 Medication Instructions:  Your physician recommends that you continue on your current medications as directed. Please refer to the Current Medication list given to you today.    *If you need a refill on your cardiac medications before your next appointment, please call your pharmacy*  Lab Work: Your provider would like for you to have following labs drawn today (CBC).     Testing/Procedures: No test ordered today   Follow-Up: At [facility], you and your health needs are our priority.  As part of our continuing mission to provide you with exceptional heart care, our providers are all part of one team.  This team includes your primary Cardiologist (physician) and Advanced Practice Providers or APPs (Physician Assistants and Nurse Practitioners) who all work together to provide you with the care you need, when you need it.  Your next appointment:   1 year(s)  Provider:   Deatrice Cage, MD or Cadence Franchester, PA-C

## 2024-05-22 NOTE — Progress Notes (Unsigned)
 Cardiology Office Note   Date:  05/22/2024  ID:  Randy Walker, DOB 05-17-1956, MRN [id number] PCP: Norleen Lynwood ORN, MD   [facility] Providers Cardiologist:  Deatrice Cage, MD   History of Present Illness Randy Walker is a 68 y.o. male with a hx of HTN, HLD, HFrEF, NICM, left renal cancer s/p nephrectomy, former smoker, alcohol  use, and mild aortic aneurysm who presents for 1 year follow-up.    He was referred to Dr. Cage following CTA on 08/2021 for monitoring of aortic aneurysm. It also showed a calcium score or 29.    On the initial visit he was noted to have sinus tachycardia and reported mild dyspnea on exertion. He was started on toprol . Echo was ordered which showed LVEF 40-45%, G2DD, moderately dilated ascending aorta at 44mm. He was set up for cardiac cath which showed normal coronaries.    Patient was last seen 03/2022 and was overall doing well. He was on Toprol , Jardiance , Entresto . Spironolactone  was started. Labs and repeat limited echo were ordered. Repeat limited echo 05/2022 showed LVEF 40-45%.  The patient was admitted 02/2024 for Adrenal insufficiency.   Today, the patient is overall doing well. He is on chronic steroids for adrenal insufficiency. He denies chest pain or SOB, however he does not formal activity. No lower leg edema, heart racing, palpitations. He is drinking 4 alcohol  drinks daily. He smokes 3 cigarettes daily.     Studies Reviewed EKG Interpretation Date/Time:  Tuesday May 22 2024 15:42:16 EDT Ventricular Rate:  89 PR Interval:  156 QRS Duration:  110 QT Interval:  368 QTC Calculation: 447 R Axis:   -58  Text Interpretation: Normal sinus rhythm Incomplete right bundle branch block Left anterior fascicular block Minimal voltage criteria for LVH, may be normal variant ( Cornell product ) ST & T wave abnormality, consider anterior ischemia When compared with ECG of 29-Feb-2024 12:01, Nonspecific T wave abnormality now evident in Inferior  leads T wave inversion now evident in Anterior leads Confirmed by Franchester, Shilee Biggs ([id number]) on 05/22/2024 3:45:00 PM     Limited echo 05/2022 1. Left ventricular ejection fraction, by estimation, is 40 to 45%. The  left ventricle has mildly decreased function. The left ventricle  demonstrates global hypokinesis. The average left ventricular global  longitudinal strain is -15.8 %.   2. Right ventricular systolic function is normal. The right ventricular  size is normal.   3. The mitral valve is normal in structure. No evidence of mitral valve  regurgitation. No evidence of mitral stenosis.   4. The aortic valve is normal in structure. Aortic valve regurgitation is  mild. No aortic stenosis is present.   5. The inferior vena cava is normal in size with greater than 50%  respiratory variability, suggesting right atrial pressure of 3 mmHg.   Comparison(s): Heart cath reported LVEF of 45-50% in 11/2021.    Cardiac cath 11/2021 RIGHT/LEFT HEART CATH AND CORONARY ANGIOGRAPHY    Conclusion       There is mild left ventricular systolic dysfunction.   LV end diastolic pressure is normal.   The left ventricular ejection fraction is 45-50% by visual estimate.   1.  Normal coronary arteries. 2.  Mildly reduced LV systolic function with an EF of 45%. 3.  Right heart catheterization showed normal filling pressures, normal pulmonary pressure and normal cardiac output.   Recommendations: The patient has mild nonischemic cardiomyopathy.  Recommend continuing medical therapy.  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  Maximize the dose  of Toprol  as tolerated.   Echo 10/2021 1. Left ventricular ejection fraction, by estimation, is 40 to 45%. The  left ventricle has mildly decreased function. The left ventricle  demonstrates global hypokinesis. The left ventricular internal cavity size  was mildly dilated. Left ventricular  diastolic parameters are consistent with Grade II diastolic dysfunction   (pseudonormalization).   2. Right ventricular systolic function is normal. The right ventricular  size is normal.   3. The mitral valve is normal in structure. Mild mitral valve  regurgitation. No evidence of mitral stenosis.   4. The aortic valve is tricuspid. Aortic valve regurgitation is mild to  moderate. Aortic valve sclerosis is present, with no evidence of aortic  valve stenosis.   5. There is mild dilatation of the aortic root, measuring 43 mm. There is  mild dilatation of the ascending aorta, measuring 44 mm.   6. The inferior vena cava is normal in size with greater than 50%  respiratory variability, suggesting right atrial pressure of 3 mmHg.     Risk Assessment/Calculations           Physical Exam VS:  BP 115/60 (BP Location: Left Arm, Patient Position: Sitting, Cuff Size: Normal)   Pulse 89   Ht 6' 2 (1.88 m)   Wt 205 lb 12.8 oz (93.4 kg)   SpO2 98%   BMI 26.42 kg/m        Wt Readings from Last 3 Encounters:  05/22/24 205 lb 12.8 oz (93.4 kg)  03/06/24 193 lb (87.5 kg)  02/29/24 197 lb 1.5 oz (89.4 kg)    GEN: Well nourished, well developed in no acute distress NECK: No JVD; No carotid bruits CARDIAC: RRR, no murmurs, rubs, gallops RESPIRATORY:  Clear to auscultation without rales, wheezing or rhonchi  ABDOMEN: Soft, non-tender, non-distended EXTREMITIES:  No edema; No deformity   ASSESSMENT AND PLAN  HFmrEF Repeat limited echo showed LVEF 40-45%. The patient is euvolemic on exam. I will send in refills of cardiac medications. Continue spiro 12.5mg  daily, Jardiance  10mg  daily, Entresto  24-26mg BID, and Toprol  100mg  daily.   Dilated ascending aorta Stable 4.1cm on most recent. Repeat in 1  year.   HLD LDL 86. Continue Repatha .   HTN BP is good, continue current medications.   Alcohol  use  He drinks 4 drinks daily  Tobacco use He smokes 3 cigarettes daily.         Dispo: Follow-up in 1 year  Signed, Randy Dion VEAR Fishman, PA-C

## 2024-07-12 ENCOUNTER — Ambulatory Visit (INDEPENDENT_AMBULATORY_CARE_PROVIDER_SITE_OTHER): Admitting: Internal Medicine

## 2024-07-12 ENCOUNTER — Encounter: Payer: Self-pay | Admitting: Internal Medicine

## 2024-07-12 VITALS — BP 126/82 | HR 77 | Temp 98.3°F | Ht 74.0 in | Wt 206.4 lb

## 2024-07-12 DIAGNOSIS — F17200 Nicotine dependence, unspecified, uncomplicated: Secondary | ICD-10-CM

## 2024-07-12 DIAGNOSIS — I10 Essential (primary) hypertension: Secondary | ICD-10-CM | POA: Diagnosis not present

## 2024-07-12 DIAGNOSIS — J309 Allergic rhinitis, unspecified: Secondary | ICD-10-CM

## 2024-07-12 DIAGNOSIS — E271 Primary adrenocortical insufficiency: Secondary | ICD-10-CM

## 2024-07-12 MED ORDER — PREDNISONE 10 MG PO TABS
ORAL_TABLET | ORAL | 0 refills | Status: AC
Start: 2024-07-12 — End: ?

## 2024-07-12 MED ORDER — HYDROCORTISONE 20 MG PO TABS: 20.0000 mg | ORAL_TABLET | Freq: Every day | ORAL | 3 refills | Status: AC

## 2024-07-12 MED ORDER — METHYLPREDNISOLONE ACETATE 80 MG/ML IJ SUSP
80.0000 mg | Freq: Once | INTRAMUSCULAR | Status: AC
Start: 2024-07-12 — End: 2024-07-12
  Administered 2024-07-12: 80 mg via INTRAMUSCULAR

## 2024-07-12 NOTE — Assessment & Plan Note (Addendum)
 Pt taking no tx since may 2025, Ok to try transition to oral hydrocortisone  20 mg every day, pt to f/u with endo as planned

## 2024-07-12 NOTE — Assessment & Plan Note (Signed)
 Mild to mod, for depomedrol 80 mg IM, prednsisone taper, to f/u any worsening symptoms or concerns

## 2024-07-12 NOTE — Assessment & Plan Note (Signed)
 Pt counsled to quit, pt no quite ready for completely quit

## 2024-07-12 NOTE — Progress Notes (Signed)
 Patient ID: Randy Walker, male   DOB: 1956/07/04, 67 y.o.   MRN: [id number]        Chief Complaint: follow up allergic rhinitis with vomiting in the am x 2 wks, adrenal insufficiency, smoker,        HPI:  Randy Walker is a 68 y.o. male here to f/u overall doing ok - Does have several wks ongoing nasal allergy symptoms with clearish congestion, itch and sneezing, without fever, pain, ST, cough, swelling or wheezing but has so much congestion in the am he ends up with n/v due to drainage it seems.  On med review, pt never started the hydrocort  injections sent per endo due to cost.  Has not been taking any tx since April 2025 hospn.  Still smoking but down to 3 cigarettes per day only.  Wife vapes so hard to quit completely.         Wt Readings from Last 3 Encounters:  07/12/24 206 lb 6.4 oz (93.6 kg)  05/22/24 205 lb 12.8 oz (93.4 kg)  03/06/24 193 lb (87.5 kg)   BP Readings from Last 3 Encounters:  07/12/24 126/82  05/22/24 115/60  03/06/24 120/88         Past Medical History:  Diagnosis Date   Adenomatous polyps 11/17/2011   Alcohol  use 11/17/2011   ALLERGIC RHINITIS 09/10/2007   ANXIETY 09/10/2007   BACK PAIN 12/31/2010   CHF (congestive heart failure) (HCC)    DISC DISEASE, LUMBAR 12/31/2010   GERD (gastroesophageal reflux disease) 11/23/2016   History of kidney cancer    Hyperglycemia 11/23/2016   HYPERLIPIDEMIA 12/31/2010   Hypertension    Personal history of rectal adenomas 01/29/2008   PSORIASIS 12/31/2010   Psoriatic arthritis (HCC)    PULMONARY EMBOLISM, HX OF 09/10/2007   RASH-NONVESICULAR 04/23/2009   Renal mass    SMOKER 12/31/2010   Past Surgical History:  Procedure Laterality Date   LYMPH NODE DISSECTION Bilateral 08/27/2020   Procedure: RETROPERITONEAL LYMPH NODE DISSECTION;  Surgeon: Alvaro Hummer, MD;  Location: [facility];  Service: Urology;  Laterality: Bilateral;   RIGHT/LEFT HEART CATH AND CORONARY ANGIOGRAPHY N/A 12/15/2021   Procedure: RIGHT/LEFT  HEART CATH AND CORONARY ANGIOGRAPHY;  Surgeon: Darron Deatrice DELENA, MD;  Location: [facility];  Service: Cardiovascular;  Laterality: N/A;   ROBOT ASSISTED LAPAROSCOPIC NEPHRECTOMY Left 08/27/2020   Procedure: XI ROBOTIC ASSISTED LAPAROSCOPIC RADICAL NEPHRECTOMY;  Surgeon: Alvaro Hummer, MD;  Location: [facility];  Service: Urology;  Laterality: Left;  3.5 HRS   ROBOTIC ADRENALECTOMY Right 07/07/2022   Procedure: XI ROBOTIC PARTIAL ADRENALECTOMY, INTRAOPERATIVE ULTRASOUND;  Surgeon: Alvaro Hummer, MD;  Location: [facility];  Service: Urology;  Laterality: Right;   ROTATOR CUFF REPAIR  Jan, 2010   [facility], Left   spleen repair     TOTAL HIP ARTHROPLASTY Left 09/09/2017   Procedure: LEFT TOTAL HIP ARTHROPLASTY ANTERIOR APPROACH;  Surgeon: Yvone Rush, MD;  Location: [facility];  Service: Orthopedics;  Laterality: Left;   WISDOM TOOTH EXTRACTION      reports that he has been smoking cigarettes. He has a 10 pack-year smoking history. He has never used smokeless tobacco. He reports current alcohol  use of about 21.0 standard drinks of alcohol  per week. He reports that he does not use drugs. family history includes Heart attack in his father. No Known Allergies Current Outpatient Medications on File Prior to Visit  Medication Sig Dispense Refill   docusate sodium  (COLACE) 100 MG capsule Take 1 capsule (100 mg total)  by mouth 2 (two) times daily.     empagliflozin  (JARDIANCE ) 10 MG TABS tablet Take 1 tablet (10 mg total) by mouth daily before breakfast. 90 tablet 3   Evolocumab  (REPATHA  SURECLICK) 140 MG/ML SOAJ Inject 140 mg into the skin every 14 (fourteen) days. 6 mL 3   FLUoxetine (PROZAC) 20 MG capsule Take 20 mg by mouth daily.     hydrocortisone  sodium succinate  (SOLU-CORTEF ) 100 MG injection Inject 1 mL (50 mg total) into the muscle as directed as needed for 30 days. 1 each 11   metoprolol  succinate (TOPROL -XL) 100 MG 24 hr tablet TAKE 1 TABLET BY MOUTH ONCE A DAY. TAKE WITH OR IMMEDIATELY  FOLLOWING A MEAL 90 tablet 3   ondansetron  (ZOFRAN -ODT) 4 MG disintegrating tablet Take 1 tablet (4 mg total) by mouth every 8 (eight) hours as needed for nausea or vomiting. 20 tablet 0   pantoprazole  (PROTONIX ) 40 MG tablet TAKE ONE TABLET (40 MG TOTAL) BY MOUTH DAILY. 90 tablet 0   sacubitril -valsartan  (ENTRESTO ) 24-26 MG Take 1 tablet by mouth 2 (two) times daily. 180 tablet 3   spironolactone  (ALDACTONE ) 25 MG tablet Take 0.5 tablets (12.5 mg total) by mouth daily. 45 tablet 3   Risankizumab-rzaa (SKYRIZI PEN [place]) Inject 180 mg into the skin every 3 (three) months. (Patient not taking: Reported on 07/12/2024)     SYRINGE-NEEDLE, DISP, 3 ML 22G X 1-1/2 3 ML MISC Use to inject Solu-Cortef  soultion as directed. (Patient not taking: Reported on 07/12/2024) 25 each 11   No current facility-administered medications on file prior to visit.        ROS:  All others reviewed and negative.  Objective        PE:  BP 126/82   Pulse 77   Temp 98.3 F (36.8 C) (Oral)   Ht 6' 2 (1.88 m)   Wt 206 lb 6.4 oz (93.6 kg)   SpO2 98%   BMI 26.50 kg/m                 Constitutional: Pt appears in NAD               HENT: Head: NCAT.                Right Ear: External ear normal.                 Left Ear: External ear normal. Bilat tm's with mild erythema.  Max sinus areas non tender.  Pharynx with mild erythema, no exudate               Eyes: . Pupils are equal, round, and reactive to light. Conjunctivae and EOM are normal               Nose: without d/c or deformity               Neck: Neck supple. Gross normal ROM               Cardiovascular: Normal rate and regular rhythm.                 Pulmonary/Chest: Effort normal and breath sounds without rales or wheezing.                Abd:  Soft, NT, ND, + BS, no organomegaly               Neurological: Pt is alert. At baseline orientation, motor grossly intact  Skin: Skin is warm. No rashes, no other new lesions, LE edema - none                Psychiatric: Pt behavior is normal without agitation   Micro: none  Cardiac tracings I have personally interpreted today:  none  Pertinent Radiological findings (summarize): none   Lab Results  Component Value Date   WBC 3.6 (L) 03/02/2024   HGB 12.7 (L) 03/02/2024   HCT 34.1 (L) 03/02/2024   PLT 141 (L) 03/02/2024   GLUCOSE 94 03/06/2024   CHOL 170 10/06/2021   TRIG 127 10/06/2021   HDL 55 10/06/2021   LDLDIRECT 90.0 10/07/2020   LDLCALC 93 10/06/2021   ALT 13 03/01/2024   AST 19 03/01/2024   NA 135 03/06/2024   K 4.9 03/06/2024   CL 98 03/06/2024   CREATININE 1.12 03/06/2024   BUN 10 03/06/2024   CO2 31 03/06/2024   TSH 2.310 10/06/2021   PSA 0.33 10/07/2020   INR 0.94 09/05/2017   HGBA1C 5.5 03/06/2024   Assessment/Plan:  Randy Walker is a 68 y.o. White or Caucasian [1] male with  has a past medical history of Adenomatous polyps (11/17/2011), Alcohol  use (11/17/2011), ALLERGIC RHINITIS (09/10/2007), ANXIETY (09/10/2007), BACK PAIN (12/31/2010), CHF (congestive heart failure) (HCC), DISC DISEASE, LUMBAR (12/31/2010), GERD (gastroesophageal reflux disease) (11/23/2016), History of kidney cancer, Hyperglycemia (11/23/2016), HYPERLIPIDEMIA (12/31/2010), Hypertension, Personal history of rectal adenomas (01/29/2008), PSORIASIS (12/31/2010), Psoriatic arthritis (HCC), PULMONARY EMBOLISM, HX OF (09/10/2007), RASH-NONVESICULAR (04/23/2009), Renal mass, and SMOKER (12/31/2010).  SMOKER Pt counsled to quit, pt no quite ready for completely quit  Hypertension BP Readings from Last 3 Encounters:  07/12/24 126/82  05/22/24 115/60  03/06/24 120/88   Stable, pt to continue medical treatment toprol  xl 100 every day, entresto     Allergic rhinitis Mild to mod, for depomedrol 80 mg IM, prednsisone taper, to f/u any worsening symptoms or concerns   Adrenal insufficiency (Addison's disease) (HCC) Pt taking no tx since may 2025, Ok to try transition to oral hydrocortisone  20 mg  every day, pt to f/u with endo as planned  Followup: Return if symptoms worsen or fail to improve.  Lynwood Rush, MD 07/12/2024 1:13 PM [facility]

## 2024-07-12 NOTE — Assessment & Plan Note (Signed)
 BP Readings from Last 3 Encounters:  07/12/24 126/82  05/22/24 115/60  03/06/24 120/88   Stable, pt to continue medical treatment toprol  xl 100 every day, entresto 

## 2024-07-12 NOTE — Patient Instructions (Addendum)
 You had the steroid shot today  Please take all new medication as prescribed - the prednisone  for a short time, but also hydrocortisone  oral at 20 mg per day  [facility] also take the OTC Zyrtec 10 mg per day, and / or OTC Nasacort  as well  Please quit smoking  Please continue all other medications as before,   Please have the pharmacy call with any other refills you may need.  Please continue your efforts at being more active, low cholesterol diet, and weight control.  Please keep your appointments with your specialists as you may have planned  We can hold on lab test today but you should have further testing in follow up with Dr Tommas to make sure the dose is good enough for you

## 2024-07-13 ENCOUNTER — Other Ambulatory Visit: Payer: Self-pay | Admitting: Internal Medicine

## 2024-07-13 NOTE — Telephone Encounter (Signed)
 Ok done erx

## 2024-07-13 NOTE — Telephone Encounter (Signed)
 Please review refill request, I can not find a diagnoses that I think would be suitable for this.

## 2024-08-31 ENCOUNTER — Other Ambulatory Visit: Payer: Self-pay | Admitting: Medical

## 2024-09-06 ENCOUNTER — Ambulatory Visit: Admitting: Internal Medicine

## 2024-09-11 ENCOUNTER — Ambulatory Visit: Payer: Self-pay

## 2024-09-11 ENCOUNTER — Emergency Department

## 2024-09-11 ENCOUNTER — Other Ambulatory Visit: Payer: Self-pay

## 2024-09-11 ENCOUNTER — Encounter: Payer: Self-pay | Admitting: Emergency Medicine

## 2024-09-11 ENCOUNTER — Emergency Department: Admission: EM | Admit: 2024-09-11 | Discharge: 2024-09-12 | Disposition: A | Discharge status: Home / Self Care

## 2024-09-11 DIAGNOSIS — Z96642 Presence of left artificial hip joint: Secondary | ICD-10-CM | POA: Insufficient documentation

## 2024-09-11 DIAGNOSIS — S06361A Traumatic hemorrhage of cerebrum, unspecified, with loss of consciousness of 30 minutes or less, initial encounter: Secondary | ICD-10-CM | POA: Insufficient documentation

## 2024-09-11 DIAGNOSIS — W01198A Fall on same level from slipping, tripping and stumbling with subsequent striking against other object, initial encounter: Secondary | ICD-10-CM | POA: Insufficient documentation

## 2024-09-11 DIAGNOSIS — R55 Syncope and collapse: Secondary | ICD-10-CM

## 2024-09-11 DIAGNOSIS — S0181XA Laceration without foreign body of other part of head, initial encounter: Secondary | ICD-10-CM | POA: Insufficient documentation

## 2024-09-11 DIAGNOSIS — Z23 Encounter for immunization: Secondary | ICD-10-CM | POA: Diagnosis not present

## 2024-09-11 DIAGNOSIS — Z79899 Other long term (current) drug therapy: Secondary | ICD-10-CM | POA: Diagnosis not present

## 2024-09-11 DIAGNOSIS — I10 Essential (primary) hypertension: Secondary | ICD-10-CM | POA: Diagnosis not present

## 2024-09-11 DIAGNOSIS — F17210 Nicotine dependence, cigarettes, uncomplicated: Secondary | ICD-10-CM | POA: Diagnosis not present

## 2024-09-11 LAB — URINALYSIS, ROUTINE W REFLEX MICROSCOPIC
Bacteria, UA: NONE SEEN
Bilirubin Urine: NEGATIVE
Glucose, UA: >=500 mg/dL — AB
Hgb urine dipstick: NEGATIVE
Ketones, ur: NEGATIVE mg/dL
Leukocytes,Ua: NEGATIVE
Nitrite: NEGATIVE
Protein, ur: NEGATIVE mg/dL
Specific Gravity, Urine: 1.005 (ref 1.005–1.030)
pH: 5 (ref 5.0–8.0)

## 2024-09-11 LAB — COMPREHENSIVE METABOLIC PANEL WITH GFR
ALT: 21 U/L (ref 0–44)
AST: 21 U/L (ref 15–41)
Albumin: 3.2 g/dL — ABNORMAL LOW (ref 3.5–5.0)
Alkaline Phosphatase: 72 U/L (ref 38–126)
Anion gap: 10 (ref 5–15)
BUN: 19 mg/dL (ref 8–23)
CO2: 22 mmol/L (ref 22–32)
Calcium: 8.4 mg/dL — ABNORMAL LOW (ref 8.9–10.3)
Chloride: 99 mmol/L (ref 98–111)
Creatinine, Ser: 1.2 mg/dL (ref 0.61–1.24)
GFR, Estimated: >60 mL/min (ref >60)
Glucose, Bld: 89 mg/dL (ref 70–99)
Potassium: 4.4 mmol/L (ref 3.5–5.1)
Sodium: 131 mmol/L — ABNORMAL LOW (ref 135–145)
Total Bilirubin: 0.7 mg/dL (ref 0.0–1.2)
Total Protein: 6.2 g/dL — ABNORMAL LOW (ref 6.5–8.1)

## 2024-09-11 LAB — CBC
HCT: 40.6 % (ref 39.0–52.0)
Hemoglobin: 13.9 g/dL (ref 13.0–17.0)
MCH: 34.7 pg — ABNORMAL HIGH (ref 26.0–34.0)
MCHC: 34.2 g/dL (ref 30.0–36.0)
MCV: 101.2 fL — ABNORMAL HIGH (ref 80.0–100.0)
Platelets: 149 K/uL — ABNORMAL LOW (ref 150–400)
RBC: 4.01 MIL/uL — ABNORMAL LOW (ref 4.22–5.81)
RDW: 13.6 % (ref 11.5–15.5)
WBC: 8.2 K/uL (ref 4.0–10.5)
nRBC: 0 % (ref 0.0–0.2)

## 2024-09-11 LAB — TROPONIN I (HIGH SENSITIVITY)
Troponin I (High Sensitivity): 8 ng/L (ref <18)
Troponin I (High Sensitivity): 7 ng/L (ref <18)

## 2024-09-11 MED ORDER — LEVETIRACETAM 500 MG PO TABS
500.0000 mg | ORAL_TABLET | Freq: Once | ORAL | Status: AC
  Administered 2024-09-11: 500 mg via ORAL
  Filled 2024-09-11: qty 1

## 2024-09-11 MED ORDER — PROCHLORPERAZINE EDISYLATE 10 MG/2ML IJ SOLN
10.0000 mg | Freq: Once | INTRAMUSCULAR | Status: AC
Start: 2024-09-11 — End: 2024-09-11
  Administered 2024-09-11: 10 mg via INTRAVENOUS
  Filled 2024-09-11: qty 2

## 2024-09-11 MED ORDER — LIDOCAINE VISCOUS HCL 2 % MT SOLN
15.0000 mL | Freq: Once | OROMUCOSAL | Status: AC
  Administered 2024-09-11: 15 mL via OROMUCOSAL
  Filled 2024-09-11: qty 15

## 2024-09-11 MED ORDER — LIDOCAINE-EPINEPHRINE-TETRACAINE (LET) TOPICAL GEL
3.0000 mL | Freq: Once | TOPICAL | Status: DC
  Filled 2024-09-11: qty 3

## 2024-09-11 MED ORDER — ACETAMINOPHEN 500 MG PO TABS
1000.0000 mg | ORAL_TABLET | Freq: Once | ORAL | Status: AC
  Administered 2024-09-11: 1000 mg via ORAL
  Filled 2024-09-11: qty 2

## 2024-09-11 NOTE — ED Provider Notes (Signed)
 [facility] Provider Note    Event Date/Time   First MD Initiated Contact with Patient 09/11/24 1954     (approximate)   History   Loss of Consciousness  Patient to ED via [facility] for syncope. PT reports standing up too quickly and passing out hitting face on bookcase. Puncture wound to right cheek. Denies any other pain. Does not take blood thinners. Hx Addison's disease  18 L wrist   HPI Randy Walker is a 68 y.o. male PMH hypertension, hyperlipidemia, prior pulmonary embolism no longer on anticoagulation, [facility] presents for evaluation after a syncopal episode - Patient was standing up quickly when he suddenly felt lightheaded and passed out hitting his face on a bookcase.  Not on blood thinners. -Has had syncopal episodes in the past -No preceding chest pain, shortness of breath -Has otherwise been in his usual state of health -No seizure-like activity.  Collateral gathered from his wife bedside. -Primarily complaining of headache     Physical Exam   Triage Vital Signs: ED Triage Vitals [09/11/24 1850]  Encounter Vitals Group     BP 108/88     Girls Systolic BP Percentile      Girls Diastolic BP Percentile      Boys Systolic BP Percentile      Boys Diastolic BP Percentile      Pulse Rate 86     Resp 17     Temp 97.6 F (36.4 C)     Temp Source Oral     SpO2 98 %     Weight 185 lb (83.9 kg)     Height 6' 2 (1.88 m)     Head Circumference      Peak Flow      Pain Score 6     Pain Loc      Pain Education      Exclude from Growth Chart     Most recent vital signs: Vitals:   09/11/24 2304 09/12/24 0000  BP:  119/81  Pulse:  74  Resp:  19  Temp: (!) 97.4 F (36.3 C)   SpO2:  97%     General: Awake, no distress.  HEENT: 1 cm laceration to right cheek, through and through, hemostatic, wound explored through full range of motion and bloodless field, no foreign bodies visualized CV:  Good peripheral perfusion. RRR, RP  2+ Resp:  Normal effort. CTAB Abd:  No distention. Nontender to deep palpation throughout Other:  No midline back pain, no midline neck pain, full range of motion of extremities all joints with no tenderness to palpation throughout   ED Results / Procedures / Treatments   Labs (all labs ordered are listed, but only abnormal results are displayed) Labs Reviewed  COMPREHENSIVE METABOLIC PANEL WITH GFR - Abnormal; Notable for the following components:      Result Value   Sodium 131 (*)    Calcium 8.4 (*)    Total Protein 6.2 (*)    Albumin  3.2 (*)    All other components within normal limits  CBC - Abnormal; Notable for the following components:   RBC 4.01 (*)    MCV 101.2 (*)    MCH 34.7 (*)    Platelets 149 (*)    All other components within normal limits  URINALYSIS, ROUTINE W REFLEX MICROSCOPIC - Abnormal; Notable for the following components:   Color, Urine STRAW (*)    APPearance CLEAR (*)    Glucose, UA >=500 (*)  All other components within normal limits  TROPONIN I (HIGH SENSITIVITY)  TROPONIN I (HIGH SENSITIVITY)     EKG Ecg = sinus rhythm, rate 88, no gross ST elevation or depression, significant repolarization, left axis deviation normal intervals.  No clear evidence of ischemia no arrhythmia on my interpretation.    RADIOLOGY CT head with hemorrhage and corpus callosum.  Also puncture wound to right cheek.  No underlying facial fracture.    PROCEDURES:  Critical Care performed: Yes, see critical care procedure note(s)  .Critical Care  Performed by: Clarine Ozell LABOR, MD Authorized by: Clarine Ozell LABOR, MD   Critical care provider statement:    Critical care time (minutes):  30   Critical care time was exclusive of:  Separately billable procedures and treating other patients   Critical care was necessary to treat or prevent imminent or life-threatening deterioration of the following conditions:  CNS failure or compromise   Critical care was time spent  personally by me on the following activities:  Development of treatment plan with patient or surrogate, discussions with consultants, evaluation of patient's response to treatment, examination of patient, ordering and review of laboratory studies, ordering and review of radiographic studies, ordering and performing treatments and interventions, pulse oximetry, re-evaluation of patient's condition and review of old charts   I assumed direction of critical care for this patient from another provider in my specialty: no   .Laceration Repair  Date/Time: 09/12/2024 12:50 AM  Performed by: Clarine Ozell LABOR, MD Authorized by: Clarine Ozell LABOR, MD   Consent:    Consent obtained:  Verbal   Consent given by:  Patient   Risks, benefits, and alternatives were discussed: yes     Risks discussed:  Infection, poor cosmetic result, retained foreign body, poor wound healing, nerve damage, need for additional repair, pain and vascular damage   Alternatives discussed:  No treatment and delayed treatment Universal protocol:    Procedure explained and questions answered to patient or proxy's satisfaction: yes     Test results available: yes     Imaging studies available: yes     Patient identity confirmed:  Verbally with patient and arm band Anesthesia:    Anesthesia method:  Topical application   Topical anesthetic:  Lidocaine  gel Laceration details:    Location:  Face   Face location:  R cheek   Length (cm):  1.5 Pre-procedure details:    Preparation:  Patient was prepped and draped in usual sterile fashion Exploration:    Hemostasis achieved with:  Direct pressure   Wound exploration: wound explored through full range of motion and entire depth of wound visualized     Wound extent: no underlying fracture and no vascular damage     Contaminated: no   Treatment:    Area cleansed with:  Saline   Amount of cleaning:  Extensive   Irrigation solution:  Sterile saline   Irrigation volume:  100    Irrigation method:  Pressure wash   Visualized foreign bodies/material removed: no     Layers/structures repaired:  Muscle fascia (internal mucosa) Muscle fascia:    Suture size:  6-0   Wound fascia closure material used: absorbable.   Suture technique:  Simple interrupted   Number of sutures:  1 Skin repair:    Repair method:  Sutures   Suture size:  6-0   Suture material:  Nylon   Suture technique:  Simple interrupted   Number of sutures:  3 Approximation:    Approximation:  Close Repair type:    Repair type:  Intermediate Post-procedure details:    Dressing:  Open (no dressing)   Procedure completion:  Tolerated well, no immediate complications    MEDICATIONS ORDERED IN ED: Medications  sodium chloride  0.9 % bolus 250 mL (has no administration in time range)  acetaminophen  (TYLENOL ) tablet 1,000 mg (1,000 mg Oral Given 09/11/24 2047)  prochlorperazine (COMPAZINE) injection 10 mg (10 mg Intravenous Given 09/11/24 2047)  levETIRAcetam (KEPPRA) tablet 500 mg (500 mg Oral Given 09/11/24 2058)  lidocaine  (XYLOCAINE ) 2 % viscous mouth solution 15 mL (15 mLs Mouth/Throat Given 09/11/24 2100)  oxyCODONE  (Oxy IR/ROXICODONE ) immediate release tablet 5 mg (5 mg Oral Given 09/12/24 0028)  Tdap (ADACEL) injection 0.5 mL (0.5 mLs Intramuscular Given 09/12/24 0055)     IMPRESSION / MDM / ASSESSMENT AND PLAN / ED COURSE  I reviewed the triage vital signs and the nursing notes.                              DDX/MDM/AP: Differential diagnosis includes, but is not limited to, facial fracture, skull fracture, intracranial hemorrhage, considered but doubt C-spine fracture.  No evidence of extremity injuries.  Regarding syncopal episode, suspect likely orthostatic in nature, consider underlying dehydration.  Consider transient arrhythmia, doubt ACS.  Plan: - Labs - CT head, CT C-spine -Pain control - Will update Tdap - EKG - Anticipate laceration repair  Patient's presentation is most  consistent with acute presentation with potential threat to life or bodily function.  The patient is on the cardiac monitor to evaluate for evidence of arrhythmia and/or significant heart rate changes.  ED course below.  Workup with intracranial hemorrhage, neurosurgery consulted, recommend repeat 6-hour CT-if stable, stable for discharge home from their perspective.  Would recommend Keppra 500 mg twice daily for 1 week, prescribed.  They will follow with patient in clinic.  Laceration repaired, 3 nonabsorbable external sutures and 1 absorbable mucosal suture placed for through and through laceration.  Regarding syncopal episode, workup overall reassuring, did discuss possible admission for further workup of this with patient though he prefers discharge home with outpatient follow-up with his PMD which I believe is reasonable assuming repeat CT head stable.  Signed out to overnight ED provider Dr. Cyrena pending repeat CT head.  Clinical Course as of 09/12/24 0056  Tue Sep 11, 2024  1935 Call from radiology puncture wound r cheek, no hematoma - cspine ok - small corpus callosum hemorrhage, no ivh extension, no mass effect  Will room shortly and discuss w/ nsgy [MM]  2041 Nsgy recs: - Repeat CT head 6 hours after initial -> stable for discharge if unremarkable -Keppra 500 mg twice daily for 1 week -Neurosurgery will arrange follow-up in clinic [MM]    Clinical Course User Index [MM] Clarine Ozell LABOR, MD     FINAL CLINICAL IMPRESSION(S) / ED DIAGNOSES   Final diagnoses:  Traumatic intracerebral hemorrhage with loss of consciousness of 30 minutes or less, unspecified laterality, initial encounter (HCC)  Facial laceration, initial encounter  Syncope, unspecified syncope type     Rx / DC Orders   ED Discharge Orders          Ordered    levETIRAcetam (KEPPRA) 500 MG tablet  2 times daily        09/12/24 0056    acetaminophen  (TYLENOL ) 500 MG tablet  Every 6 hours PRN        09/12/24  0056  Ambulatory referral to Neurosurgery       Comments: Please Select To Department: [facility] for Nerve or Spine  Please select To Department: [facility] AT [place] for Cranial or Neurovascular   09/12/24 0056             Note:  This document was prepared using Dragon voice recognition software and may include unintentional dictation errors.   Clarine Ozell LABOR, MD 09/12/24 [phone]

## 2024-09-11 NOTE — Telephone Encounter (Signed)
 FYI Only or Action Required?: FYI only for provider.  Patient was last seen in primary care on 07/12/2024 by Norleen Lynwood ORN, MD.  Called Nurse Triage reporting Sore Throat.  Symptoms began several weeks ago.  Interventions attempted: OTC medications: cough drops.  Symptoms are: unchanged.  Triage Disposition: See PCP When Office is Open (Within 3 Days)  Patient/caregiver understands and will follow disposition?: Yes  Copied from CRM #[id number]. Topic: Clinical - Red Word Triage >> Sep 11, 2024  2:25 PM Sasha M wrote: Red Word that prompted transfer to Nurse Triage: sore throat for three weeks and not getting better Reason for Disposition  [1] Sore throat is the only symptom AND [2] present > 48 hours  Answer Assessment - Initial Assessment Questions No available appts today. Scheduled tomorrow, 09/12/24.  Advised UC/ED if symptoms worsen.  1. ONSET: When did the throat start hurting? (Hours or days ago)      3 weeks ago; denies any trouble swallowing, pain with swallowing right side of neck. 2. SEVERITY: How bad is the sore throat? (Scale 1-10; mild, moderate or severe)     7/10; cough drops; burning sensation 3. STREP EXPOSURE: Has there been any exposure to strep within the past week? If Yes, ask: What type of contact occurred?      no 4.  VIRAL SYMPTOMS: Are there any symptoms of a cold, such as a runny nose, cough, hoarse voice or red eyes?      no 5. FEVER: Do you have a fever? If Yes, ask: What is your temperature, how was it measured, and when did it start?     denies 6. PUS ON THE TONSILS: Is there pus on the tonsils in the back of your throat?     Redness, denies pus, swelling 7. OTHER SYMPTOMS: Do you have any other symptoms? (e.g., difficulty breathing, headache, rash)   Denies fever, chills, n/v Reports smoker  Protocols used: Sore Throat-A-AH

## 2024-09-11 NOTE — Consult Note (Signed)
 Consulting Department:  Emergency department  Primary Physician:  Norleen Lynwood ORN, MD  Chief Complaint: Fall with cranial trauma  History of Present Illness: 09/11/2024 LEAM MADERO is a 68 y.o. male who presents with the chief complaint of fall after syncope.  Struck his face on a bookcase.  Duration: *** Location: *** Quality: *** Provoking: aggravated by *** Alleviating: made better by *** Weakness: ***none Timing: ***none CSF Leak:   Review of Systems:  A 10 point review of systems is negative, except for the pertinent positives and negatives detailed in the HPI.  Past Medical History: Past Medical History:  Diagnosis Date   Adenomatous polyps 11/17/2011   Alcohol  use 11/17/2011   ALLERGIC RHINITIS 09/10/2007   ANXIETY 09/10/2007   BACK PAIN 12/31/2010   CHF (congestive heart failure) (HCC)    DISC DISEASE, LUMBAR 12/31/2010   GERD (gastroesophageal reflux disease) 11/23/2016   History of kidney cancer    Hyperglycemia 11/23/2016   HYPERLIPIDEMIA 12/31/2010   Hypertension    Personal history of rectal adenomas 01/29/2008   PSORIASIS 12/31/2010   Psoriatic arthritis (HCC)    PULMONARY EMBOLISM, HX OF 09/10/2007   RASH-NONVESICULAR 04/23/2009   Renal mass    SMOKER 12/31/2010    Past Surgical History: Past Surgical History:  Procedure Laterality Date   LYMPH NODE DISSECTION Bilateral 08/27/2020   Procedure: RETROPERITONEAL LYMPH NODE DISSECTION;  Surgeon: Alvaro Hummer, MD;  Location: [facility];  Service: Urology;  Laterality: Bilateral;   RIGHT/LEFT HEART CATH AND CORONARY ANGIOGRAPHY N/A 12/15/2021   Procedure: RIGHT/LEFT HEART CATH AND CORONARY ANGIOGRAPHY;  Surgeon: Darron Deatrice DELENA, MD;  Location: [facility];  Service: Cardiovascular;  Laterality: N/A;   ROBOT ASSISTED LAPAROSCOPIC NEPHRECTOMY Left 08/27/2020   Procedure: XI ROBOTIC ASSISTED LAPAROSCOPIC RADICAL NEPHRECTOMY;  Surgeon: Alvaro Hummer, MD;  Location: [facility];  Service: Urology;   Laterality: Left;  3.5 HRS   ROBOTIC ADRENALECTOMY Right 07/07/2022   Procedure: XI ROBOTIC PARTIAL ADRENALECTOMY, INTRAOPERATIVE ULTRASOUND;  Surgeon: Alvaro Hummer, MD;  Location: [facility];  Service: Urology;  Laterality: Right;   ROTATOR CUFF REPAIR  Jan, 2010   [facility], Left   spleen repair     TOTAL HIP ARTHROPLASTY Left 09/09/2017   Procedure: LEFT TOTAL HIP ARTHROPLASTY ANTERIOR APPROACH;  Surgeon: Yvone Norleen, MD;  Location: [facility];  Service: Orthopedics;  Laterality: Left;   WISDOM TOOTH EXTRACTION      Allergies: Allergies as of 09/11/2024   (No Known Allergies)    Medications: No current facility-administered medications for this encounter.  Current Outpatient Medications:    docusate sodium  (COLACE) 100 MG capsule, Take 1 capsule (100 mg total) by mouth 2 (two) times daily., Disp: , Rfl:    empagliflozin  (JARDIANCE ) 10 MG TABS tablet, Take 1 tablet (10 mg total) by mouth daily before breakfast., Disp: 90 tablet, Rfl: 3   Evolocumab  (REPATHA  SURECLICK) 140 MG/ML SOAJ, Inject 140 mg into the skin every 14 (fourteen) days., Disp: 6 mL, Rfl: 3   FLUoxetine (PROZAC) 20 MG capsule, TAKE ONE CAPSULE BY MOUTH EVERY DAY, Disp: 90 capsule, Rfl: 3   hydrocortisone  (CORTEF ) 20 MG tablet, Take 1 tablet (20 mg total) by mouth daily., Disp: 90 tablet, Rfl: 3   hydrocortisone  sodium succinate  (SOLU-CORTEF ) 100 MG injection, Inject 1 mL (50 mg total) into the muscle as directed as needed for 30 days., Disp: 1 each, Rfl: 11   metoprolol  succinate (TOPROL -XL) 100 MG 24 hr tablet, TAKE 1 TABLET BY MOUTH ONCE A DAY.  TAKE WITH OR IMMEDIATELY FOLLOWING A MEAL, Disp: 90 tablet, Rfl: 3   ondansetron  (ZOFRAN -ODT) 4 MG disintegrating tablet, Take 1 tablet (4 mg total) by mouth every 8 (eight) hours as needed for nausea or vomiting., Disp: 20 tablet, Rfl: 0   pantoprazole  (PROTONIX ) 40 MG tablet, Take 1 tablet (40 mg total) by mouth daily., Disp: 90 tablet, Rfl: 3   predniSONE  (DELTASONE ) 10 MG  tablet, 3 tabs by mouth per day for 3 days,2tabs per day for 3 days,1tab per day for 3 days, Disp: 18 tablet, Rfl: 0   Risankizumab-rzaa (SKYRIZI PEN [place]), Inject 180 mg into the skin every 3 (three) months. (Patient not taking: Reported on 07/12/2024), Disp: , Rfl:    sacubitril -valsartan  (ENTRESTO ) 24-26 MG, Take 1 tablet by mouth 2 (two) times daily., Disp: 180 tablet, Rfl: 3   spironolactone  (ALDACTONE ) 25 MG tablet, Take 0.5 tablets (12.5 mg total) by mouth daily., Disp: 45 tablet, Rfl: 3   SYRINGE-NEEDLE, DISP, 3 ML 22G X 1-1/2 3 ML MISC, Use to inject Solu-Cortef  soultion as directed. (Patient not taking: Reported on 07/12/2024), Disp: 25 each, Rfl: 11   Social History: Social History   Tobacco Use   Smoking status: Every Day    Current packs/day: 0.25    Average packs/day: 0.3 packs/day for 40.0 years (10.0 ttl pk-yrs)    Types: Cigarettes   Smokeless tobacco: Never   Tobacco comments:    Restarted in March 2021  Vaping Use   Vaping status: Never Used  Substance Use Topics   Alcohol  use: Yes    Alcohol /week: 21.0 standard drinks of alcohol     Types: 21 Standard drinks or equivalent per week    Comment: 3 [place] daily   Drug use: No    Family Medical History: Family History  Problem Relation Age of Onset   Heart attack Father    Colon cancer Neg Hx    Esophageal cancer Neg Hx    Rectal cancer Neg Hx    Stomach cancer Neg Hx     Physical Examination: Vitals:   09/11/24 1850 09/11/24 2000  BP: 108/88 95/69  Pulse: 86 84  Resp: 17 16  Temp: 97.6 F (36.4 C)   SpO2: 98% 96%     General: Patient is well developed, well nourished, calm, collected, and in no apparent distress.  NEUROLOGICAL:  General: In no acute distress.   Awake, alert, oriented to person, place, and time.  Pupils equal round and reactive to light.  Full Facial tone is symmetric.  Tongue protrusion is midline.  Bilateral upper extremities are full strength proximally and distally.  There is no  pronator drift.  Language is conversant.  GCS:***   Bilateral upper and lower extremity sensation is intact to light touch.  Imaging: CT HEAD WO CONTRAST Result Date: 09/11/2024 CLINICAL DATA:  Recent syncopal episode EXAM: CT HEAD WITHOUT CONTRAST CT MAXILLOFACIAL WITHOUT CONTRAST CT CERVICAL SPINE WITHOUT CONTRAST TECHNIQUE: Multidetector CT imaging of the head, cervical spine, and maxillofacial structures were performed using the standard protocol without intravenous contrast. Multiplanar CT image reconstructions of the cervical spine and maxillofacial structures were also generated. RADIATION DOSE REDUCTION: This exam was performed according to the departmental dose-optimization program which includes automated exposure control, adjustment of the mA and/or kV according to patient size and/or use of iterative reconstruction technique. COMPARISON:  02/29/2024 FINDINGS: CT HEAD FINDINGS Brain: Focal parenchymal hemorrhage is noted within the corpus callosum just to the right of the midline. This measures approximately 9 by  6 by 4 mm. This is best visualized on image number 21 of series 5 and image number 37 of series 3. No intraventricular extension is noted. Vascular: No hyperdense vessel or unexpected calcification. Skull: Normal. Negative for fracture or focal lesion. Other: None. CT MAXILLOFACIAL FINDINGS Osseous: No acute fracture is identified. Extensive dental hardware is noted. Orbits: Orbits and their contents are within normal limits. Sinuses: Clear. Soft tissues: Surrounding soft tissues show mild soft tissue swelling of the right cheek with considerable subcutaneous air identified consistent with the known puncture wound. CT CERVICAL SPINE FINDINGS Alignment: Within normal limits. Skull base and vertebrae: 7 cervical segments are well visualized. Vertebral body height is well maintained. Osteophytic changes are noted primarily at C5-6 and C6-7. Multilevel facet hypertrophic changes are noted.  No acute fracture or acute facet abnormality is noted. The odontoid is within normal limits. Soft tissues and spinal canal: Surrounding soft tissue structures are within normal limits. Upper chest: Visualized lung apices are unremarkable. Other: None IMPRESSION: CT of the head: Focus of acute hemorrhage within the body of the corpus callosum just to the right of the midline as described. CT of the maxillofacial bones: Soft tissue changes consistent with the known right cheek puncture wound. No fracture is seen. CT of the cervical spine: Degenerative change without acute abnormality. Critical Value/emergent results were called by telephone at the time of interpretation on 09/11/2024 at 7:34 pm to Dr. MICHAEL MIAN , who verbally acknowledged these results. Traumatic Brain Injury Risk Stratification Skull Fracture: No - Low/mBIG 1 Subdural Hematoma (SDH): No - Low Subarachnoid Hemorrhage [facility]): No Epidural Hematoma (EDH): No - Low/mBIG 1 Cerebral contusion, intra-axial, intraparenchymal Hemorrhage (IPH): 8mm plus - High/mBIG 3 Intraventricular Hemorrhage (IVH): No - Low/mBIG 1 Midline Shift > 1mm or Edema/effacement of sulci/vents: No - Low/mBIG 1 ---------------------------------------------------- Electronically Signed   By: Oneil Devonshire M.D.   On: 09/11/2024 19:37   CT CERVICAL SPINE WO CONTRAST Result Date: 09/11/2024 CLINICAL DATA:  Recent syncopal episode EXAM: CT HEAD WITHOUT CONTRAST CT MAXILLOFACIAL WITHOUT CONTRAST CT CERVICAL SPINE WITHOUT CONTRAST TECHNIQUE: Multidetector CT imaging of the head, cervical spine, and maxillofacial structures were performed using the standard protocol without intravenous contrast. Multiplanar CT image reconstructions of the cervical spine and maxillofacial structures were also generated. RADIATION DOSE REDUCTION: This exam was performed according to the departmental dose-optimization program which includes automated exposure control, adjustment of the mA and/or kV  according to patient size and/or use of iterative reconstruction technique. COMPARISON:  02/29/2024 FINDINGS: CT HEAD FINDINGS Brain: Focal parenchymal hemorrhage is noted within the corpus callosum just to the right of the midline. This measures approximately 9 by 6 by 4 mm. This is best visualized on image number 21 of series 5 and image number 37 of series 3. No intraventricular extension is noted. Vascular: No hyperdense vessel or unexpected calcification. Skull: Normal. Negative for fracture or focal lesion. Other: None. CT MAXILLOFACIAL FINDINGS Osseous: No acute fracture is identified. Extensive dental hardware is noted. Orbits: Orbits and their contents are within normal limits. Sinuses: Clear. Soft tissues: Surrounding soft tissues show mild soft tissue swelling of the right cheek with considerable subcutaneous air identified consistent with the known puncture wound. CT CERVICAL SPINE FINDINGS Alignment: Within normal limits. Skull base and vertebrae: 7 cervical segments are well visualized. Vertebral body height is well maintained. Osteophytic changes are noted primarily at C5-6 and C6-7. Multilevel facet hypertrophic changes are noted. No acute fracture or acute facet abnormality is noted. The odontoid is  within normal limits. Soft tissues and spinal canal: Surrounding soft tissue structures are within normal limits. Upper chest: Visualized lung apices are unremarkable. Other: None IMPRESSION: CT of the head: Focus of acute hemorrhage within the body of the corpus callosum just to the right of the midline as described. CT of the maxillofacial bones: Soft tissue changes consistent with the known right cheek puncture wound. No fracture is seen. CT of the cervical spine: Degenerative change without acute abnormality. Critical Value/emergent results were called by telephone at the time of interpretation on 09/11/2024 at 7:34 pm to Dr. MICHAEL MIAN , who verbally acknowledged these results. Traumatic Brain  Injury Risk Stratification Skull Fracture: No - Low/mBIG 1 Subdural Hematoma (SDH): No - Low Subarachnoid Hemorrhage [facility]): No Epidural Hematoma (EDH): No - Low/mBIG 1 Cerebral contusion, intra-axial, intraparenchymal Hemorrhage (IPH): 8mm plus - High/mBIG 3 Intraventricular Hemorrhage (IVH): No - Low/mBIG 1 Midline Shift > 1mm or Edema/effacement of sulci/vents: No - Low/mBIG 1 ---------------------------------------------------- Electronically Signed   By: Oneil Devonshire M.D.   On: 09/11/2024 19:37   CT MAXILLOFACIAL WO CONTRAST Result Date: 09/11/2024 CLINICAL DATA:  Recent syncopal episode EXAM: CT HEAD WITHOUT CONTRAST CT MAXILLOFACIAL WITHOUT CONTRAST CT CERVICAL SPINE WITHOUT CONTRAST TECHNIQUE: Multidetector CT imaging of the head, cervical spine, and maxillofacial structures were performed using the standard protocol without intravenous contrast. Multiplanar CT image reconstructions of the cervical spine and maxillofacial structures were also generated. RADIATION DOSE REDUCTION: This exam was performed according to the departmental dose-optimization program which includes automated exposure control, adjustment of the mA and/or kV according to patient size and/or use of iterative reconstruction technique. COMPARISON:  02/29/2024 FINDINGS: CT HEAD FINDINGS Brain: Focal parenchymal hemorrhage is noted within the corpus callosum just to the right of the midline. This measures approximately 9 by 6 by 4 mm. This is best visualized on image number 21 of series 5 and image number 37 of series 3. No intraventricular extension is noted. Vascular: No hyperdense vessel or unexpected calcification. Skull: Normal. Negative for fracture or focal lesion. Other: None. CT MAXILLOFACIAL FINDINGS Osseous: No acute fracture is identified. Extensive dental hardware is noted. Orbits: Orbits and their contents are within normal limits. Sinuses: Clear. Soft tissues: Surrounding soft tissues show mild soft tissue swelling of the  right cheek with considerable subcutaneous air identified consistent with the known puncture wound. CT CERVICAL SPINE FINDINGS Alignment: Within normal limits. Skull base and vertebrae: 7 cervical segments are well visualized. Vertebral body height is well maintained. Osteophytic changes are noted primarily at C5-6 and C6-7. Multilevel facet hypertrophic changes are noted. No acute fracture or acute facet abnormality is noted. The odontoid is within normal limits. Soft tissues and spinal canal: Surrounding soft tissue structures are within normal limits. Upper chest: Visualized lung apices are unremarkable. Other: None IMPRESSION: CT of the head: Focus of acute hemorrhage within the body of the corpus callosum just to the right of the midline as described. CT of the maxillofacial bones: Soft tissue changes consistent with the known right cheek puncture wound. No fracture is seen. CT of the cervical spine: Degenerative change without acute abnormality. Critical Value/emergent results were called by telephone at the time of interpretation on 09/11/2024 at 7:34 pm to Dr. MICHAEL MIAN , who verbally acknowledged these results. Traumatic Brain Injury Risk Stratification Skull Fracture: No - Low/mBIG 1 Subdural Hematoma (SDH): No - Low Subarachnoid Hemorrhage [facility]): No Epidural Hematoma (EDH): No - Low/mBIG 1 Cerebral contusion, intra-axial, intraparenchymal Hemorrhage (IPH): 8mm plus - High/mBIG 3  Intraventricular Hemorrhage (IVH): No - Low/mBIG 1 Midline Shift > 1mm or Edema/effacement of sulci/vents: No - Low/mBIG 1 ---------------------------------------------------- Electronically Signed   By: Oneil Devonshire M.D.   On: 09/11/2024 19:37     I have personally reviewed the images and agree with the above interpretation.  Labs:    Latest Ref Rng & Units 09/11/2024    6:53 PM 03/02/2024    9:31 AM 02/29/2024   12:05 PM  CBC  WBC 4.0 - 10.5 K/uL 8.2  3.6  5.5   Hemoglobin 13.0 - 17.0 g/dL 86.0  87.2  84.3    Hematocrit 39.0 - 52.0 % 40.6  34.1  RESULTS UNAVAILABLE DUE TO INTERFERING SUBSTANCE   Platelets 150 - 400 K/uL 149  141  144       Latest Ref Rng & Units 09/11/2024    6:53 PM 03/06/2024    9:16 AM 03/02/2024   11:08 AM  BMP  Glucose 70 - 99 mg/dL 89  94  841   BUN 8 - 23 mg/dL 19  10  13    Creatinine 0.61 - 1.24 mg/dL 8.79  8.87  8.86   Sodium 135 - 145 mmol/L 131  135  128   Potassium 3.5 - 5.1 mmol/L 4.4  4.9  4.0   Chloride 98 - 111 mmol/L 99  98  97   CO2 22 - 32 mmol/L 22  31  24    Calcium 8.9 - 10.3 mg/dL 8.4  9.7  8.7         Assessment and Plan: Mr. Majid is a pleasant 68 y.o. male with recent syncope and facial trauma.  He is brought to the emergency department for evaluation.  Found to have a small corpus callosum region hemorrhage.  He has not had any seizures.  No new numbness weakness or tingling.  Does not have any evidence of CSF leak.  I reviewed his imaging, no evidence of cervical spinal fracture.  CT scan of the head demonstrates a small corpus callosum region fracture likely given his history was traumatic in origin.  Plan to have a follow-up CT scan in approximately 6 hours.  Hold any anticoagulation.  Given the traumatic nature of his hemorrhage would recommend 7 days of Keppra, 500 twice daily.  I have discussed the condition with the patient, including showing the radiographs and discussing treatment options in layman's terms.  The patient may benefit from conservative management.  Thus, I have recommended the following: ***.  We will plan to arrange follow-up in the neurosurgery clinic as an outpatient***.    Penne MICAEL Sharps, MD/MSCR Dept. of Neurosurgery

## 2024-09-11 NOTE — ED Notes (Signed)
 First Nurse Note: Pt to ED via [facility] from home for syncopal episode with fall. Pt has a puncture wound on right cheek. Pt was hypotensive on EMS arrival, Bp 88/60. Pt was given 500 NS bolus and 500 LR bolus. Pts Bp can up to 112/70. Pt has 18 G IV in the left fore arm.  HR- 98  CBG 98

## 2024-09-11 NOTE — ED Triage Notes (Signed)
 Patient to ED via [facility] for syncope. PT reports standing up too quickly and passing out hitting face on bookcase. Puncture wound to right cheek. Denies any other pain. Does not take blood thinners. Hx Addison's disease  18 L wrist

## 2024-09-12 ENCOUNTER — Encounter: Payer: Self-pay | Admitting: Internal Medicine

## 2024-09-12 ENCOUNTER — Ambulatory Visit (INDEPENDENT_AMBULATORY_CARE_PROVIDER_SITE_OTHER): Admitting: Internal Medicine

## 2024-09-12 ENCOUNTER — Emergency Department

## 2024-09-12 VITALS — BP 98/64 | HR 98 | Temp 98.2°F | Ht 74.0 in | Wt 217.5 lb

## 2024-09-12 DIAGNOSIS — S06361A Traumatic hemorrhage of cerebrum, unspecified, with loss of consciousness of 30 minutes or less, initial encounter: Secondary | ICD-10-CM

## 2024-09-12 DIAGNOSIS — R55 Syncope and collapse: Secondary | ICD-10-CM | POA: Diagnosis not present

## 2024-09-12 DIAGNOSIS — E785 Hyperlipidemia, unspecified: Secondary | ICD-10-CM | POA: Diagnosis not present

## 2024-09-12 DIAGNOSIS — I10 Essential (primary) hypertension: Secondary | ICD-10-CM | POA: Diagnosis not present

## 2024-09-12 DIAGNOSIS — J069 Acute upper respiratory infection, unspecified: Secondary | ICD-10-CM

## 2024-09-12 DIAGNOSIS — S0181XA Laceration without foreign body of other part of head, initial encounter: Secondary | ICD-10-CM

## 2024-09-12 DIAGNOSIS — S0181XD Laceration without foreign body of other part of head, subsequent encounter: Secondary | ICD-10-CM | POA: Diagnosis not present

## 2024-09-12 MED ORDER — PROMETHAZINE-DM 6.25-15 MG/5ML PO SYRP
5.0000 mL | ORAL_SOLUTION | Freq: Four times a day (QID) | ORAL | 0 refills | Status: AC | PRN
Start: 2024-09-12 — End: ?

## 2024-09-12 MED ORDER — OXYCODONE HCL 5 MG PO TABS
5.0000 mg | ORAL_TABLET | Freq: Once | ORAL | Status: AC
Start: 2024-09-12 — End: 2024-09-12
  Administered 2024-09-12: 5 mg via ORAL
  Filled 2024-09-12: qty 1

## 2024-09-12 MED ORDER — REPATHA SURECLICK 140 MG/ML ~~LOC~~ SOAJ: 140.0000 mg | SUBCUTANEOUS | 3 refills | Status: DC

## 2024-09-12 MED ORDER — AZITHROMYCIN 250 MG PO TABS: ORAL_TABLET | ORAL | 1 refills | Status: AC

## 2024-09-12 MED ORDER — TETANUS-DIPHTH-ACELL PERTUSSIS 5-2-15.5 LF-MCG/0.5 IM SUSP
0.5000 mL | Freq: Once | INTRAMUSCULAR | Status: AC
  Administered 2024-09-12: 0.5 mL via INTRAMUSCULAR
  Filled 2024-09-12: qty 0.5

## 2024-09-12 MED ORDER — ACETAMINOPHEN 500 MG PO TABS: 1000.0000 mg | ORAL_TABLET | Freq: Four times a day (QID) | ORAL | 2 refills | Status: DC | PRN

## 2024-09-12 MED ORDER — OXYCODONE HCL 5 MG PO TABS
5.0000 mg | ORAL_TABLET | Freq: Once | ORAL | Status: AC
  Administered 2024-09-12: 5 mg via ORAL
  Filled 2024-09-12: qty 1

## 2024-09-12 MED ORDER — SODIUM CHLORIDE 0.9 % IV BOLUS
250.0000 mL | Freq: Once | INTRAVENOUS | Status: AC
  Administered 2024-09-12: 250 mL via INTRAVENOUS

## 2024-09-12 MED ORDER — LEVETIRACETAM 500 MG PO TABS: 500.0000 mg | ORAL_TABLET | Freq: Two times a day (BID) | ORAL | 0 refills | Status: DC

## 2024-09-12 NOTE — ED Provider Notes (Signed)
 CT stable on repeat imaging.  Patient stable on reassessment very eager to go home.  Talked with Dr. Penne Sharps of neurosurgery who reviewed repeat imaging, plan for discharge on Keppra, follow-up with neurosurgery clinic.  Discharge.   Cyrena Mylar, MD 09/12/24 [phone]

## 2024-09-12 NOTE — ED Notes (Signed)
 Wife at bedside ready to take patient home. Patient able to ambulate without assistance steady gait.

## 2024-09-12 NOTE — Assessment & Plan Note (Signed)
 Currently low normal, overcontrolled on entresto , toprol  xl, aldactone  without evidence for low volume - ok for now to take Half toprol  xl = 50 mg only and continue to monitor for BP with all other continued; I would hope the BP may be lower for the next 2-3 days , and should consider increased toprol  xl 100 mg again for SBP consistently > 140

## 2024-09-12 NOTE — Discharge Instructions (Signed)
 Your evaluation in the emergency department was notable for moment of bleeding in your brain, you were evaluated by a neurosurgeon for this.  Your repeat head CT was stable, and we have discharged you with a medication to prevent seizures--please take this as prescribed.  Neurosurgeon would like to follow-up with you in clinic--they will contact you to schedule this.  Your facial laceration was closed with 3 external sutures which should be removed in about 5-7 days and 1 internal suture that does not need to be removed.  You can take Tylenol  as needed for any ongoing discomfort.  Please do follow-up closely with your primary care provider for reevaluation, and return to the emergency department with any new or worsening symptoms.

## 2024-09-12 NOTE — Progress Notes (Signed)
 Patient ID: Randy Walker, male   DOB: Apr 26, 1956, 68 y.o.   MRN: [id number]        Chief Complaint: follow up acute upper resp infection, low BP, hld, syncope, right facial laceration       HPI:  Randy Walker is a 68 y.o. male here  with wife, here with 2-3 days acute onset fever, facial pain, pressure, headache, general weakness and malaise,, with mild to mod ST and cough, but pt denies chest pain, wheezing, increased sob or doe, orthopnea, PND, increased LE swelling, palpitations, except unfortunately yesterday suffered another episode of syncope, this one with being down unconscious for about 10 min per pt, after standing too quickly    Seen at ED - CT head repeat x 2 neg for stroke or other, MI ruled out, cbc and UA neg, sodium 131 chronic stable, Cr and LFTs negative for acute.   Pt followed per cardiology yearly in [place], not seen for almost a year now.  Does have significant Low BP today and wife asking for how to reduce meds and is quite able to check BP at home in follow up.  Pt has not started repatha  yet, but now willing.  Also has right facial laceration with stitches today with mild to mod swelling and laceration intact, no s/s infection.  Pt encouraged to f/u with neurosurgury and continue keppra       Wt Readings from Last 3 Encounters:  09/12/24 217 lb 8 oz (98.7 kg)  09/11/24 185 lb (83.9 kg)  07/12/24 206 lb 6.4 oz (93.6 kg)   BP Readings from Last 3 Encounters:  09/12/24 98/64  09/12/24 102/71  07/12/24 126/82         Past Medical History:  Diagnosis Date   Adenomatous polyps 11/17/2011   Alcohol  use 11/17/2011   ALLERGIC RHINITIS 09/10/2007   ANXIETY 09/10/2007   BACK PAIN 12/31/2010   CHF (congestive heart failure) (HCC)    DISC DISEASE, LUMBAR 12/31/2010   GERD (gastroesophageal reflux disease) 11/23/2016   History of kidney cancer    Hyperglycemia 11/23/2016   HYPERLIPIDEMIA 12/31/2010   Hypertension    Personal history of rectal adenomas 01/29/2008    PSORIASIS 12/31/2010   Psoriatic arthritis (HCC)    PULMONARY EMBOLISM, HX OF 09/10/2007   RASH-NONVESICULAR 04/23/2009   Renal mass    SMOKER 12/31/2010   Past Surgical History:  Procedure Laterality Date   LYMPH NODE DISSECTION Bilateral 08/27/2020   Procedure: RETROPERITONEAL LYMPH NODE DISSECTION;  Surgeon: Alvaro Hummer, MD;  Location: [facility];  Service: Urology;  Laterality: Bilateral;   RIGHT/LEFT HEART CATH AND CORONARY ANGIOGRAPHY N/A 12/15/2021   Procedure: RIGHT/LEFT HEART CATH AND CORONARY ANGIOGRAPHY;  Surgeon: Darron Deatrice DELENA, MD;  Location: [facility];  Service: Cardiovascular;  Laterality: N/A;   ROBOT ASSISTED LAPAROSCOPIC NEPHRECTOMY Left 08/27/2020   Procedure: XI ROBOTIC ASSISTED LAPAROSCOPIC RADICAL NEPHRECTOMY;  Surgeon: Alvaro Hummer, MD;  Location: [facility];  Service: Urology;  Laterality: Left;  3.5 HRS   ROBOTIC ADRENALECTOMY Right 07/07/2022   Procedure: XI ROBOTIC PARTIAL ADRENALECTOMY, INTRAOPERATIVE ULTRASOUND;  Surgeon: Alvaro Hummer, MD;  Location: [facility];  Service: Urology;  Laterality: Right;   ROTATOR CUFF REPAIR  Jan, 2010   [facility], Left   spleen repair     TOTAL HIP ARTHROPLASTY Left 09/09/2017   Procedure: LEFT TOTAL HIP ARTHROPLASTY ANTERIOR APPROACH;  Surgeon: Yvone Rush, MD;  Location: [facility];  Service: Orthopedics;  Laterality: Left;   WISDOM TOOTH EXTRACTION  reports that he has been smoking cigarettes. He has a 10 pack-year smoking history. He has never used smokeless tobacco. He reports current alcohol  use of about 21.0 standard drinks of alcohol  per week. He reports that he does not use drugs. family history includes Heart attack in his father. No Known Allergies Current Outpatient Medications on File Prior to Visit  Medication Sig Dispense Refill   acetaminophen  (TYLENOL ) 500 MG tablet Take 2 tablets (1,000 mg total) by mouth every 6 (six) hours as needed. 100 tablet 2   docusate sodium  (COLACE) 100 MG capsule Take 1  capsule (100 mg total) by mouth 2 (two) times daily.     empagliflozin  (JARDIANCE ) 10 MG TABS tablet Take 1 tablet (10 mg total) by mouth daily before breakfast. 90 tablet 3   FLUoxetine (PROZAC) 20 MG capsule TAKE ONE CAPSULE BY MOUTH EVERY DAY 90 capsule 3   hydrocortisone  (CORTEF ) 20 MG tablet Take 1 tablet (20 mg total) by mouth daily. 90 tablet 3   hydrocortisone  sodium succinate  (SOLU-CORTEF ) 100 MG injection Inject 1 mL (50 mg total) into the muscle as directed as needed for 30 days. 1 each 11   levETIRAcetam (KEPPRA) 500 MG tablet Take 1 tablet (500 mg total) by mouth 2 (two) times daily for 7 days. 14 tablet 0   metoprolol  succinate (TOPROL -XL) 100 MG 24 hr tablet TAKE 1 TABLET BY MOUTH ONCE A DAY. TAKE WITH OR IMMEDIATELY FOLLOWING A MEAL 90 tablet 3   ondansetron  (ZOFRAN -ODT) 4 MG disintegrating tablet Take 1 tablet (4 mg total) by mouth every 8 (eight) hours as needed for nausea or vomiting. 20 tablet 0   pantoprazole  (PROTONIX ) 40 MG tablet Take 1 tablet (40 mg total) by mouth daily. 90 tablet 3   predniSONE  (DELTASONE ) 10 MG tablet 3 tabs by mouth per day for 3 days,2tabs per day for 3 days,1tab per day for 3 days 18 tablet 0   sacubitril -valsartan  (ENTRESTO ) 24-26 MG Take 1 tablet by mouth 2 (two) times daily. 180 tablet 3   spironolactone  (ALDACTONE ) 25 MG tablet Take 0.5 tablets (12.5 mg total) by mouth daily. 45 tablet 3   SYRINGE-NEEDLE, DISP, 3 ML 22G X 1-1/2 3 ML MISC Use to inject Solu-Cortef  soultion as directed. (Patient not taking: Reported on 09/12/2024) 25 each 11   No current facility-administered medications on file prior to visit.        ROS:  All others reviewed and negative.  Objective        PE:  BP 98/64   Pulse 98   Temp 98.2 F (36.8 C) (Temporal)   Ht 6' 2 (1.88 m)   Wt 217 lb 8 oz (98.7 kg)   SpO2 96%   BMI 27.93 kg/m                 Constitutional: Pt appears in NAD               HENT: Head: NCAT.                Right Ear: External ear normal.                  Left Ear: External ear normal. Bilat tm's with mild erythema.  Max sinus areas mild tender.  Pharynx with mild erythema, no exudate               Eyes: . Pupils are equal, round, and reactive to light. Conjunctivae and EOM are normal  Nose: without d/c or deformity               Neck: Neck supple. Gross normal ROM               Cardiovascular: Normal rate and regular rhythm.                 Pulmonary/Chest: Effort normal and breath sounds without rales or wheezing.                Abd:  Soft, NT, ND, + BS, no organomegaly               Neurological: Pt is alert. At baseline orientation, motor grossly intact               Skin: Skin is warm,  LE edema - none, right face with healing laceration               Psychiatric: Pt behavior is normal without agitation   Micro: none  Cardiac tracings I have personally interpreted today:  none  Pertinent Radiological findings (summarize): none   Lab Results  Component Value Date   WBC 8.2 09/11/2024   HGB 13.9 09/11/2024   HCT 40.6 09/11/2024   PLT 149 (L) 09/11/2024   GLUCOSE 89 09/11/2024   CHOL 170 10/06/2021   TRIG 127 10/06/2021   HDL 55 10/06/2021   LDLDIRECT 90.0 10/07/2020   LDLCALC 93 10/06/2021   ALT 21 09/11/2024   AST 21 09/11/2024   NA 131 (L) 09/11/2024   K 4.4 09/11/2024   CL 99 09/11/2024   CREATININE 1.20 09/11/2024   BUN 19 09/11/2024   CO2 22 09/11/2024   TSH 2.310 10/06/2021   PSA 0.33 10/07/2020   INR 0.94 09/05/2017   HGBA1C 5.5 03/06/2024   Assessment/Plan:  MARCELLA DUNNAWAY is a 68 y.o. White or Caucasian [1] male with  has a past medical history of Adenomatous polyps (11/17/2011), Alcohol  use (11/17/2011), ALLERGIC RHINITIS (09/10/2007), ANXIETY (09/10/2007), BACK PAIN (12/31/2010), CHF (congestive heart failure) (HCC), DISC DISEASE, LUMBAR (12/31/2010), GERD (gastroesophageal reflux disease) (11/23/2016), History of kidney cancer, Hyperglycemia (11/23/2016), HYPERLIPIDEMIA  (12/31/2010), Hypertension, Personal history of rectal adenomas (01/29/2008), PSORIASIS (12/31/2010), Psoriatic arthritis (HCC), PULMONARY EMBOLISM, HX OF (09/10/2007), RASH-NONVESICULAR (04/23/2009), Renal mass, and SMOKER (12/31/2010).  Hypertension Currently low normal, overcontrolled on entresto , toprol  xl, aldactone  without evidence for low volume - ok for now to take Half toprol  xl = 50 mg only and continue to monitor for BP with all other continued; I would hope the BP may be lower for the next 2-3 days , and should consider increased toprol  xl 100 mg again for SBP consistently > 140  Syncope Pt relates several episodes recently, and yesterday more dramatic with unconscious for 10 min, no sign of seizure activity; pt should f/u with cardiology sooner than planned - referral placed, pt also to f/u with Neurosurgury, continue keppra.  Laceration of face without complication With right facial swelling after laceration requiring stitches yesterday - no sign of infection, cont to follow,   HLD (hyperlipidemia) Lab Results  Component Value Date   LDLCALC 93 10/06/2021   uncontrolled, pt to start repatha  140 mg biweekly   Acute upper respiratory infection Mild to mod, for antibx course zpack, and cough med prn,  to f/u any worsening symptoms or concerns  Followup: Return in about 6 months (around 03/13/2025).  Lynwood Rush, MD 09/12/2024 1:12 PM [facility] Internal  Medicine

## 2024-09-12 NOTE — Assessment & Plan Note (Addendum)
 Pt relates several episodes recently, and yesterday more dramatic with unconscious for 10 min, no sign of seizure activity; pt should f/u with cardiology sooner than planned - referral placed, pt also to f/u with Neurosurgury, continue keppra.

## 2024-09-12 NOTE — ED Notes (Signed)
 Patient transported to CT

## 2024-09-12 NOTE — Assessment & Plan Note (Signed)
Mild to mod, for antibx course zpack, and cough med prn,  to f/u any worsening symptoms or concerns

## 2024-09-12 NOTE — Assessment & Plan Note (Signed)
 Lab Results  Component Value Date   LDLCALC 93 10/06/2021   uncontrolled, pt to start repatha  140 mg biweekly

## 2024-09-12 NOTE — Patient Instructions (Signed)
 Please take all new medication as prescribed - the antibiotic, cough medicine  Ok to decrease the toprol  xl from 100 to 50 mg per day but continue to monitor the BP at home, and increase again if the top number (systole) is over 140 (though the heart doctors like to keep the BP < 130)  Please continue all other medications as before, including ok to start the Repatha  for cholesterol  Please have the pharmacy call with any other refills you may need.  Please keep your appointments with your specialists as you may have planned  You will be contacted regarding the referral for: Cardiology - [place]  We can hold on more lab tests today  Please make an Appointment to return in 6 months, or sooner if needed

## 2024-09-12 NOTE — Assessment & Plan Note (Addendum)
 With right facial swelling after laceration requiring stitches yesterday - no sign of infection, cont to follow,

## 2024-09-18 ENCOUNTER — Encounter: Payer: Self-pay | Admitting: Physician Assistant

## 2024-09-18 ENCOUNTER — Ambulatory Visit: Attending: Physician Assistant | Admitting: Physician Assistant

## 2024-09-18 ENCOUNTER — Ambulatory Visit

## 2024-09-18 VITALS — BP 112/79 | HR 104 | Ht 74.0 in | Wt 209.4 lb

## 2024-09-18 DIAGNOSIS — I5022 Chronic systolic (congestive) heart failure: Secondary | ICD-10-CM

## 2024-09-18 DIAGNOSIS — F1090 Alcohol use, unspecified, uncomplicated: Secondary | ICD-10-CM

## 2024-09-18 DIAGNOSIS — I77810 Thoracic aortic ectasia: Secondary | ICD-10-CM

## 2024-09-18 DIAGNOSIS — R55 Syncope and collapse: Secondary | ICD-10-CM

## 2024-09-18 DIAGNOSIS — Z720 Tobacco use: Secondary | ICD-10-CM

## 2024-09-18 DIAGNOSIS — I10 Essential (primary) hypertension: Secondary | ICD-10-CM

## 2024-09-18 DIAGNOSIS — E782 Mixed hyperlipidemia: Secondary | ICD-10-CM

## 2024-09-18 NOTE — Patient Instructions (Signed)
 Medication Instructions:  Your physician recommends the following medication changes.  STOP TAKING: Entresto   *If you need a refill on your cardiac medications before your next appointment, please call your pharmacy*  Lab Work: None ordered at this time  If you have labs (blood work) drawn today and your tests are completely normal, you will receive your results only by: MyChart Message (if you have MyChart) OR A paper copy in the mail If you have any lab test that is abnormal or we need to change your treatment, we will call you to review the results.  Testing/Procedures: Your physician has requested that you have an echocardiogram. Echocardiography is a painless test that uses sound waves to create images of your heart. It provides your doctor with information about the size and shape of your heart and how well your heart's chambers and valves are working.   You may receive an ultrasound enhancing agent through an IV if needed to better visualize your heart during the echo. This procedure takes approximately one hour.  There are no restrictions for this procedure.  This will take place at [address] [facility] [address], [place] [address]  Please note: We ask at that you not bring children with you during ultrasound (echo/ vascular) testing. Due to room size and safety concerns, children are not allowed in the ultrasound rooms during exams. Our front office staff cannot provide observation of children in our lobby area while testing is being conducted. An adult accompanying a patient to their appointment will only be allowed in the ultrasound room at the discretion of the ultrasound technician under special circumstances. We apologize for any inconvenience.  ZIO AT Long term monitor-Live Telemetry  Your physician has requested you wear a ZIO patch monitor for 14 days.  This is a single patch monitor. [employer] supplies one patch monitor per enrollment.  Additional stickers  are not available.  Please do not apply patch if you will be having a Nuclear Stress Test, Echocardiogram, Cardiac CT, MRI, or Chest Xray during the period you would be wearing the monitor.  The patch cannot be worn during these tests.  You cannot remove and re-apply the ZIO AT patch monitor.  Your ZIO patch monitor will be mailed 3 day USPS to your address on file. It may take 3-5 days to receive your monitor after you have been enrolled.  Once you have received your monitor, please review the enclosed instructions. Your monitor has already been registered assigning a specific monitor serial number to you.   Billing and Patient Assistance Program information  [place] has been supplied with any insurance information on record for billing.  [employer] offers a sliding scale Patient Assistance Program for patients without insurance, or whose insurance does not completely cover the cost of the ZIO patch monitor.  You must apply for the Patient Assistance Program to qualify for the discounted rate.  To apply, call [employer] at [phone], select option 4, select option 2 , ask to apply for the Patient Assistance Program, (you can request an interpreter if needed).  [employer] will ask your household income and how many people are in your household.  [employer] will quote your out-of-pocket cost based on this information. They will also be able to set up a 12 month interest free payment plan if needed.  Applying the monitor   Shave hair from upper left chest.  Hold the abrader disc by orange tab. Rub the abrader in 40 strokes over left upper chest as  indicated in your monitor instructions.  Clean area with 4 enclosed alcohol  pads. Use all pads to ensure the area is cleaned thoroughly. Let dry.  Apply patch as indicated in monitor instructions. Patch will be placed under collarbone on left side of chest with arrow pointing upward.  Rub patch adhesive wings for 2 minutes. Remove the white label marked 1. Remove the  white label marked 2. Rub patch adhesive wings for 2 additional minutes.  While looking in a mirror, press and release button in center of patch. A small green light will flash 3-4 times.  This will be your only indicator that the monitor has been turned on.   Do not shower for the first 24 hours.  You may shower after the first 24 hours.  Press the button if you feel a symptom.  You will hear a small click.  Record Date, Time and Symptom in the Patient Log.   Starting the Gateway  In your kit there is a audiological scientist box the size of a cellphone.  This is [employer]. It transmits all your recorded data to [facility].  This box must always stay within 10 feet of you.  Open the box and push the * button.  There will be a light that blinks orange and then green a few times.  When the light stops blinking, the Gateway is connected to the ZIO patch. Call [employer] at [phone] to confirm your monitor is transmitting.  Returning your monitor  Remove your patch and place it inside the Gateway.  In the lower half of the Gateway box there is a white bag with prepaid postage on it.  Place Gateway in bag and seal.  Mail package back to [place] as soon as possible.  Your physician should have your final report approximately 7-14 days after you have mailed back your monitor. Call [facility] Customer Care at [phone] if you have questions regarding your ZIO AT patch monitor.   Call them immediately if you see an orange light blinking on your monitor.  If your monitor falls off in less than 4 days, contact our Monitor department at [phone]. If your monitor becomes loose or falls off after 4 days call [employer] at [phone] for suggestions on securing your monitor.  BP LOG given  Follow-Up: At [facility], you and your health needs are our priority.  As part of our continuing mission to provide you with exceptional heart care, our providers are all part of one team.  This team  includes your primary Cardiologist (physician) and Advanced Practice Providers or APPs (Physician Assistants and Nurse Practitioners) who all work together to provide you with the care you need, when you need it.  Your next appointment:   6-8 week(s)  Provider:   You may see Deatrice Cage, MD or one of the following Advanced Practice Providers on your designated Care Team:   Lonni Meager, NP Lesley Maffucci, PA-C Bernardino Bring, PA-C Cadence Truro, PA-C Tylene Lunch, NP Barnie Hila, NP

## 2024-09-18 NOTE — Progress Notes (Signed)
 Cardiology Office Note    Date:  09/18/2024   ID:  Randy Walker, Randy Walker 1956/09/26, MRN [id number]  PCP:  Norleen Lynwood ORN, MD  Cardiologist:  Deatrice Cage, MD  Electrophysiologist:  None   Chief Complaint: ED follow-up  History of Present Illness:   Randy Walker is a 68 y.o. male with history of hypertension, hyperlipidemia, HFmrEF, nonischemic cardiomyopathy, left renal cancer s/p nephrectomy, tobacco use, alcohol  use, and mild aortic aneurysm who presents for ED follow-up.    Patient was initially evaluated by Dr. Cage 09/2021 after referral for a mildly dilated ascending aorta.  He had a calcium score 08/2021 which was 29.  Ascending aorta was noted to be mildly dilated at 42 mm.  He reported exertional dyspnea and mild tachycardia was noted.  Echo 10/2021 showed EF 40 to 45% with global hypokinesis, G2 DD, mild MR, mild to moderate AI, and mild dilation of the aortic root at 43 mm.  He was started on metoprolol  succinate 25 mg daily.  Right and left heart cardiac catheterization 11/2021 revealed normal coronary arteries, mildly reduced LV systolic function with EF 45%, and normal filling pressures with normal pulmonary pressure and normal cardiac output.  He was seen in follow-up 02/2022 and overall doing well.  He was transition from losartan  to Entresto  in addition to Toprol  and Jardiance .  Limited echo 05/2022 showed EF 40 to 45% with global hypokinesis.  He was seen in follow-up 04/2023 and reported running out of his cardiac medications 1 to 2 months prior.  Medications were refilled.  CTA chest aorta showed stable mildly dilated ascending aortic aneurysm of 4.1 cm with recommendation for annual imaging.   Patient was most recently seen 05/2024 overall doing well from a cardiac perspective.  He had recently been admitted 02/2024 for adrenal insufficiency for which he was started on chronic steroids.  He was euvolemic on exam with no further testing or medication changes indicated at that  time.  Patient was seen in the ED 09/11/2024 after a syncopal episode.  He reported standing up too fast, feeling lightheaded, and passing out hitting his face on a bookshelf.  Unconscious for approximately 10 minutes.  Noted to have a puncture wound to the right cheek.  No seizure-like activity per wife.  Lab work largely unremarkable.  Troponin negative x 2.  EKG without acute ischemic changes.  CT head showed hemorrhage within the corpus callosum.  Neurosurgery was consulted and recommended repeat CT which was stable.  He was started on Keppra 500 mg twice daily for 1 week and recommended to follow-up with neurosurgery.  Patient was seen in follow-up with PCP 09/12/2024 and noted fever, facial pain, pressure, headache, generalized weakness, and malaise with mild to moderate cough.  Blood pressure was noted to be low at 98/64.  Metoprolol  was reduced from 100 mg to 50 mg daily.  He was started on Repatha .  Also diagnosed with acute upper respiratory infection and started on antibiotics.  Patient presents today with concern for multiple syncopal episodes and lightheadedness. He reports 2-3 months of worsening lightheadedness, particularly when going from a sitting to standing position but occasionally at rest. He reports 3 episodes of syncope in the past month. The most recent episode was the worst because he hit his head and was unconscious for 10 minutes. Since discharge from the ED, he reports ongoing lightheadedness. He also reports worsening dyspnea on exertion with occasional symptoms at rest. He denies chest pain, palpitations, orthopnea, PND, and lower  extremity swelling. He reports not drinking much water  during the day.   Labs independently reviewed: 08/2024-Hgb 13.9, HCT 40.6, platelets 149, sodium 131, potassium 4.4, BUN 19, creatinine 1.2, normal LFTs 03/2024-TC 194, TG 153, HDL 82, LDL 86  Objective   Past Medical History:  Diagnosis Date   Adenomatous polyps 11/17/2011   Alcohol  use  11/17/2011   ALLERGIC RHINITIS 09/10/2007   ANXIETY 09/10/2007   BACK PAIN 12/31/2010   CHF (congestive heart failure) (HCC)    DISC DISEASE, LUMBAR 12/31/2010   GERD (gastroesophageal reflux disease) 11/23/2016   History of kidney cancer    Hyperglycemia 11/23/2016   HYPERLIPIDEMIA 12/31/2010   Hypertension    Personal history of rectal adenomas 01/29/2008   PSORIASIS 12/31/2010   Psoriatic arthritis (HCC)    PULMONARY EMBOLISM, HX OF 09/10/2007   RASH-NONVESICULAR 04/23/2009   Renal mass    SMOKER 12/31/2010    Current Medications: Current Meds  Medication Sig   acetaminophen  (TYLENOL ) 500 MG tablet Take 2 tablets (1,000 mg total) by mouth every 6 (six) hours as needed.   docusate sodium  (COLACE) 100 MG capsule Take 1 capsule (100 mg total) by mouth 2 (two) times daily.   empagliflozin  (JARDIANCE ) 10 MG TABS tablet Take 1 tablet (10 mg total) by mouth daily before breakfast.   Evolocumab  (REPATHA  SURECLICK) 140 MG/ML SOAJ Inject 140 mg into the skin every 14 (fourteen) days.   FLUoxetine (PROZAC) 20 MG capsule TAKE ONE CAPSULE BY MOUTH EVERY DAY   hydrocortisone  (CORTEF ) 20 MG tablet Take 1 tablet (20 mg total) by mouth daily.   hydrocortisone  sodium succinate  (SOLU-CORTEF ) 100 MG injection Inject 1 mL (50 mg total) into the muscle as directed as needed for 30 days.   levETIRAcetam (KEPPRA) 500 MG tablet Take 1 tablet (500 mg total) by mouth 2 (two) times daily for 7 days.   metoprolol  succinate (TOPROL -XL) 100 MG 24 hr tablet TAKE 1 TABLET BY MOUTH ONCE A DAY. TAKE WITH OR IMMEDIATELY FOLLOWING A MEAL   ondansetron  (ZOFRAN -ODT) 4 MG disintegrating tablet Take 1 tablet (4 mg total) by mouth every 8 (eight) hours as needed for nausea or vomiting.   pantoprazole  (PROTONIX ) 40 MG tablet Take 1 tablet (40 mg total) by mouth daily.   predniSONE  (DELTASONE ) 10 MG tablet 3 tabs by mouth per day for 3 days,2tabs per day for 3 days,1tab per day for 3 days   promethazine -dextromethorphan  (PROMETHAZINE -DM) 6.25-15 MG/5ML syrup Take 5 mLs by mouth 4 (four) times daily as needed.   sacubitril -valsartan  (ENTRESTO ) 24-26 MG Take 1 tablet by mouth 2 (two) times daily.   spironolactone  (ALDACTONE ) 25 MG tablet Take 0.5 tablets (12.5 mg total) by mouth daily.    Allergies:   Patient has no known allergies.   Social History   Socioeconomic History   Marital status: Married    Spouse name: Not on file   Number of children: Not on file   Years of education: Not on file   Highest education level: Not on file  Occupational History   Occupation: sports administrator  Tobacco Use   Smoking status: Every Day    Current packs/day: 0.25    Average packs/day: 0.3 packs/day for 40.0 years (10.0 ttl pk-yrs)    Types: Cigarettes   Smokeless tobacco: Never   Tobacco comments:    Restarted in March 2021  Vaping Use   Vaping status: Never Used  Substance and Sexual Activity   Alcohol  use: Yes    Alcohol /week: 21.0 standard drinks  of alcohol     Types: 21 Standard drinks or equivalent per week    Comment: 3 Bourbon daily   Drug use: No   Sexual activity: Not on file  Other Topics Concern   Not on file  Social History Narrative   Not on file   Social Drivers of Health   Financial Resource Strain: Not on file  Food Insecurity: No Food Insecurity (03/05/2024)   Hunger Vital Sign    Worried About Running Out of Food in the Last Year: Never true    Ran Out of Food in the Last Year: Never true  Transportation Needs: No Transportation Needs (03/05/2024)   PRAPARE - Administrator, Civil Service (Medical): No    Lack of Transportation (Non-Medical): No  Physical Activity: Not on file  Stress: Not on file  Social Connections: Moderately Integrated (03/02/2024)   Social Connection and Isolation Panel    Frequency of Communication with Friends and Family: More than three times a week    Frequency of Social Gatherings with Friends and Family: Once a week    Attends Religious  Services: 1 to 4 times per year    Active Member of [employer] or Organizations: No    Attends Banker Meetings: Never    Marital Status: Married     Family History:  The patient's family history includes Heart attack in his father. There is no history of Colon cancer, Esophageal cancer, Rectal cancer, or Stomach cancer.  ROS:   12-point review of systems is negative unless otherwise noted in the HPI.  EKGs/Other Studies Reviewed:    Studies reviewed were summarized above. The additional studies were reviewed today:  05/2022 Echo limited 1. Left ventricular ejection fraction, by estimation, is 40 to 45%. The  left ventricle has mildly decreased function. The left ventricle  demonstrates global hypokinesis. The average left ventricular global  longitudinal strain is -15.8 %.   2. Right ventricular systolic function is normal. The right ventricular  size is normal.   3. The mitral valve is normal in structure. No evidence of mitral valve  regurgitation. No evidence of mitral stenosis.   4. The aortic valve is normal in structure. Aortic valve regurgitation is  mild. No aortic stenosis is present.   5. The inferior vena cava is normal in size with greater than 50%  respiratory variability, suggesting right atrial pressure of 3 mmHg.   EKG:  EKG personally reviewed by me today EKG Interpretation Date/Time:  Tuesday September 18 2024 14:15:54 EDT Ventricular Rate:  104 PR Interval:  150 QRS Duration:  96 QT Interval:  336 QTC Calculation: 441 R Axis:   -66  Text Interpretation: Sinus tachycardia Left axis deviation Incomplete right bundle branch block Nonspecific ST and T wave abnormality Confirmed by Lorene Sinclair ([id number]) on 09/18/2024 2:19:57 PM  PHYSICAL EXAM:    VS:  BP 112/79 (BP Location: Left Arm, Patient Position: Sitting, Cuff Size: Normal)   Pulse (!) 104 Comment: 117 oximeter  Ht 6' 2 (1.88 m)   Wt 209 lb 6.4 oz (95 kg)   SpO2 96%   BMI 26.89 kg/m   BMI:  Body mass index is 26.89 kg/m.  GEN: Well nourished, well developed in no acute distress NECK: No JVD; No carotid bruits CARDIAC: RRR, no murmurs, rubs, gallops RESPIRATORY:  Clear to auscultation without rales, wheezing or rhonchi  ABDOMEN: Soft, non-tender, non-distended EXTREMITIES: No edema; No deformity  Wt Readings from Last 3 Encounters:  09/18/24 209  lb 6.4 oz (95 kg)  09/12/24 217 lb 8 oz (98.7 kg)  09/11/24 185 lb (83.9 kg)        Orthostatic VS for the past 24 hrs (Last 3 readings):  BP- Lying Pulse- Lying BP- Sitting Pulse- Sitting BP- Standing at 0 minutes Pulse- Standing at 0 minutes BP- Standing at 3 minutes Pulse- Standing at 3 minutes  09/18/24 1357 101/73 102 96/68 110 (!) 86/62 112 98/81 122           ASSESSMENT & PLAN:   Recurrent syncope - Patient reports 3 months of worsening lightheadedness with several episodes of syncope.  Most recently for which she went to the ED 10/21 after a syncopal episode during which he hit his head suffering an intracerebral hemorrhage.  Orthostatics obtained today with blood pressure dropping to 86/62 on standing.  Suspect syncope is secondary to orthostasis possibly complicated by post concussion syndrome.  Encouraged patient to increase oral hydration.  Recommend obtaining 2-week ZIO AT to assess for arrhythmia and echocardiogram to assess structure.  Given low BP, recommend discontinuing Entresto .  Advised patient to avoid driving for at least 6 months after last syncopal episode per [place]  [employer].  Discussed ED return precautions.  Encouraged patient to follow-up with neurosurgery.  Tachycardia - Patient appears to have a longstanding history of high resting heart rate for which he was initially started on metoprolol  which has been up titrated over time.  This was recently reduced to 50 mg daily per PCP.  Continue to monitor and consider resuming prior dose as blood pressure improves.   HFmrEF - Most recent echo 05/2022  showed EF 40 to 45% with global hypokinesis and mild AI.  He appears euvolemic on exam and has not requiring standing diuretic.  Repeat echo as above.  Discontinue Entresto  given low BP.  He is continued on Jardiance  10 mg daily, metoprolol  50 mg daily, and spironolactone  12.5 mg daily.  If BP remains low at follow-up, may need to consider de-escalating GDMT.  Dilated ascending aorta - Stable at 4.1 cm on CT angiogram 04/2023.  Update echo as above.  Hyperlipidemia - Most recent lipid panel 03/2024 with LDL 86.  He was recently started on Byetta by PCP.  Hypertension - BP low.  Discontinue Entresto .  Alcohol  use Tobacco use - Recommend complete cessation    Disposition: Obtain echo and 2-week ZIO AT.  Discontinue Entresto .  Monitor BP closely.  F/u with Dr. Darron or an APP in 6 to 8 weeks/after testing.     Medication Adjustments/Labs and Tests Ordered: Current medicines are reviewed at length with the patient today.  Concerns regarding medicines are outlined above. Medication changes, Labs and Tests ordered today are summarized above and listed in the Patient Instructions accessible in Encounters.   Signed, Lesley Maffucci, PA-C 09/18/2024 2:20 PM     [facility] [place] [address] [place], [place] [address] [phone]

## 2024-09-20 ENCOUNTER — Ambulatory Visit
Admission: RE | Admit: 2024-09-20 | Discharge: 2024-09-20 | Disposition: A | Discharge status: Home / Self Care | Source: Ambulatory Visit | Attending: Internal Medicine | Admitting: Internal Medicine

## 2024-09-20 VITALS — BP 114/86 | HR 100 | Temp 99.2°F | Resp 18

## 2024-09-20 DIAGNOSIS — Z4802 Encounter for removal of sutures: Secondary | ICD-10-CM

## 2024-09-20 NOTE — Discharge Instructions (Addendum)
 Declined avs

## 2024-09-20 NOTE — ED Triage Notes (Signed)
 Patient here for suture removal.

## 2024-09-20 NOTE — ED Provider Notes (Signed)
 [facility]    CSN: [id number] Arrival date & time: 09/20/24  9073      History   Chief Complaint Chief Complaint  Patient presents with   Suture / Staple Removal    HPI ERYC BODEY is a 68 y.o. male.   Patient presents for suture removal for laceration to the right cheek that occurred on 09/11/2024.  Placed in the emergency department.  Denies signs of infection.  Past Medical History:  Diagnosis Date   Adenomatous polyps 11/17/2011   Alcohol  use 11/17/2011   ALLERGIC RHINITIS 09/10/2007   ANXIETY 09/10/2007   BACK PAIN 12/31/2010   CHF (congestive heart failure) (HCC)    DISC DISEASE, LUMBAR 12/31/2010   GERD (gastroesophageal reflux disease) 11/23/2016   History of kidney cancer    Hyperglycemia 11/23/2016   HYPERLIPIDEMIA 12/31/2010   Hypertension    Personal history of rectal adenomas 01/29/2008   PSORIASIS 12/31/2010   Psoriatic arthritis (HCC)    PULMONARY EMBOLISM, HX OF 09/10/2007   RASH-NONVESICULAR 04/23/2009   Renal mass    SMOKER 12/31/2010    Patient Active Problem List   Diagnosis Date Noted   Syncope 09/12/2024   Laceration of face without complication 09/12/2024   Acute upper respiratory infection 09/12/2024   Traumatic intracerebral hemorrhage with loss of consciousness of 30 minutes or less (HCC) 09/12/2024   Facial laceration 09/12/2024   Allergic rhinitis 07/12/2024   Adrenal insufficiency (Addison's disease) (HCC) 03/02/2024   Chronic HFrEF (heart failure with reduced ejection fraction) (HCC)    Abnormal LFTs 01/14/2020   Right lower quadrant abdominal pain 01/14/2020   Low back pain 07/12/2018   Complication of Foley catheter 01/10/2018   Acute blood loss anemia 01/10/2018   Primary osteoarthritis of left hip 09/09/2017   Hypertension    Chest pain 11/28/2016   GERD (gastroesophageal reflux disease) 11/23/2016   Hyperglycemia 11/23/2016   Psoriatic arthritis (HCC) 09/30/2015   Chronic low back pain 09/26/2014    Encounter for well adult exam with abnormal findings 11/17/2011   Alcohol  use 11/17/2011   HLD (hyperlipidemia) 12/31/2010   SMOKER 12/31/2010   Psoriasis 12/31/2010   DISC DISEASE, LUMBAR 12/31/2010   Backache 12/31/2010   History of colonic polyps 01/29/2008   Anxiety state 09/10/2007   INSOMNIA 09/10/2007   PULMONARY EMBOLISM, HX OF 09/10/2007    Past Surgical History:  Procedure Laterality Date   LYMPH NODE DISSECTION Bilateral 08/27/2020   Procedure: RETROPERITONEAL LYMPH NODE DISSECTION;  Surgeon: Alvaro Hummer, MD;  Location: [facility];  Service: Urology;  Laterality: Bilateral;   RIGHT/LEFT HEART CATH AND CORONARY ANGIOGRAPHY N/A 12/15/2021   Procedure: RIGHT/LEFT HEART CATH AND CORONARY ANGIOGRAPHY;  Surgeon: Darron Deatrice DELENA, MD;  Location: [facility];  Service: Cardiovascular;  Laterality: N/A;   ROBOT ASSISTED LAPAROSCOPIC NEPHRECTOMY Left 08/27/2020   Procedure: XI ROBOTIC ASSISTED LAPAROSCOPIC RADICAL NEPHRECTOMY;  Surgeon: Alvaro Hummer, MD;  Location: [facility];  Service: Urology;  Laterality: Left;  3.5 HRS   ROBOTIC ADRENALECTOMY Right 07/07/2022   Procedure: XI ROBOTIC PARTIAL ADRENALECTOMY, INTRAOPERATIVE ULTRASOUND;  Surgeon: Alvaro Hummer, MD;  Location: [facility];  Service: Urology;  Laterality: Right;   ROTATOR CUFF REPAIR  Jan, 2010   [facility], Left   spleen repair     TOTAL HIP ARTHROPLASTY Left 09/09/2017   Procedure: LEFT TOTAL HIP ARTHROPLASTY ANTERIOR APPROACH;  Surgeon: Yvone Rush, MD;  Location: [facility];  Service: Orthopedics;  Laterality: Left;   WISDOM TOOTH EXTRACTION  Home Medications    Prior to Admission medications   Medication Sig Start Date End Date Taking? Authorizing Provider  acetaminophen  (TYLENOL ) 500 MG tablet Take 2 tablets (1,000 mg total) by mouth every 6 (six) hours as needed. 09/12/24 09/12/25  Clarine Ozell LABOR, MD  docusate sodium  (COLACE) 100 MG capsule Take 1 capsule (100 mg total) by mouth 2 (two) times daily.  07/07/22   Cory Palma, PA-C  empagliflozin  (JARDIANCE ) 10 MG TABS tablet Take 1 tablet (10 mg total) by mouth daily before breakfast. 05/22/24   Furth, Cadence H, PA-C  Evolocumab  (REPATHA  SURECLICK) 140 MG/ML SOAJ Inject 140 mg into the skin every 14 (fourteen) days. 09/12/24   Norleen Lynwood ORN, MD  FLUoxetine (PROZAC) 20 MG capsule TAKE ONE CAPSULE BY MOUTH EVERY DAY 07/13/24   Norleen Lynwood ORN, MD  hydrocortisone  (CORTEF ) 20 MG tablet Take 1 tablet (20 mg total) by mouth daily. 07/12/24   Norleen Lynwood ORN, MD  hydrocortisone  sodium succinate  (SOLU-CORTEF ) 100 MG injection Inject 1 mL (50 mg total) into the muscle as directed as needed for 30 days. 03/21/24     levETIRAcetam (KEPPRA) 500 MG tablet Take 1 tablet (500 mg total) by mouth 2 (two) times daily for 7 days. 09/12/24 09/19/24  Clarine Ozell LABOR, MD  metoprolol  succinate (TOPROL -XL) 100 MG 24 hr tablet TAKE 1 TABLET BY MOUTH ONCE A DAY. TAKE WITH OR IMMEDIATELY FOLLOWING A MEAL 05/22/24   Furth, Cadence H, PA-C  ondansetron  (ZOFRAN -ODT) 4 MG disintegrating tablet Take 1 tablet (4 mg total) by mouth every 8 (eight) hours as needed for nausea or vomiting. 02/29/24   Clarine Ozell LABOR, MD  pantoprazole  (PROTONIX ) 40 MG tablet Take 1 tablet (40 mg total) by mouth daily. 09/04/24   Darron Deatrice LABOR, MD  predniSONE  (DELTASONE ) 10 MG tablet 3 tabs by mouth per day for 3 days,2tabs per day for 3 days,1tab per day for 3 days 07/12/24   Norleen Lynwood ORN, MD  promethazine -dextromethorphan (PROMETHAZINE -DM) 6.25-15 MG/5ML syrup Take 5 mLs by mouth 4 (four) times daily as needed. 09/12/24   Norleen Lynwood ORN, MD  spironolactone  (ALDACTONE ) 25 MG tablet Take 0.5 tablets (12.5 mg total) by mouth daily. 05/22/24   Furth, Cadence H, PA-C    Family History Family History  Problem Relation Age of Onset   Heart attack Father    Colon cancer Neg Hx    Esophageal cancer Neg Hx    Rectal cancer Neg Hx    Stomach cancer Neg Hx     Social History Social History   Tobacco Use    Smoking status: Every Day    Current packs/day: 0.25    Average packs/day: 0.3 packs/day for 40.0 years (10.0 ttl pk-yrs)    Types: Cigarettes   Smokeless tobacco: Never   Tobacco comments:    Restarted in March 2021  Vaping Use   Vaping status: Never Used  Substance Use Topics   Alcohol  use: Yes    Alcohol /week: 21.0 standard drinks of alcohol     Types: 21 Standard drinks or equivalent per week    Comment: 3 Bourbon daily   Drug use: No     Allergies   Patient has no known allergies.   Review of Systems Review of Systems   Physical Exam Triage Vital Signs ED Triage Vitals  Encounter Vitals Group     BP 09/20/24 1011 114/86     Girls Systolic BP Percentile --      Girls Diastolic BP Percentile --  Boys Systolic BP Percentile --      Boys Diastolic BP Percentile --      Pulse Rate 09/20/24 1011 100     Resp 09/20/24 1011 18     Temp 09/20/24 1011 99.2 F (37.3 C)     Temp Source 09/20/24 1011 Oral     SpO2 09/20/24 1009 96 %     Weight --      Height --      Head Circumference --      Peak Flow --      Pain Score 09/20/24 1009 0     Pain Loc --      Pain Education --      Exclude from Growth Chart --    No data found.  Updated Vital Signs BP 114/86 (BP Location: Left Arm)   Pulse 100   Temp 99.2 F (37.3 C) (Oral)   Resp 18   SpO2 96%   Visual Acuity Right Eye Distance:   Left Eye Distance:   Bilateral Distance:    Right Eye Near:   Left Eye Near:    Bilateral Near:     Physical Exam Constitutional:      Appearance: Normal appearance.  Eyes:     Extraocular Movements: Extraocular movements intact.  Pulmonary:     Effort: Pulmonary effort is normal.  Skin:    Comments: Scabbed laceration present to the right cheek, 3 sutures present  Neurological:     Mental Status: He is alert and oriented to person, place, and time. Mental status is at baseline.      UC Treatments / Results  Labs (all labs ordered are listed, but only abnormal  results are displayed) Labs Reviewed - No data to display  EKG   Radiology No results found.  Procedures Procedures (including critical care time)  Medications Ordered in UC Medications - No data to display  Initial Impression / Assessment and Plan / UC Course  I have reviewed the triage vital signs and the nursing notes.  Pertinent labs & imaging results that were available during my care of the patient were reviewed by me and considered in my medical decision making (see chart for details).  Encounter for removal of sutures  3 sutures placed by the emergency department, removed in clinic today, no signs of infection, does not require further follow-up Final Clinical Impressions(s) / UC Diagnoses   Final diagnoses:  Encounter for removal of sutures     Discharge Instructions      Declined avs   ED Prescriptions   None    PDMP not reviewed this encounter.   Teresa Shelba SAUNDERS, NP 09/20/24 1018

## 2024-09-24 ENCOUNTER — Telehealth: Payer: Self-pay | Admitting: Neurosurgery

## 2024-09-24 DIAGNOSIS — S06361A Traumatic hemorrhage of cerebrum, unspecified, with loss of consciousness of 30 minutes or less, initial encounter: Secondary | ICD-10-CM

## 2024-09-24 NOTE — Telephone Encounter (Signed)
 Randy Walker ORN, MD  P [facility]Neurosurgery Admin [place] or [place], repeat head CT 2 to 4 weeks  Please order imaging, thanks!

## 2024-09-24 NOTE — Telephone Encounter (Signed)
 ordered

## 2024-09-26 ENCOUNTER — Other Ambulatory Visit: Payer: Self-pay | Admitting: Internal Medicine

## 2024-09-26 ENCOUNTER — Ambulatory Visit

## 2024-10-16 ENCOUNTER — Other Ambulatory Visit: Payer: Self-pay | Admitting: Internal Medicine

## 2024-10-16 ENCOUNTER — Other Ambulatory Visit: Payer: Self-pay

## 2024-10-17 NOTE — Telephone Encounter (Signed)
 Called patient to follow up on the CT scan that was not scheduled yet and he has follow up on 10/23/24. Patient stated he did not feel like it was necessary to get another scan and do all these things to spend money and run his insurance. He does not feel like he has a brain bleed, I went over the CT scan, and advised that we can advise on the steps but can not force him to do anything that he does not want to do. Patient stated he does not think all of this is necessary and that he has been pocked and probed enough lately. He wanted to cancel appointment with us . Patient was not upset he just did not want to go through any of this work up at this time.  FYI to [place] as he was scheduled on her schedule. Appointment cancelled.

## 2024-10-19 DIAGNOSIS — R55 Syncope and collapse: Secondary | ICD-10-CM

## 2024-10-22 ENCOUNTER — Ambulatory Visit: Payer: Self-pay | Admitting: Physician Assistant

## 2024-10-23 ENCOUNTER — Ambulatory Visit: Admitting: Physician Assistant

## 2024-10-26 ENCOUNTER — Encounter: Payer: Self-pay | Admitting: Emergency Medicine

## 2024-11-01 NOTE — Progress Notes (Signed)
 Cardiology Office Note    Date:  11/07/2024   ID:  Randy Walker, DOB Mar 09, 1956, MRN [id number]  PCP:  Norleen Lynwood ORN, MD  Cardiologist:  Deatrice Cage, MD  Electrophysiologist:  None   Chief Complaint: Follow up  History of Present Illness:   Randy Walker is a 68 y.o. 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  Calcium score in 08/2021, 29 which was a 49th percentile.  Imaging also demonstrated mild dilatation of the ascending aorta, measuring 43 mm.  In this setting he was evaluated by Dr. Cage in 09/2021.  Echo in 10/2021 demonstrated an EF of 40 to 45%, global hypokinesis, mildly dilated LV internal cavity size, grade 2 diastolic dysfunction, normal RV systolic function and ventricular cavity size, mild mitral regurgitation, tricuspid aortic valve with mild to moderate insufficiency, aortic valve sclerosis without evidence of stenosis, mild dilatation of the aortic root measuring 43 mm, mild dilatation of the ascending aorta measuring 44 mm, and normal CVP.  R/LHC in 11/2021 showed normal coronary arteries with mildly reduced LV systolic function with an EF of 45 to 50%, normal filling pressures, normal pulmonary pressure, and normal cardiac output.  Following escalation of GDMT, he underwent limited echo in 05/2022 that demonstrated an EF of 40 to 45%, global hypokinesis, normal RV systolic function and ventricular cavity size, mild aortic insufficiency, and normal CVP.  CTA of the aorta in 04/2023 showed a 4.1 cm ascending thoracic aortic aneurysm that was stable when compared to prior imaging.  Also incidentally noted were numerous bilateral  pulmonary nodules measuring 6 mm or less.  He was evaluated in the ER in 02/2024 with adrenal insufficiency and on chronic steroids.  He was seen in the ER in 08/2024 with syncope complicated by intracranial hemorrhage.  CT head on 09/11/2024 showed focus of acute hemorrhage within the body of the corpus callosum just to the right of midline.  Repeat CT head 24 hours later showed unchanged acute intraparenchymal hemorrhage in the corpus callosum body.  High-sensitivity troponin negative x 2.  EKG showed sinus rhythm with diffuse nonspecific ST-T changes.  He was started on Keppra  with recommendation to follow-up with neurosurgery.  He followed up with his PCP on 09/12/2024 with reported fever, facial pain, headache, and generalized weakness with malaise with mild to moderate cough.  Blood pressure was soft at 98/64 including metoprolol  to be reduced to 50 mg daily.  He was diagnosed with an upper respiratory infection.  He followed up with cardiology on 09/18/2024 multiple syncopal episodes and lightheadedness.  He reported a 2 to 39-month history of worsening lightheadedness, particularly with positional changes as well as in 3 episodes of syncope in the preceding month.  He reported LOC of up to 10 minutes.  He reported progressive dyspnea on exertion without frank chest pain.  Orthostatics in the office at that time showed the blood pressure dropping to 86/62 with standing.  Entresto  was discontinued.  Zio patch in 08/2024 showed a predominant rhythm of sinus with an average rate of 81 bpm, 1 episode of NSVT lasting 8 beats, 1 run of SVT lasting 7 beats, rare atrial and ventricular ectopy, and patient triggered events that did not correlate with  arrhythmia.  Echo in 10/2024 demonstrated an EF of 60 to 65%, no regional wall motion abnormalities, grade 1 diastolic dysfunction, normal RV systolic function and ventricular cavity size, moderate aortic insufficiency, normal size and structure aortic root, and normal  CVP.  He comes in today and is doing well from a cardiac perspective, without symptoms of angina or cardiac decompensation.  He does feel like he has had a slight progression in shortness of breath over the past 12 months.  No lower extremity swelling or progressive orthopnea.  No further syncopal episodes.  Notes an improvement in orthostasis.  No falls or symptoms concerning for bleeding.  Did not follow-up with neurosurgery after hospital admission.  Feels like he is doing well from a cardiac perspective and does not have any acute cardiac concerns at this time.   Labs independently reviewed: 08/2024 - high-sensitivity troponin negative x 2, Hgb 13.9, PLT 149, potassium 4.4, BUN 19, serum creatinine 1.2, albumin  3.2, AST/ALT normal 03/2024 - TC 194, TG 153, HDL 82, LDL 86 02/2024 - A1c 5.5 09/2021 - TSH normal  Past Medical History:  Diagnosis Date   Adenomatous polyps 11/17/2011   Alcohol  use 11/17/2011   ALLERGIC RHINITIS 09/10/2007   ANXIETY 09/10/2007   BACK PAIN 12/31/2010   CHF (congestive heart failure) (HCC)    DISC DISEASE, LUMBAR 12/31/2010   GERD (gastroesophageal reflux disease) 11/23/2016   History of kidney cancer    Hyperglycemia 11/23/2016   HYPERLIPIDEMIA 12/31/2010   Hypertension    Personal history of rectal adenomas 01/29/2008   PSORIASIS 12/31/2010   Psoriatic arthritis (HCC)    PULMONARY EMBOLISM, HX OF 09/10/2007   RASH-NONVESICULAR 04/23/2009   Renal mass    SMOKER 12/31/2010    Past Surgical History:  Procedure Laterality Date   LYMPH NODE DISSECTION Bilateral 08/27/2020   Procedure: RETROPERITONEAL LYMPH NODE DISSECTION;  Surgeon: Alvaro Hummer, MD;  Location: [facility];  Service: Urology;  Laterality: Bilateral;   RIGHT/LEFT HEART CATH AND CORONARY ANGIOGRAPHY N/A 12/15/2021   Procedure: RIGHT/LEFT HEART CATH AND CORONARY ANGIOGRAPHY;  Surgeon: Darron Deatrice LABOR, MD;  Location: [facility];  Service: Cardiovascular;  Laterality: N/A;   ROBOT  ASSISTED LAPAROSCOPIC NEPHRECTOMY Left 08/27/2020   Procedure: XI ROBOTIC ASSISTED LAPAROSCOPIC RADICAL NEPHRECTOMY;  Surgeon: Alvaro Hummer, MD;  Location: [facility];  Service: Urology;  Laterality: Left;  3.5 HRS   ROBOTIC ADRENALECTOMY Right 07/07/2022   Procedure: XI ROBOTIC PARTIAL ADRENALECTOMY, INTRAOPERATIVE ULTRASOUND;  Surgeon: Alvaro Hummer, MD;  Location: [facility];  Service: Urology;  Laterality: Right;   ROTATOR CUFF REPAIR  Jan, 2010   [facility], Left   spleen repair     TOTAL HIP ARTHROPLASTY Left 09/09/2017   Procedure: LEFT TOTAL HIP ARTHROPLASTY ANTERIOR APPROACH;  Surgeon: Yvone Rush, MD;  Location: [facility];  Service: Orthopedics;  Laterality: Left;   WISDOM TOOTH EXTRACTION      Current Medications: Active Medications[1]  Allergies:   Patient has no known allergies.   Social History   Socioeconomic History   Marital status: Married    Spouse name: Not on file   Number of children: Not on file   Years of education: Not on file   Highest education level: Not on file  Occupational History   Occupation: sports administrator  Tobacco Use   Smoking status: Every Day    Current packs/day: 0.25    Average packs/day: 0.3 packs/day for 40.0 years (10.0 ttl pk-yrs)    Types: Cigarettes   Smokeless tobacco: Never  Tobacco comments:    Restarted in March 2021  Vaping Use   Vaping status: Never Used  Substance and Sexual Activity   Alcohol  use: Yes    Alcohol /week: 21.0 standard drinks of alcohol     Types: 21 Standard drinks or equivalent per week    Comment: 3 Bourbon daily   Drug use: No   Sexual activity: Not on file  Other Topics Concern   Not on file  Social History Narrative   Not on file   Social Drivers of Health   Tobacco Use: High Risk (11/07/2024)   Patient History    Smoking Tobacco Use: Every Day    Smokeless Tobacco Use: Never    Passive Exposure: Not on file  Financial Resource Strain: Not on file  Food Insecurity: No Food Insecurity  (03/05/2024)   Hunger Vital Sign    Worried About Running Out of Food in the Last Year: Never true    Ran Out of Food in the Last Year: Never true  Transportation Needs: No Transportation Needs (03/05/2024)   PRAPARE - Administrator, Civil Service (Medical): No    Lack of Transportation (Non-Medical): No  Physical Activity: Not on file  Stress: Not on file  Social Connections: Moderately Integrated (03/02/2024)   Social Connection and Isolation Panel    Frequency of Communication with Friends and Family: More than three times a week    Frequency of Social Gatherings with Friends and Family: Once a week    Attends Religious Services: 1 to 4 times per year    Active Member of [employer] or Organizations: No    Attends Banker Meetings: Never    Marital Status: Married  Depression (PHQ2-9): Low Risk (03/06/2024)   Depression (PHQ2-9)    PHQ-2 Score: 0  Alcohol  Screen: Not on file  Housing: Low Risk (03/05/2024)   Housing Stability Vital Sign    Unable to Pay for Housing in the Last Year: No    Number of Times Moved in the Last Year: 0    Homeless in the Last Year: No  Utilities: Not At Risk (03/05/2024)   AHC Utilities    Threatened with loss of utilities: No  Health Literacy: Not on file     Family History:  The patient's family history includes Heart attack in his father. There is no history of Colon cancer, Esophageal cancer, Rectal cancer, or Stomach cancer.  ROS:   12-point review of systems is negative unless otherwise noted in the HPI.   EKGs/Labs/Other Studies Reviewed:    Studies reviewed were summarized above. The additional studies were reviewed today:  Zio patch 08/2024: Patient had a min HR of 55 bpm, max HR of 222 bpm, and avg HR of 81 bpm. Predominant underlying rhythm was Sinus Rhythm.  1 run of Ventricular Tachycardia occurred lasting 8 beats with a max rate of 222 bpm (avg 199 bpm).  No reported symptoms with this. 1 run of Supraventricular  Tachycardia occurred lasting 7 beats with a max rate of 115 bpm (avg 98 bpm).  Rare PACs and rare PVCs. 4 triggered events did not correlate with arrhythmia. __________  Limited echo 05/27/2022: 1. Left ventricular ejection fraction, by estimation, is 40 to 45%. The  left ventricle has mildly decreased function. The left ventricle  demonstrates global hypokinesis. The average left ventricular global  longitudinal strain is -15.8 %.   2. Right ventricular systolic function is normal. The right ventricular  size is normal.   3.  The mitral valve is normal in structure. No evidence of mitral valve  regurgitation. No evidence of mitral stenosis.   4. The aortic valve is normal in structure. Aortic valve regurgitation is  mild. No aortic stenosis is present.   5. The inferior vena cava is normal in size with greater than 50%  respiratory variability, suggesting right atrial pressure of 3 mmHg.   Comparison(s): Heart cath reported LVEF of 45-50% in 11/2021.  __________  [facility] 12/15/2021:   There is mild left ventricular systolic dysfunction.   LV end diastolic pressure is normal.   The left ventricular ejection fraction is 45-50% by visual estimate.   1.  Normal coronary arteries. 2.  Mildly reduced LV systolic function with an EF of 45%. 3.  Right heart catheterization showed normal filling pressures, normal pulmonary pressure and normal cardiac output.   Recommendations: The patient has mild nonischemic cardiomyopathy.  Recommend continuing medical therapy.  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  Maximize the dose of Toprol  as tolerated. __________  2D echo 11/12/2021: 1. Left ventricular ejection fraction, by estimation, is 40 to 45%. The  left ventricle has mildly decreased function. The left ventricle  demonstrates global hypokinesis. The left ventricular internal cavity size  was mildly dilated. Left ventricular  diastolic parameters are consistent with Grade II diastolic  dysfunction  (pseudonormalization).   2. Right ventricular systolic function is normal. The right ventricular  size is normal.   3. The mitral valve is normal in structure. Mild mitral valve  regurgitation. No evidence of mitral stenosis.   4. The aortic valve is tricuspid. Aortic valve regurgitation is mild to  moderate. Aortic valve sclerosis is present, with no evidence of aortic  valve stenosis.   5. There is mild dilatation of the aortic root, measuring 43 mm. There is  mild dilatation of the ascending aorta, measuring 44 mm.   6. The inferior vena cava is normal in size with greater than 50%  respiratory variability, suggesting right atrial pressure of 3 mmHg.  __________  Calcium score 09/14/2021: IMPRESSION: 1. Coronary calcium score of 29. This was 40th percentile for age-, race-, and sex-matched controls. 2.  Mild dilation of ascending aorta on noncontrast images, 43 mm.   Noncardiac overread: IMPRESSION: 1. Mild aneurysmal dilatation of the ascending thoracic aorta up to approximately 4.2 cm. Consider correlation with echocardiography. Recommend annual imaging followup by CTA or MRA. 2. Emphysematous lung disease. 3. 5 mm fissural nodule associated with the anterior minor fissure which is most likely benign/postinflammatory. No routine follow-up imaging is recommended per Fleischner Society Guidelines. These guidelines do not apply to immunocompromised patients and patients with cancer. Follow up in patients with significant comorbidities as clinically warranted.  4. Hepatic steatosis with possible cirrhotic disease.    EKG:  EKG is not ordered today.    Recent Labs: 09/11/2024: ALT 21; BUN 19; Creatinine, Ser 1.20; Hemoglobin 13.9; Platelets 149; Potassium 4.4; Sodium 131  Recent Lipid Panel    Component Value Date/Time   CHOL 170 10/06/2021 1443   TRIG 127 10/06/2021 1443   HDL 55 10/06/2021 1443   CHOLHDL 3.1 10/06/2021 1443   CHOLHDL 3 10/07/2020 1604    VLDL 54.6 (H) 10/07/2020 1604   LDLCALC 93 10/06/2021 1443   LDLDIRECT 90.0 10/07/2020 1604    PHYSICAL EXAM:    VS:  BP (!) 136/96   Pulse 95   Ht 6' 2 (1.88 m)   Wt 212 lb (96.2 kg)   SpO2 94%   BMI  27.22 kg/m   BMI: Body mass index is 27.22 kg/m.  Physical Exam Vitals reviewed.  Constitutional:      Appearance: He is well-developed.  HENT:     Head: Normocephalic and atraumatic.  Eyes:     General:        Right eye: No discharge.        Left eye: No discharge.  Cardiovascular:     Rate and Rhythm: Normal rate and regular rhythm.     Heart sounds: Normal heart sounds, S1 normal and S2 normal. Heart sounds not distant. No midsystolic click and no opening snap. No murmur heard.    No friction rub.  Pulmonary:     Effort: Pulmonary effort is normal. No respiratory distress.     Breath sounds: Normal breath sounds. No decreased breath sounds, wheezing, rhonchi or rales.  Musculoskeletal:     Cervical back: Normal range of motion.     Right lower leg: No edema.     Left lower leg: No edema.  Skin:    General: Skin is warm and dry.     Nails: There is no clubbing.  Neurological:     Mental Status: He is alert and oriented to person, place, and time.  Psychiatric:        Speech: Speech normal.        Behavior: Behavior normal.        Thought Content: Thought content normal.        Judgment: Judgment normal.     Wt Readings from Last 3 Encounters:  11/07/24 212 lb (96.2 kg)  09/18/24 209 lb 6.4 oz (95 kg)  09/12/24 217 lb 8 oz (98.7 kg)     ASSESSMENT & PLAN:   Syncope with intracranial hemorrhage: No further episodes.  Suspected to be orthostatic in etiology complicated by postconcussion syndrome.  Patient indicates he has not followed up with neurosurgery.  He was advised to do so.  Cardiac workup has been reassuring including Zio patch and echo.  Unlikely primary ACS event given cardiac cath in 2023 showed normal coronary arteries.  He declines nuclear  stress testing.  He was again advised of [place] guidelines to refrain from driving for 6 months from the date of most recent syncopal episode or until identifiable cause has been adequately treated.  HFimpEF: Euvolemic and well compensated with NYHA class I symptoms.  Remains on Jardiance  10 mg, Toprol -XL 100 mg, and spironolactone  12.5 mg.  Not requiring a standing loop diuretic.  Most recent echo demonstrated normalization of LV systolic function.  Ascending thoracic aortic aneurysm: Stable ascending thoracic aortic aneurysm measuring 4.1 cm by CT in 02/2024 with recommendation for annual imaging.  Optimal blood pressure control is recommended.  Complete smoking cessation.  Aortic atherosclerosis/HLD: LDL 86 in 03/2024.  Now on Repatha  which will be refilled.  Pulmonary nodules with ongoing tobacco use: Incidentally noted by CTA of the chest/aorta in 04/2023.  Given the relatively quick development, these were favored to be inflammatory with follow-up imaging recommended.  CT of the chest in 02/2024 showed stable 6 mm nodule in the right upper lobe and 4 mm nodule in the right middle lobe with no new suspicious or enlarging pulmonary nodules noted.  Follow-up with PCP to determine if further surveillance is indicated.  He feels like his shortness of breath is more pulmonary in etiology with possible COPD.  He declines nuclear stress testing.  Advised to follow-up with PCP.  Abnormal CT findings: Multiple incidental findings noted on  imaging during admission in 02/2024 with recommendation for patient to follow-up with PCP.  Patient was made aware of these findings and recommendation today.    Disposition: F/u with Dr. Darron in 6 months.   Medication Adjustments/Labs and Tests Ordered: Current medicines are reviewed at length with the patient today.  Concerns regarding medicines are outlined above. Medication changes, Labs and Tests ordered today are summarized above and listed in the Patient Instructions  accessible in Encounters.   Signed, Bernardino Bring, PA-C 11/07/2024 12:48 PM     [facility] [place] [address] [place], [place] [address] [phone]     [1]  Current Meds  Medication Sig   docusate sodium  (COLACE) 100 MG capsule Take 1 capsule (100 mg total) by mouth 2 (two) times daily.   empagliflozin  (JARDIANCE ) 10 MG TABS tablet Take 1 tablet (10 mg total) by mouth daily before breakfast.   FLUoxetine (PROZAC) 20 MG capsule TAKE ONE CAPSULE BY MOUTH EVERY DAY   hydrocortisone  (CORTEF ) 20 MG tablet Take 1 tablet (20 mg total) by mouth daily.   hydrocortisone  sodium succinate  (SOLU-CORTEF ) 100 MG injection Inject 1 mL (50 mg total) into the muscle as directed as needed for 30 days.   levETIRAcetam  (KEPPRA ) 500 MG tablet TAKE 1 TABLET (500 MG TOTAL) BY MOUTH 2  TIMES DAILY.   metoprolol  succinate (TOPROL -XL) 100 MG 24 hr tablet TAKE 1 TABLET BY MOUTH ONCE A DAY. TAKE WITH OR IMMEDIATELY FOLLOWING A MEAL   pantoprazole  (PROTONIX ) 40 MG tablet Take 1 tablet (40 mg total) by mouth daily.   spironolactone  (ALDACTONE ) 25 MG tablet Take 0.5 tablets (12.5 mg total) by mouth daily.   [DISCONTINUED] Evolocumab  (REPATHA  SURECLICK) 140 MG/ML SOAJ Inject 140 mg into the skin every 14 (fourteen) days.

## 2024-11-05 ENCOUNTER — Ambulatory Visit: Attending: Physician Assistant

## 2024-11-05 DIAGNOSIS — R55 Syncope and collapse: Secondary | ICD-10-CM

## 2024-11-05 LAB — ECHOCARDIOGRAM COMPLETE
AV Mean grad: 3 mmHg
AV Peak grad: 5.5 mmHg
Ao pk vel: 1.17 m/s

## 2024-11-07 ENCOUNTER — Other Ambulatory Visit: Payer: Self-pay | Admitting: Internal Medicine

## 2024-11-07 ENCOUNTER — Encounter: Payer: Self-pay | Admitting: Physician Assistant

## 2024-11-07 ENCOUNTER — Ambulatory Visit: Admitting: Physician Assistant

## 2024-11-07 VITALS — BP 136/96 | HR 95 | Ht 74.0 in | Wt 212.0 lb

## 2024-11-07 DIAGNOSIS — I700 Atherosclerosis of aorta: Secondary | ICD-10-CM

## 2024-11-07 DIAGNOSIS — R911 Solitary pulmonary nodule: Secondary | ICD-10-CM

## 2024-11-07 DIAGNOSIS — E785 Hyperlipidemia, unspecified: Secondary | ICD-10-CM | POA: Diagnosis not present

## 2024-11-07 DIAGNOSIS — Z758 Other problems related to medical facilities and other health care: Secondary | ICD-10-CM | POA: Diagnosis not present

## 2024-11-07 DIAGNOSIS — R935 Abnormal findings on diagnostic imaging of other abdominal regions, including retroperitoneum: Secondary | ICD-10-CM

## 2024-11-07 DIAGNOSIS — I629 Nontraumatic intracranial hemorrhage, unspecified: Secondary | ICD-10-CM | POA: Diagnosis not present

## 2024-11-07 DIAGNOSIS — I77810 Thoracic aortic ectasia: Secondary | ICD-10-CM | POA: Diagnosis not present

## 2024-11-07 DIAGNOSIS — I5020 Unspecified systolic (congestive) heart failure: Secondary | ICD-10-CM

## 2024-11-07 DIAGNOSIS — R55 Syncope and collapse: Secondary | ICD-10-CM | POA: Diagnosis not present

## 2024-11-07 MED ORDER — REPATHA SURECLICK 140 MG/ML ~~LOC~~ SOAJ: 140.0000 mg | SUBCUTANEOUS | 3 refills | Status: AC

## 2024-11-07 NOTE — Patient Instructions (Signed)
 Medication Instructions:  Your physician recommends that you continue on your current medications as directed. Please refer to the Current Medication list given to you today.   *If you need a refill on your cardiac medications before your next appointment, please call your pharmacy*  Lab Work: None ordered at this time   Follow-Up: At [facility], you and your health needs are our priority.  As part of our continuing mission to provide you with exceptional heart care, our providers are all part of one team.  This team includes your primary Cardiologist (physician) and Advanced Practice Providers or APPs (Physician Assistants and Nurse Practitioners) who all work together to provide you with the care you need, when you need it.  Your next appointment:   6 month(s)  Provider:   You may see Deatrice Cage, MD    We recommend signing up for the patient portal called MyChart.  Sign up information is provided on this After Visit Summary.  MyChart is used to connect with patients for Virtual Visits (Telemedicine).  Patients are able to view lab/test results, encounter notes, upcoming appointments, etc.  Non-urgent messages can be sent to your provider as well.   To learn more about what you can do with MyChart, go to [email or web address].

## 2024-11-30 NOTE — Progress Notes (Signed)
 Letter sent 1/926
